# Patient Record
Sex: Male | Born: 1970
Health system: Southern US, Community
[De-identification: ages and names within clinical notes are randomized; demographics above are authoritative.]

## PROBLEM LIST (undated history)

## (undated) DIAGNOSIS — E119 Type 2 diabetes mellitus without complications: Secondary | ICD-10-CM

## (undated) DIAGNOSIS — K2270 Barrett's esophagus without dysplasia: Secondary | ICD-10-CM

## (undated) DIAGNOSIS — M1990 Unspecified osteoarthritis, unspecified site: Secondary | ICD-10-CM

## (undated) DIAGNOSIS — F32A Depression, unspecified: Secondary | ICD-10-CM

## (undated) DIAGNOSIS — R7303 Prediabetes: Secondary | ICD-10-CM

## (undated) DIAGNOSIS — F419 Anxiety disorder, unspecified: Secondary | ICD-10-CM

## (undated) DIAGNOSIS — E785 Hyperlipidemia, unspecified: Secondary | ICD-10-CM

## (undated) HISTORY — DX: Prediabetes: R73.03

## (undated) HISTORY — DX: Barrett's esophagus without dysplasia: K22.70

## (undated) HISTORY — DX: Type 2 diabetes mellitus without complications: E11.9

## (undated) HISTORY — DX: Anxiety disorder, unspecified: F41.9

## (undated) HISTORY — DX: Unspecified osteoarthritis, unspecified site: M19.90

## (undated) HISTORY — DX: Hyperlipidemia, unspecified: E78.5

## (undated) HISTORY — DX: Depression, unspecified: F32.A

## (undated) HISTORY — PX: LITHOTRIPSY: SUR834

---

## 2001-04-08 ENCOUNTER — Encounter: Payer: Self-pay | Admitting: Internal Medicine

## 2001-04-08 ENCOUNTER — Emergency Department (HOSPITAL_COMMUNITY): Admission: EM | Admit: 2001-04-08 | Discharge: 2001-04-08 | Payer: Self-pay | Admitting: Internal Medicine

## 2005-12-23 ENCOUNTER — Emergency Department: Payer: Self-pay | Admitting: Emergency Medicine

## 2006-08-24 HISTORY — PX: COLONOSCOPY: SHX174

## 2006-11-22 ENCOUNTER — Ambulatory Visit: Payer: Self-pay | Admitting: Gastroenterology

## 2007-06-28 ENCOUNTER — Ambulatory Visit: Payer: Self-pay

## 2008-08-24 HISTORY — PX: ESOPHAGOGASTRODUODENOSCOPY: SHX1529

## 2008-08-24 LAB — HM COLONOSCOPY

## 2008-11-16 ENCOUNTER — Ambulatory Visit: Payer: Self-pay | Admitting: Gastroenterology

## 2011-10-06 ENCOUNTER — Emergency Department: Payer: Self-pay | Admitting: Emergency Medicine

## 2011-10-06 LAB — CBC
HCT: 48.9 % (ref 40.0–52.0)
HGB: 16.9 g/dL (ref 13.0–18.0)
MCH: 32.3 pg (ref 26.0–34.0)
MCHC: 34.5 g/dL (ref 32.0–36.0)
MCV: 94 fL (ref 80–100)
Platelet: 151 10*3/uL (ref 150–440)
RBC: 5.21 10*6/uL (ref 4.40–5.90)
RDW: 12.5 % (ref 11.5–14.5)
WBC: 9.8 10*3/uL (ref 3.8–10.6)

## 2011-10-06 LAB — BASIC METABOLIC PANEL
Anion Gap: 6 — ABNORMAL LOW (ref 7–16)
BUN: 14 mg/dL (ref 7–18)
Calcium, Total: 9.3 mg/dL (ref 8.5–10.1)
Chloride: 104 mmol/L (ref 98–107)
Co2: 27 mmol/L (ref 21–32)
Creatinine: 0.79 mg/dL (ref 0.60–1.30)
EGFR (African American): >60
EGFR (Non-African Amer.): >60
Glucose: 95 mg/dL (ref 65–99)
Osmolality: 274 (ref 275–301)
Potassium: 4 mmol/L (ref 3.5–5.1)
Sodium: 137 mmol/L (ref 136–145)

## 2011-10-06 LAB — URINALYSIS, COMPLETE
Bacteria: NONE SEEN
Bilirubin,UR: NEGATIVE
Glucose,UR: NEGATIVE mg/dL (ref 0–75)
Ketone: NEGATIVE
Leukocyte Esterase: NEGATIVE
Nitrite: NEGATIVE
Ph: 6 (ref 4.5–8.0)
Protein: NEGATIVE
RBC,UR: 1153 /HPF (ref 0–5)
Specific Gravity: 1.019 (ref 1.003–1.030)
Squamous Epithelial: 1
WBC UR: 2 /HPF (ref 0–5)

## 2011-10-08 ENCOUNTER — Emergency Department: Payer: Self-pay | Admitting: Emergency Medicine

## 2011-10-08 LAB — BASIC METABOLIC PANEL
Anion Gap: 11 (ref 7–16)
BUN: 14 mg/dL (ref 7–18)
Calcium, Total: 9.1 mg/dL (ref 8.5–10.1)
Chloride: 104 mmol/L (ref 98–107)
Co2: 25 mmol/L (ref 21–32)
Creatinine: 0.92 mg/dL (ref 0.60–1.30)
EGFR (African American): >60
EGFR (Non-African Amer.): >60
Glucose: 127 mg/dL — ABNORMAL HIGH (ref 65–99)
Osmolality: 281 (ref 275–301)
Potassium: 3.7 mmol/L (ref 3.5–5.1)
Sodium: 140 mmol/L (ref 136–145)

## 2011-10-08 LAB — URINALYSIS, COMPLETE
Bacteria: NONE SEEN
Bilirubin,UR: NEGATIVE
Glucose,UR: NEGATIVE mg/dL (ref 0–75)
Ketone: NEGATIVE
Leukocyte Esterase: NEGATIVE
Nitrite: NEGATIVE
Ph: 6 (ref 4.5–8.0)
Protein: NEGATIVE
RBC,UR: 1 /HPF (ref 0–5)
Specific Gravity: 1.006 (ref 1.003–1.030)
Squamous Epithelial: NONE SEEN
WBC UR: 2 /HPF (ref 0–5)

## 2011-10-12 ENCOUNTER — Ambulatory Visit: Payer: Self-pay | Admitting: Urology

## 2011-10-26 ENCOUNTER — Ambulatory Visit: Payer: Self-pay | Admitting: Urology

## 2011-10-28 ENCOUNTER — Ambulatory Visit: Payer: Self-pay | Admitting: Urology

## 2013-03-11 IMAGING — CT CT STONE STUDY
1 of 2 series · 16 of 32 positions shown, 20 images · non-contrast
Comparison: none

REASON FOR EXAM: left flank pain
COMMENTS:

PROCEDURE:     CT  - CT ABDOMEN /PELVIS WO (STONE)  - October 06, 2011  [DATE]
RESULT:     History: Left flank pain.
Comparison Study: No prior.

[Series 2: stone · axial · 0.79mm/px · z∈[-1012,-562]mm · 16 of 162 slices shown, 20 images]
[im 6/162  soft-tissue]
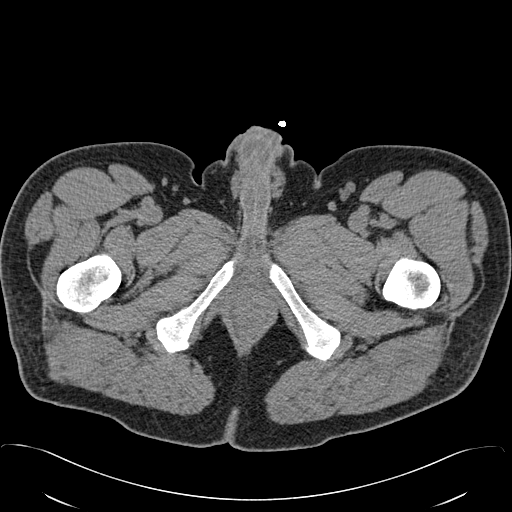
[im 6/162  bone]
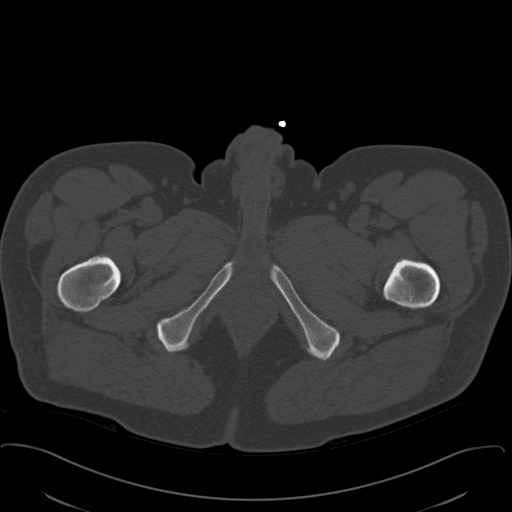
[im 18/162  soft-tissue]
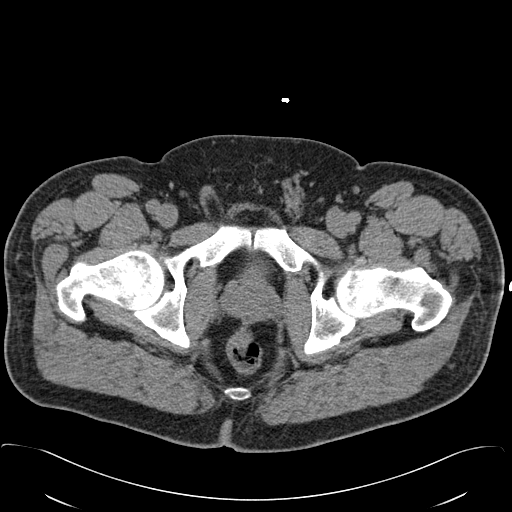
[im 30/162  soft-tissue]
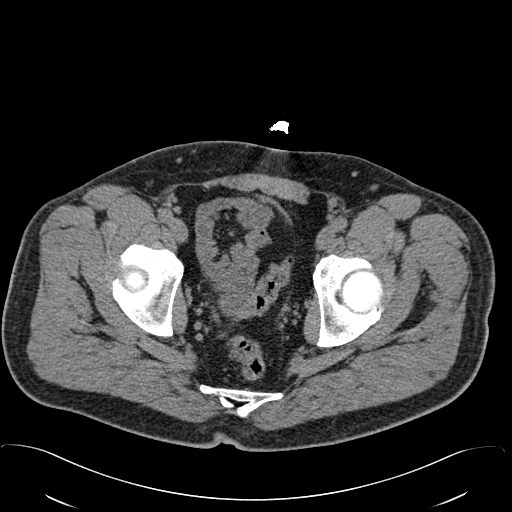
[im 42/162  soft-tissue]
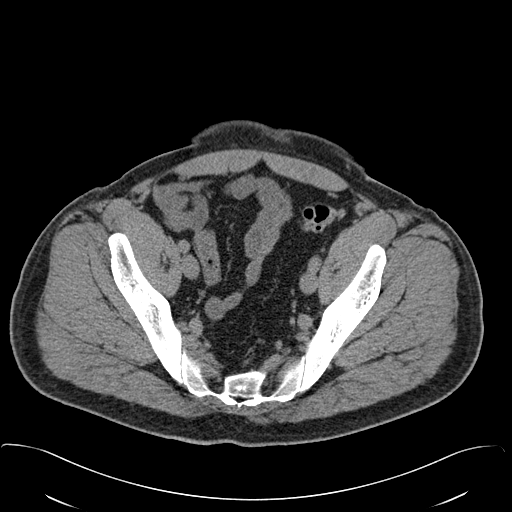
[im 54/162  soft-tissue]
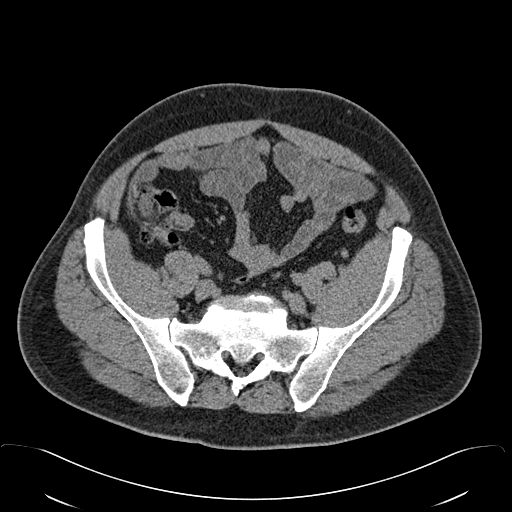
[im 66/162  soft-tissue]
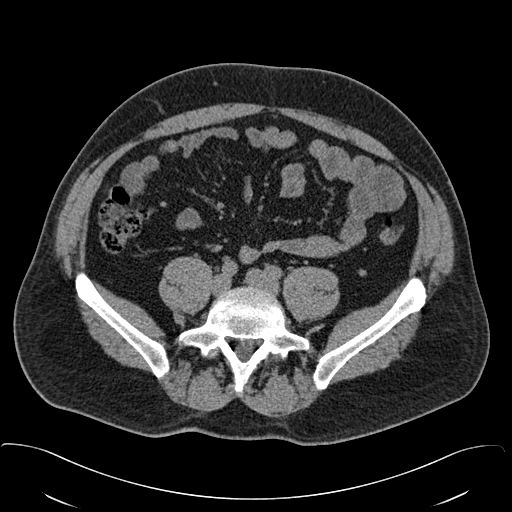
[im 78/162  soft-tissue]
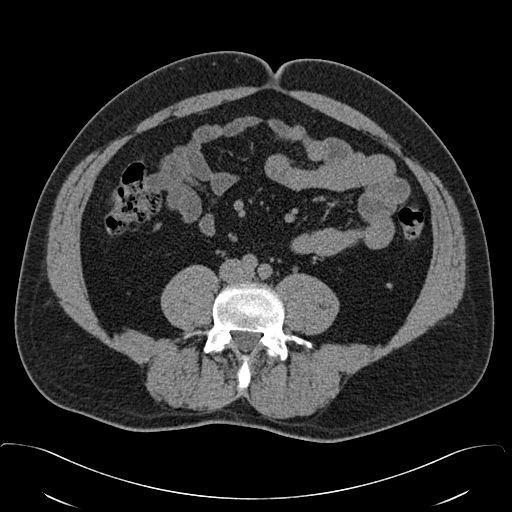
[im 84/162  soft-tissue]
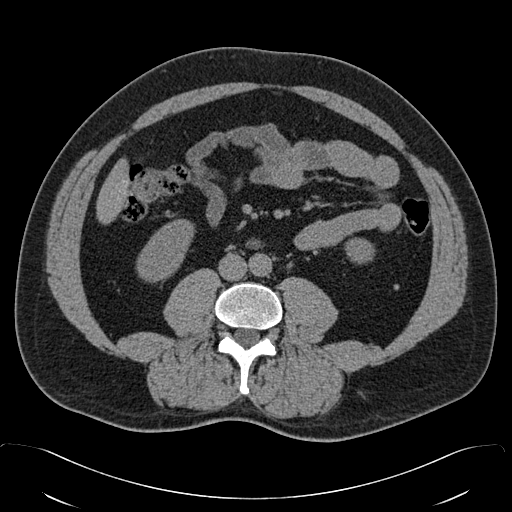
[im 96/162  soft-tissue]
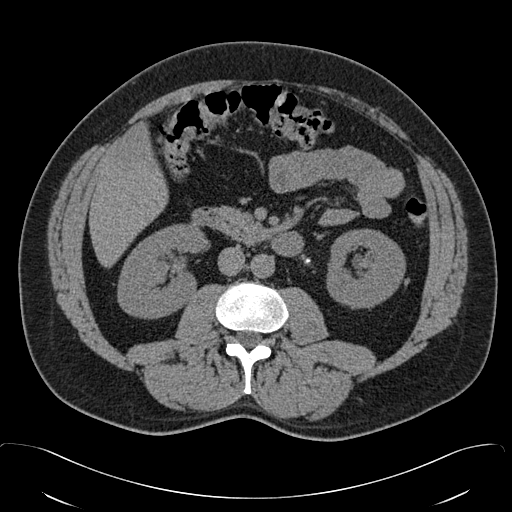
[im 96/162  bone]
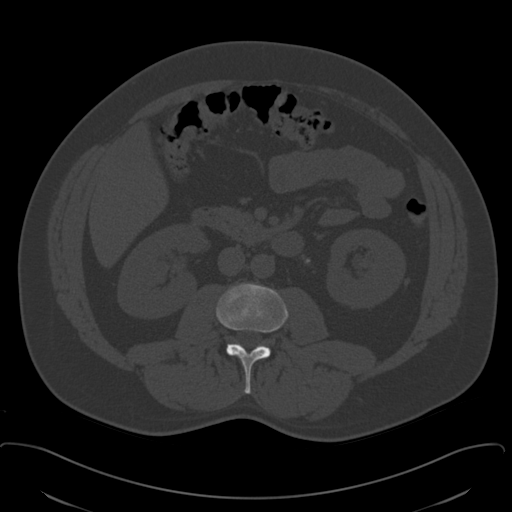
[im 108/162  soft-tissue]
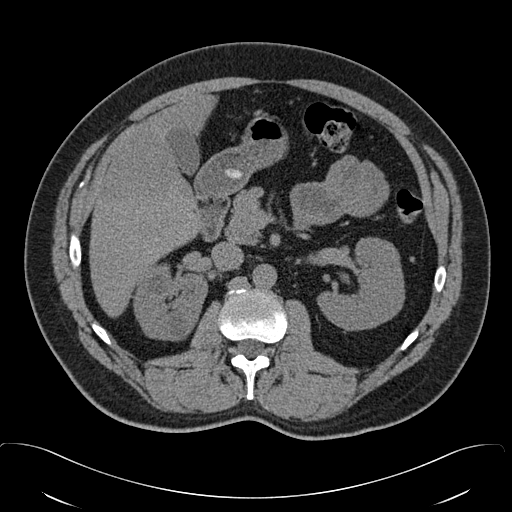
[im 120/162  soft-tissue]
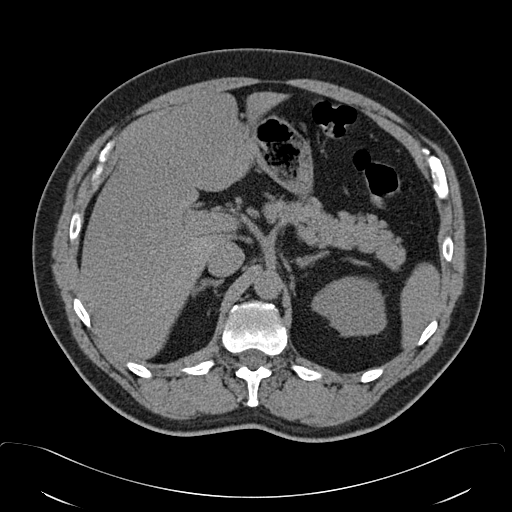
[im 132/162  soft-tissue]
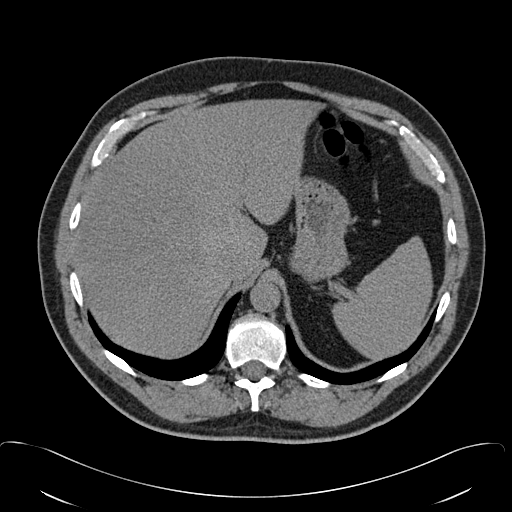
[im 138/162  lung]
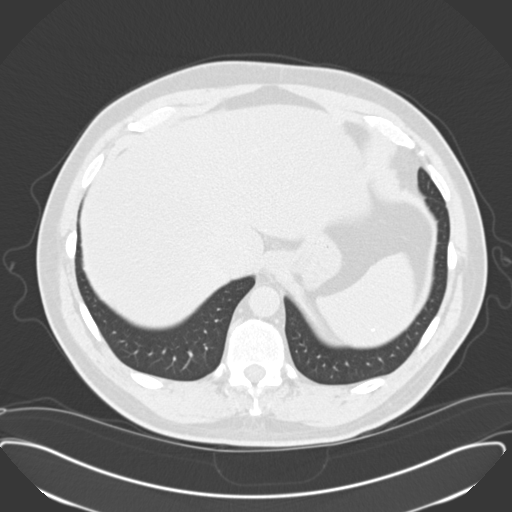
[im 144/162  soft-tissue]
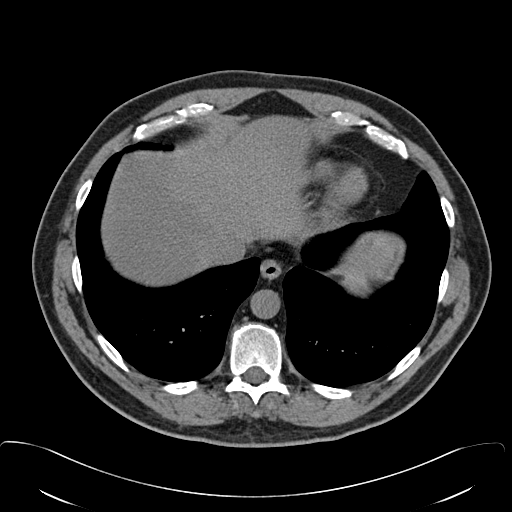
[im 144/162  lung]
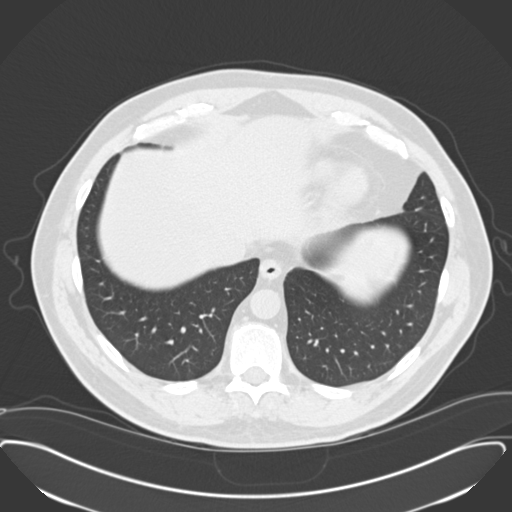
[im 150/162  lung]
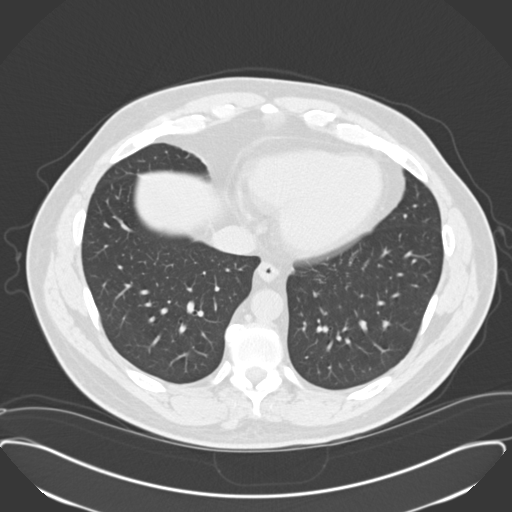
[im 156/162  soft-tissue]
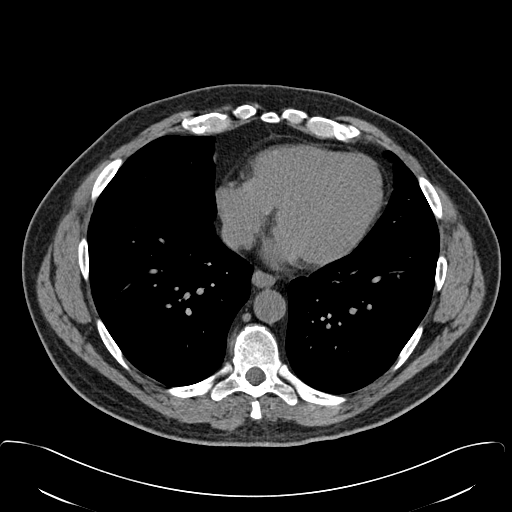
[im 156/162  lung]
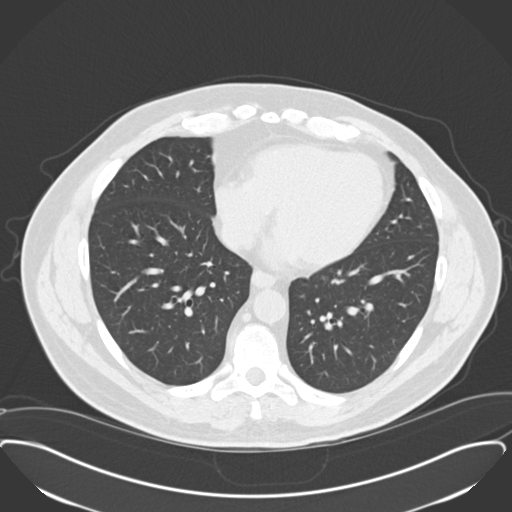

[16 of 32 positions shown; findings below may reference images not displayed]

FINDINGS: Standard nonenhanced CT obtained. Liver normal. Gallbladder
nondistended. Pancreas normal. Calcified splenic granulomas noted. Adrenals
normal. Simple renal cysts are noted. Cortical scarring with calcification
noted in the left kidney. Left nephrolithiasis present. Aorta nondistended.
2 mm stone in the left upper ureter is noted. There is mild left
hydronephrosis. No bowel distention noted. Appendix normal. Lung bases
clear. No free air. Calcified splenic granulomas. No bowel distention. Small
inguinal lymph nodes are noted. These are nonspecific.
IMPRESSION: Small 5 mm stone proximal left ureter with mild
hydronephrosis. Left nephrolithiasis. Bilateral renal cysts.

## 2013-03-31 IMAGING — CR DG ABDOMEN 1V
1 series · 2 of 2 positions shown · non-contrast
Comparison: none

REASON FOR EXAM: nephrolithasis
COMMENTS:

PROCEDURE:     DXR - DXR KIDNEY URETER BLADDER  - October 26, 2011  [DATE]
RESULT:     Comparison is made to the study 12 October, 2011.
No definite renal or ureteral calculi are appreciated. The bowel gas pattern
is unremarkable. The bony structures appear grossly normal.

[Series 4: t abdomen supine · 0.14mm/px · 2 of 2 slices shown]
[im 1/2]
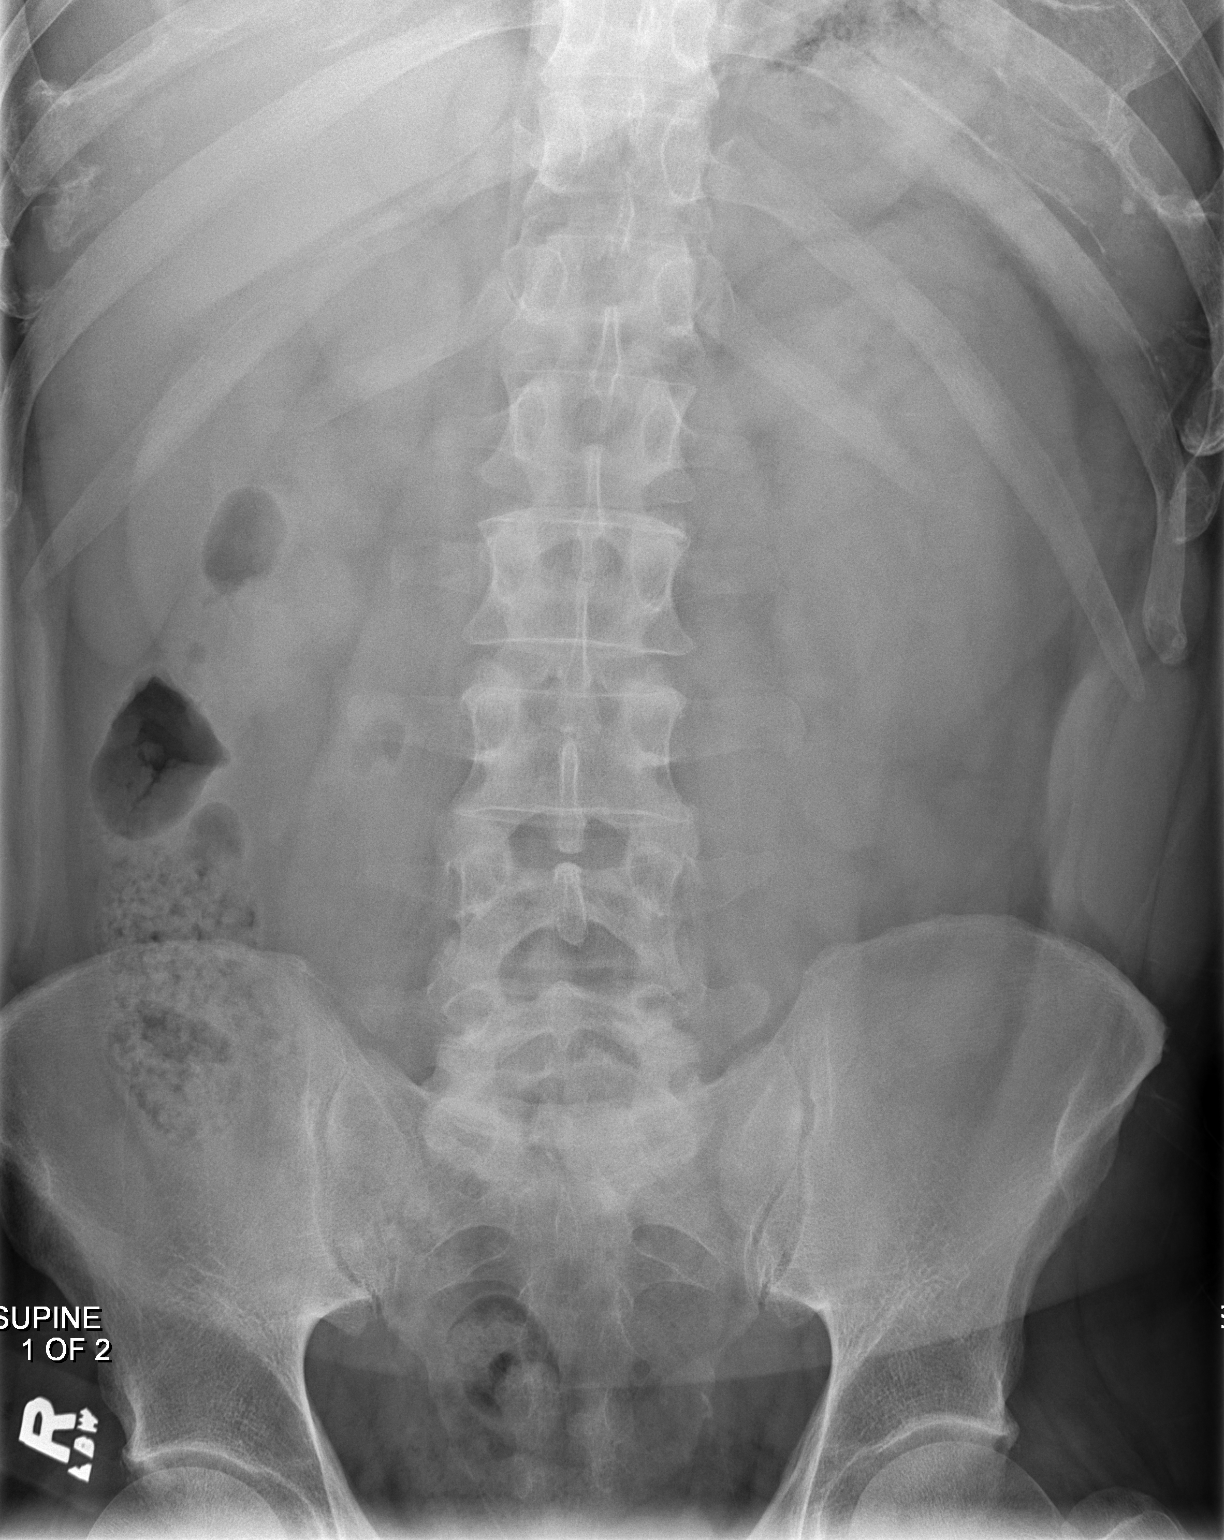
[im 2/2]
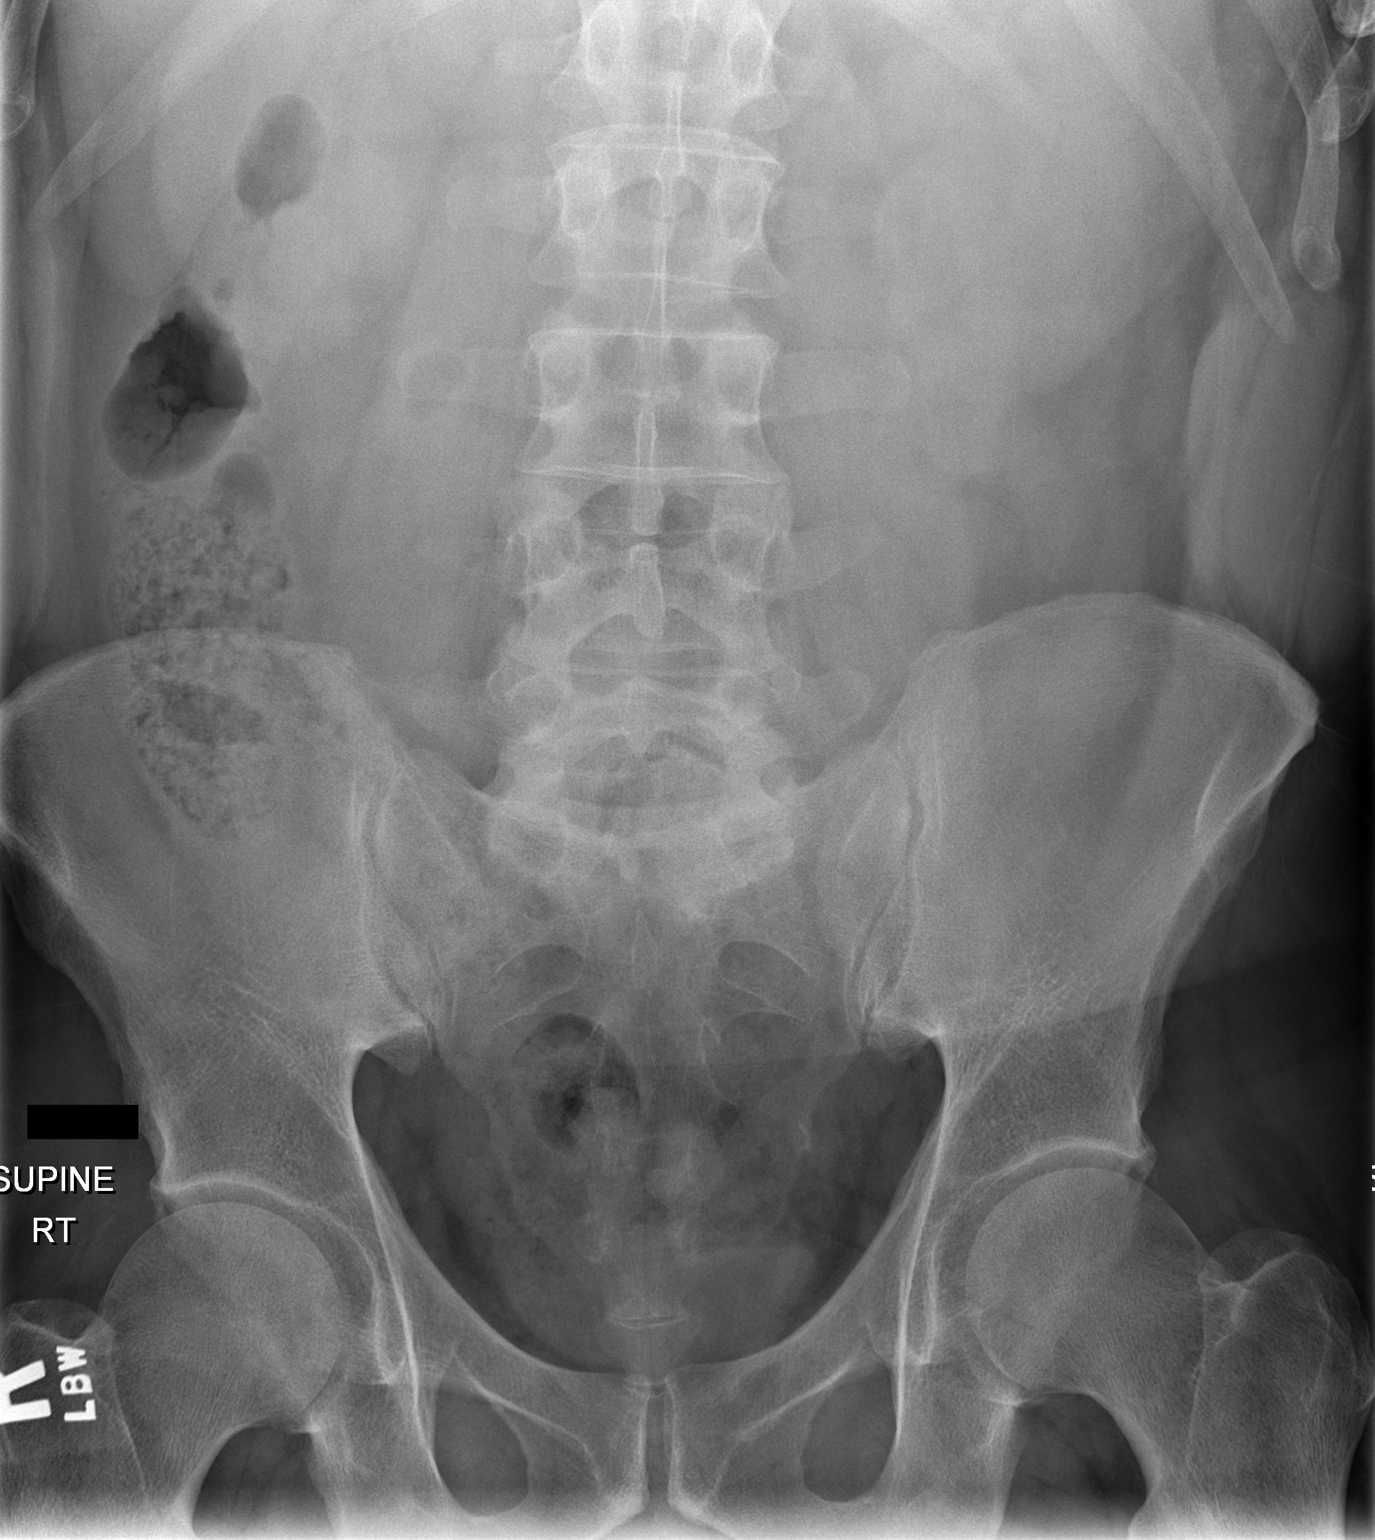

[2 of 2 positions shown; findings below may reference images not displayed]

IMPRESSION: 1. No evidence of urinary tract stones.

ADDENDUM:  10/29/2011- Ordering physician is Babey Diop, M.D.

## 2013-11-23 ENCOUNTER — Ambulatory Visit: Payer: Self-pay | Admitting: Internal Medicine

## 2014-11-26 LAB — PSA: PSA: 0.4

## 2014-12-16 NOTE — Op Note (Signed)
PATIENT NAME:  Karl Barrett, [name] MR#:  [id number] DATE OF BIRTH:  Sep 23, 1970  DATE OF PROCEDURE:  10/28/2011  PREOPERATIVE DIAGNOSES:  1. Left distal ureteral calculus.  2. Left renal colic.   POSTOPERATIVE DIAGNOSES:  1. Left distal ureteral calculus.  2. Left renal colic.   PROCEDURES:  1. Left ureteroscopy with laser lithotripsy/stone extraction.  2. Placement of left ureteral stent.   SURGEON: Irineo AxonScott Mayur Duman, M.D.   ASSISTANT: None.   ANESTHESIA: General.   INDICATIONS: This is a 44 year old male with approximately one month history of intermittent left flank pain. He has had increasing severe pain in the past 48 hours. The stone appears to have migrated to the distal ureter. After discussion of treatment options, he has elected ureteroscopic removal.   DESCRIPTION OF PROCEDURE: The patient was taken to the Operating Room where a general anesthetic was administered. He was placed in the low lithotomy position and his external genitalia were prepped and draped sterilely. A time out was performed and a 21 French cystoscope with 30 degree lens was lubricated and passed under direct vision. The urethra was normal in caliber without stricture. The prostate shows mild lateral lobe enlargement. The bladder mucosa was closely inspected and there was no erythema, solid or papillary lesions noted. There was clear efflux from the right ureteral orifice. No efflux was seen from the left ureteral orifice. A 0.035 guidewire was placed through the cystoscope and into the left ureteral orifice. The wire was negotiated past the stone and passed up the renal pelvis. The cystoscope was removed and a 6 French semirigid ureteroscope was lubricated and passed under direct vision. A second 0.025 guidewire was placed through the ureteroscope and into the left ureteral orifice and the scope was advanced over the wire. In the midportion of the distal ureter, a stone was identified and appeared to be embedded in a portion  of the mucosa with some surrounding ureteral edema. A 365 micron holmium laser fiber was placed through the ureteroscope. The stone was easily fragmented at a power of 6.4 watts. Total energy used was 0.3 kilojoules. Stone fragments were removed with a 3 [place] nitinol basket. There was mucosal edema. No ureteral perforation was noted. A 6 French/26 cm Bard inlay stent was placed. There was good curl seen in the renal pelvis on fluoroscopy. The distal end of the stent was well positioned in the bladder. The stent was left attached to a string and will be removed at a later date. A B and O suppository was placed per rectum. He was taken to PAC-U in stable condition. There were no complications. EBL was minimal.  ____________________________ Verna CzechScott C. Lonna CobbStoioff, MD scs:slb D: 10/28/2011 14:18:36 ET T: 10/28/2011 16:02:42 ET JOB#: [id number]  cc: Lorin PicketScott C. Lonna CobbStoioff, MD, <Dictator> Riki AltesSCOTT C Shaquanna Lycan MD ELECTRONICALLY SIGNED 11/02/2011 18:16

## 2015-02-19 ENCOUNTER — Encounter: Payer: Self-pay | Admitting: Internal Medicine

## 2015-02-19 DIAGNOSIS — K22719 Barrett's esophagus with dysplasia, unspecified: Secondary | ICD-10-CM | POA: Insufficient documentation

## 2015-02-19 DIAGNOSIS — K277 Chronic peptic ulcer, site unspecified, without hemorrhage or perforation: Secondary | ICD-10-CM | POA: Insufficient documentation

## 2015-02-19 DIAGNOSIS — G5600 Carpal tunnel syndrome, unspecified upper limb: Secondary | ICD-10-CM | POA: Insufficient documentation

## 2015-02-19 DIAGNOSIS — E118 Type 2 diabetes mellitus with unspecified complications: Secondary | ICD-10-CM | POA: Insufficient documentation

## 2015-02-19 DIAGNOSIS — M1710 Unilateral primary osteoarthritis, unspecified knee: Secondary | ICD-10-CM | POA: Insufficient documentation

## 2015-02-19 DIAGNOSIS — E1165 Type 2 diabetes mellitus with hyperglycemia: Secondary | ICD-10-CM

## 2015-02-19 DIAGNOSIS — A6000 Herpesviral infection of urogenital system, unspecified: Secondary | ICD-10-CM | POA: Insufficient documentation

## 2015-02-19 DIAGNOSIS — M179 Osteoarthritis of knee, unspecified: Secondary | ICD-10-CM | POA: Insufficient documentation

## 2015-04-09 ENCOUNTER — Ambulatory Visit: Payer: Self-pay | Admitting: Internal Medicine

## 2015-04-29 IMAGING — CR DG CHEST 2V
1 series · 2 of 2 positions shown · non-contrast
Comparison: None.

CLINICAL DATA: Cough

EXAM:
CHEST  2 VIEW

[Series 1: pa · 0.17mm/px · 2 of 2 slices shown]
[im 1/2]
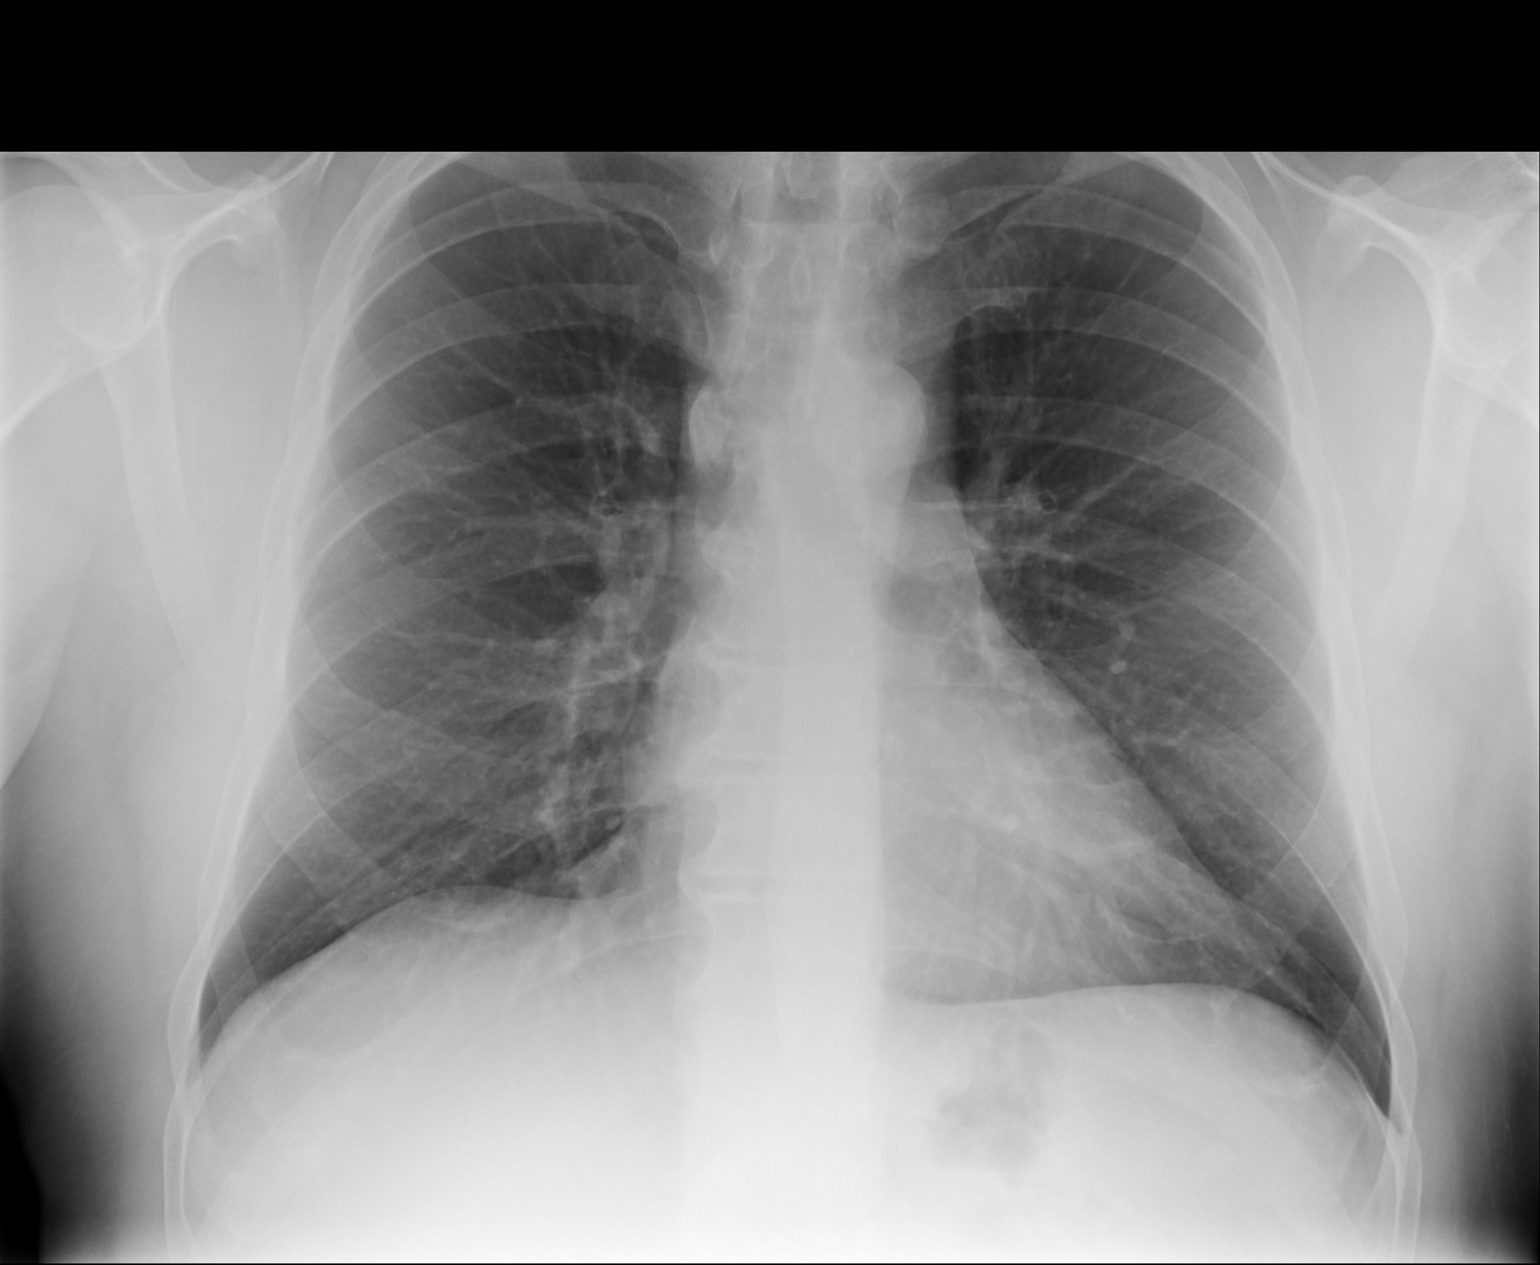
[im 2/2]
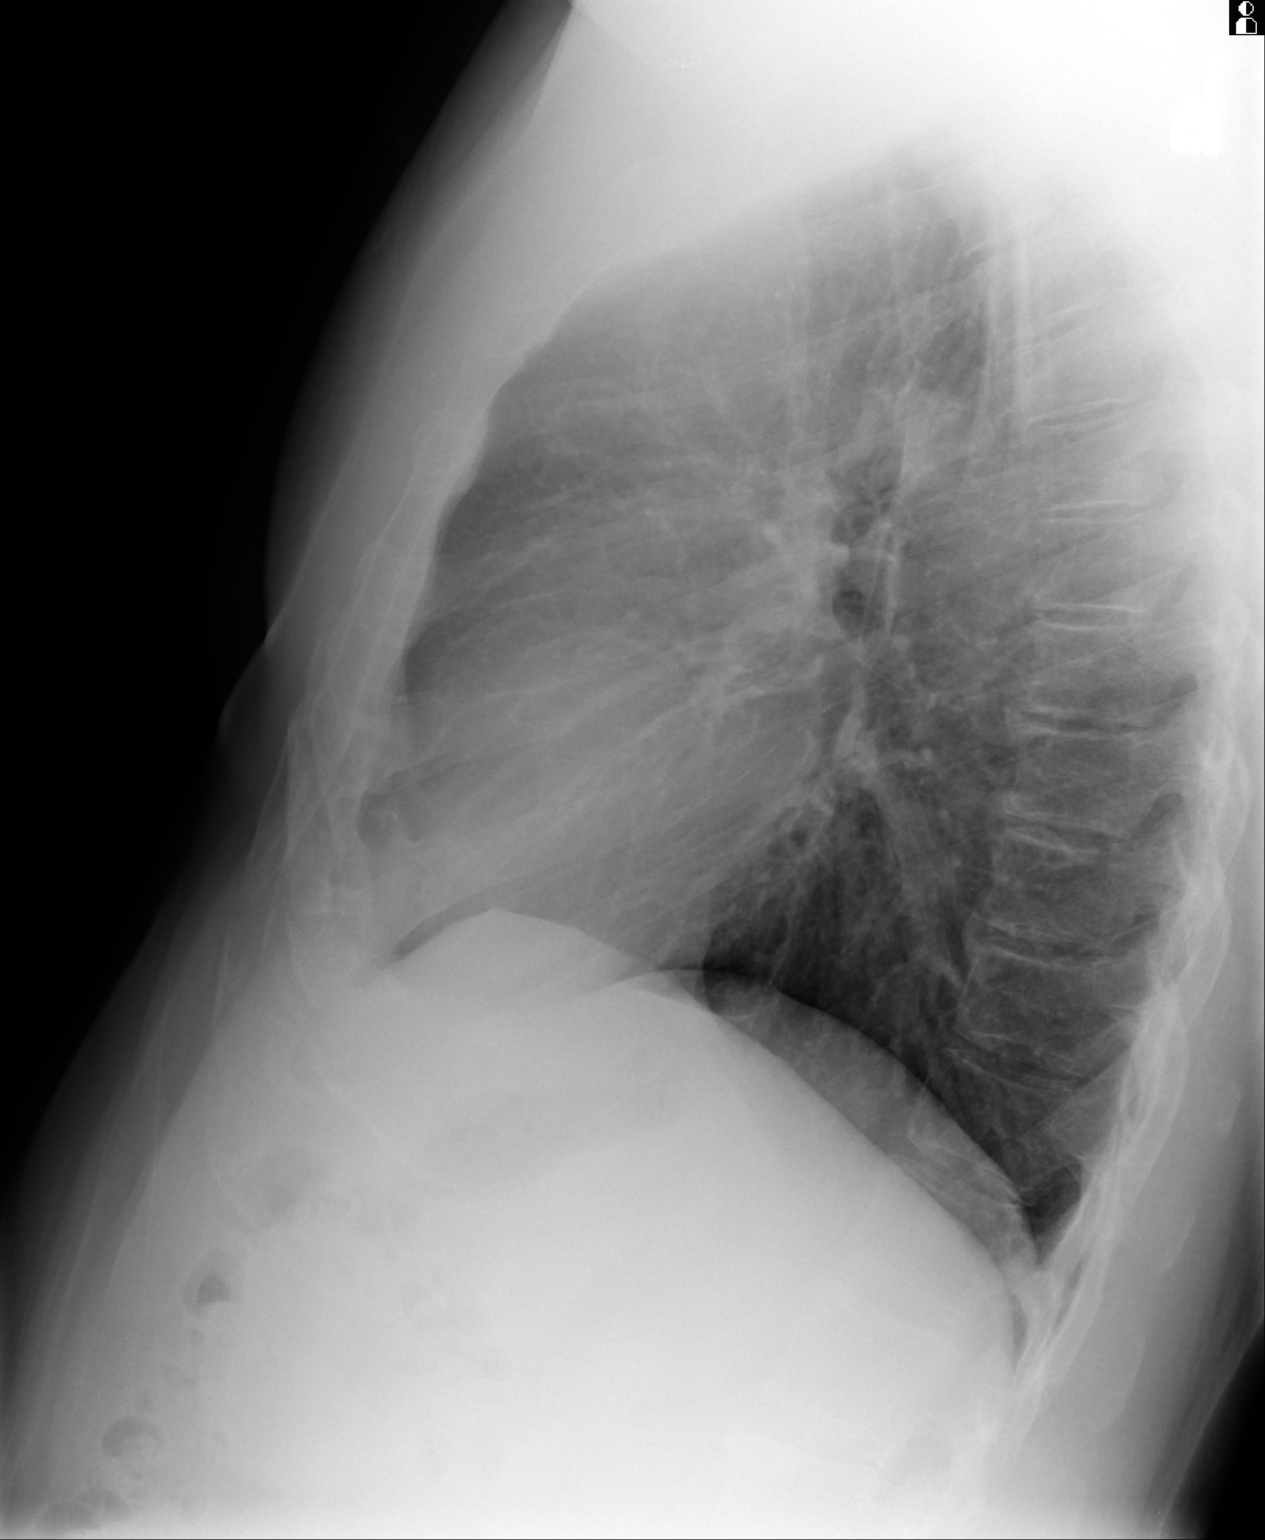

[2 of 2 positions shown; findings below may reference images not displayed]

FINDINGS: The heart size and mediastinal contours are within normal limits.
Both lungs are clear. The visualized skeletal structures are
unremarkable.
IMPRESSION: No active cardiopulmonary disease.

## 2015-05-15 ENCOUNTER — Ambulatory Visit: Payer: Self-pay | Admitting: Internal Medicine

## 2015-12-04 ENCOUNTER — Ambulatory Visit (INDEPENDENT_AMBULATORY_CARE_PROVIDER_SITE_OTHER): Payer: BLUE CROSS/BLUE SHIELD | Admitting: Internal Medicine

## 2015-12-04 ENCOUNTER — Encounter: Payer: Self-pay | Admitting: Internal Medicine

## 2015-12-04 ENCOUNTER — Other Ambulatory Visit: Payer: Self-pay | Admitting: Internal Medicine

## 2015-12-04 VITALS — BP 138/78 | HR 74 | Temp 98.1°F | Resp 16 | Ht 73.0 in | Wt 242.0 lb

## 2015-12-04 DIAGNOSIS — Z0000 Encounter for general adult medical examination without abnormal findings: Secondary | ICD-10-CM

## 2015-12-04 DIAGNOSIS — J329 Chronic sinusitis, unspecified: Secondary | ICD-10-CM

## 2015-12-04 DIAGNOSIS — Z720 Tobacco use: Secondary | ICD-10-CM | POA: Insufficient documentation

## 2015-12-04 DIAGNOSIS — K2270 Barrett's esophagus without dysplasia: Secondary | ICD-10-CM

## 2015-12-04 DIAGNOSIS — Z125 Encounter for screening for malignant neoplasm of prostate: Secondary | ICD-10-CM | POA: Diagnosis not present

## 2015-12-04 DIAGNOSIS — E119 Type 2 diabetes mellitus without complications: Secondary | ICD-10-CM

## 2015-12-04 DIAGNOSIS — M722 Plantar fascial fibromatosis: Secondary | ICD-10-CM | POA: Insufficient documentation

## 2015-12-04 NOTE — Progress Notes (Signed)
Date:  12/04/2015   Name:  Karl Barrett   DOB:  [birth year]   MRN:  [id number]   Chief Complaint: Annual Exam Karl Barrett is a 45 y.o. male who presents today for his Complete Annual Exam. He feels fairly well. He reports exercising mostly at work. He reports he is sleeping well.   Diabetes He presents for his follow-up diabetic visit. He has type 2 (A1C last year 6.8) diabetes mellitus. Pertinent negatives for hypoglycemia include no dizziness or headaches. Pertinent negatives for diabetes include no chest pain and no fatigue. Current diabetic treatment includes diet (stopped metformin due to severe stomach upset).  he does not check his blood sugars but he has a meter.  Barrett's Eso - he continues on nexium over the counter daily.  It has been about three years since his last EGD with Dr. Marva Panda.  He denies significant symptoms - mostly reflux if he eats too late.  Foot pain - he has persistent pain in the bottoms of both feet.  He changed to an insert that helped for a while.  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  Sinus congestion - he complains of chronic sinus congestion.  Flonase was not helpful.  He gets blocked up if he lies down.  In the morning he has thick yellow mucus and post nasal drainage that he has to cough up.  He has never seen an ENT.  He continues to smoke.  Tobacco use - he is on bupropion to help quit - he is taking only one a day and it has helped his mood.  He is almost ready to quit completely and will increase medications to twice a day at that time.  Review of Systems  Constitutional: Negative for chills, diaphoresis, appetite change, fatigue and unexpected weight change.  HENT: Positive for congestion, postnasal drip and sinus pressure. Negative for hearing loss, nosebleeds, tinnitus, trouble swallowing and voice change.   Eyes: Negative for visual disturbance.  Respiratory: Negative for choking, shortness of breath and  wheezing.   Cardiovascular: Negative for chest pain, palpitations and leg swelling.  Gastrointestinal: Negative for abdominal pain, diarrhea, constipation and blood in stool.  Genitourinary: Negative for dysuria, frequency and difficulty urinating.  Musculoskeletal: Positive for arthralgias (plantar fasciitis and left ankle arthritis). Negative for myalgias and back pain.  Skin: Negative for color change and rash.  Allergic/Immunologic: Negative for environmental allergies.  Neurological: Negative for dizziness, syncope and headaches.  Hematological: Negative for adenopathy.  Psychiatric/Behavioral: Negative for sleep disturbance and dysphoric mood.    Patient Active Problem List   Diagnosis Date Noted  . Barrett esophagus 02/19/2015  . Carpal tunnel syndrome 02/19/2015  . Chronic peptic ulcer 02/19/2015  . Controlled diabetes mellitus type II without complication (HCC) 02/19/2015  . Arthritis of knee, degenerative 02/19/2015  . Recurrent genital herpes 02/19/2015    Prior to Admission medications   Medication Sig Start Date End Date Taking? Authorizing Provider  buPROPion (WELLBUTRIN SR) 150 MG 12 hr tablet Take 1 tablet by mouth 2 (two) times daily. 11/26/14  Yes Historical Provider, MD  esomeprazole (NEXIUM) 40 MG capsule Take 1 tablet by mouth daily. 11/17/12  Yes Historical Provider, MD  valACYclovir (VALTREX) 1000 MG tablet Take 1 tablet by mouth daily. 11/26/14  Yes Historical Provider, MD  fluticasone (FLONASE) 50 MCG/ACT nasal spray Place 2 sprays into the nose daily. Reported on 12/04/2015 11/23/13   Historical Provider, MD    No Known  Allergies  Past Surgical History  Procedure Laterality Date  . Lithotripsy      Social History  Substance Use Topics  . Smoking status: Current Every Day Smoker  . Smokeless tobacco: None  . Alcohol Use: 0.0 oz/week    0 Standard drinks or equivalent per week     Medication list has been reviewed and updated.   Physical Exam    Constitutional: He is oriented to person, place, and time. He appears well-developed and well-nourished.  HENT:  Head: Normocephalic.  Right Ear: External ear and ear canal normal. Tympanic membrane is retracted. Tympanic membrane is not erythematous.  Left Ear: External ear and ear canal normal. Tympanic membrane is retracted. Tympanic membrane is not erythematous.  Nose: Right sinus exhibits maxillary sinus tenderness. Left sinus exhibits maxillary sinus tenderness.  Mouth/Throat: Uvula is midline and oropharynx is clear and moist.  Eyes: Conjunctivae and EOM are normal. Pupils are equal, round, and reactive to light.  Neck: Normal range of motion. Neck supple. Carotid bruit is not present. No thyromegaly present.  Cardiovascular: Normal rate, regular rhythm, normal heart sounds and intact distal pulses.   Pulmonary/Chest: Effort normal and breath sounds normal. He has no wheezes. Right breast exhibits no mass. Left breast exhibits no mass.  Abdominal: Soft. Normal appearance and bowel sounds are normal. There is no hepatosplenomegaly. There is no tenderness.  Musculoskeletal: Normal range of motion.  Tender on plantar aspect of both feet c/w plantar fasciitis  Lymphadenopathy:    He has no cervical adenopathy.  Neurological: He is alert and oriented to person, place, and time. He has normal reflexes.  Foot exam - normal skin, nails, pulses and sensation  Skin: Skin is warm, dry and intact.  Psychiatric: He has a normal mood and affect. His speech is normal and behavior is normal. Judgment and thought content normal.  Nursing note and vitals reviewed.   BP 138/78 mmHg  Pulse 74  Temp(Src) 98.1 F (36.7 C)  Resp 16  Ht 6\' 1"  (1.854 m)  Wt 242 lb (109.77 kg)  BMI 31.93 kg/m2  SpO2 98%  Assessment and Plan: 1. Annual physical exam Discussed diet and exercise  2. Controlled type 2 diabetes mellitus without complication, without long-term current use of insulin (HCC) Will advise  on medication when labs return Begin checking blood sugars daily and record - Comprehensive metabolic panel - Hemoglobin A1c - Lipid panel - Microalbumin / creatinine urine ratio  3. Barrett esophagus Due for follow up with GI Continue Nexium - CBC with Differential/Platelet  4. Tobacco use Continue bupropion -  5. Chronic sinusitis, unspecified location No benefit from flonase - Ambulatory referral to ENT  6. Plantar fasciitis - Ambulatory referral to Podiatry  7. Prostate cancer screening DRE deferred due to lack of symptoms - PSA   Bari EdwardLaura Berglund, MD [facility]  12/04/2015

## 2015-12-04 NOTE — Patient Instructions (Addendum)
Begin testing blood sugar every morning and record.    Schedule annual eye exam - very important when you are diabetic.  Work on quitting smoking.  Call Dr. Marva PandaSkulskie for follow up of Barrett's esophagus.  Diabetic Retinopathy Diabetic retinopathy is a disease of the light-sensitive membrane at the back of the eye (retina). It is a complication of diabetes and a common cause of blindness. Early detection of the disease is key to keeping your eyes healthy.  CAUSES  Diabetic retinopathy is caused by blood sugar (glucose) levels that are too high over an extended period of time. High blood sugars cause damage to the small blood vessels of the retina, allowing blood to leak through the vessel walls. This causes visual impairment and eventually blindness. RISK FACTORS  High blood pressure.  Having diabetes for a long time.  Having poorly controlled blood sugars. SIGNS AND SYMPTOMS  In the early stages of diabetic retinopathy, there are often no symptoms. As the condition advances, symptoms may include:  Blurred vision. This is usually caused by a swelling due to abnormal blood glucose levels. The blurriness may go away when blood glucose levels return to normal.  Moving specks or dark spots (floaters) in your vision. These can be caused by a small retinal hemorrhage. A hemorrhage is bleeding from blood vessels.  Missing parts of your field of vision, such as things at the side. This can be caused by larger retinal hemorrhages.  Difficulty reading books or signs.  Double vision.  Pain in one or both eyes.  Feeling pressure in one or both eyes.  Trouble seeing straight lines. Straight lines do not look straight.  Redness of the eyes that does not go away. DIAGNOSIS  Your eye care specialist can detect changes in the blood vessels of your eye by putting drops in your eyes that enlarge (dilate) your pupils. This allows your eye care specialist to get a good look at your retina to see if  there are any changes that have occurred as a result of your diabetes. You should have your eyes examined once a year. TREATMENT  Your eye care specialist may use a special laser beam to seal the blood vessels of the retina and stop them from leaking. Early detection and treatment are important so that further damage to your eyes can be prevented. In addition, managing your blood sugars and keeping them in the target range can slow the progress of the disease. HOME CARE INSTRUCTIONS   Keep your blood pressure within your target range.  Keep your blood glucose levels within your target range.  Follow your health care provider's instructions regarding diet and other means for controlling your blood glucose levels.  Check your blood levels for glucose as recommended by your health care provider.  Keep regular appointments with your eye specialist. An eye specialist can usually see diabetic retinopathy developing long before it starts causing problems. In many cases, it can be treated to prevent complications from occurring. If you have diabetes, you should have your eyes checked at least every year. Your risk of retinopathy increases the longer you have the disease.  If you smoke, quit. Ask your health care provider for help if needed. Smoking can make retinopathy worse. SEEK MEDICAL CARE IF:   You notice gradual blurring or other changes in your vision over time.  You notice that your glasses or contact lenses do not make things look as sharp as they once did.  You have trouble reading or seeing  details at a distance with either eye.  You notice a sudden change in your vision or notice that parts of your field of vision appear missing or hazy.  You suddenly see moving specks or dark spots in the field of vision of either eye.  You have sudden partial loss of vision in either eye.   This information is not intended to replace advice given to you by your health care provider. Make sure you  discuss any questions you have with your health care provider.   Document Released: 08/07/2000 Document Revised: 05/31/2013 Document Reviewed: 01/30/2013 Elsevier Interactive Patient Education [employer]

## 2015-12-05 LAB — LIPID PANEL
CHOLESTEROL TOTAL: 186 mg/dL (ref 100–199)
Chol/HDL Ratio: 4.5 ratio units (ref 0.0–5.0)
HDL: 41 mg/dL (ref >39)
LDL CALC: 116 mg/dL — AB (ref 0–99)
Triglycerides: 146 mg/dL (ref 0–149)
VLDL Cholesterol Cal: 29 mg/dL (ref 5–40)

## 2015-12-05 LAB — CBC WITH DIFFERENTIAL/PLATELET
BASOS ABS: 0 10*3/uL (ref 0.0–0.2)
Basos: 0 %
EOS (ABSOLUTE): 0.2 10*3/uL (ref 0.0–0.4)
Eos: 3 %
HEMOGLOBIN: 17.1 g/dL (ref 12.6–17.7)
Hematocrit: 50.7 % (ref 37.5–51.0)
Immature Grans (Abs): 0 10*3/uL (ref 0.0–0.1)
Immature Granulocytes: 0 %
LYMPHS ABS: 2.4 10*3/uL (ref 0.7–3.1)
Lymphs: 31 %
MCH: 32.3 pg (ref 26.6–33.0)
MCHC: 33.7 g/dL (ref 31.5–35.7)
MCV: 96 fL (ref 79–97)
MONOCYTES: 8 %
MONOS ABS: 0.6 10*3/uL (ref 0.1–0.9)
Neutrophils Absolute: 4.5 10*3/uL (ref 1.4–7.0)
Neutrophils: 58 %
Platelets: 163 10*3/uL (ref 150–379)
RBC: 5.3 x10E6/uL (ref 4.14–5.80)
RDW: 13.4 % (ref 12.3–15.4)
WBC: 7.8 10*3/uL (ref 3.4–10.8)

## 2015-12-05 LAB — COMPREHENSIVE METABOLIC PANEL
ALBUMIN: 4.5 g/dL (ref 3.5–5.5)
ALK PHOS: 70 IU/L (ref 39–117)
ALT: 46 IU/L — ABNORMAL HIGH (ref 0–44)
AST: 23 IU/L (ref 0–40)
Albumin/Globulin Ratio: 2 (ref 1.2–2.2)
BILIRUBIN TOTAL: 0.8 mg/dL (ref 0.0–1.2)
BUN / CREAT RATIO: 18 (ref 9–20)
BUN: 14 mg/dL (ref 6–24)
CO2: 22 mmol/L (ref 18–29)
Calcium: 9.5 mg/dL (ref 8.7–10.2)
Chloride: 98 mmol/L (ref 96–106)
Creatinine, Ser: 0.76 mg/dL (ref 0.76–1.27)
GFR calc Af Amer: 126 mL/min/{1.73_m2} (ref >59)
GFR calc non Af Amer: 109 mL/min/{1.73_m2} (ref >59)
GLUCOSE: 176 mg/dL — AB (ref 65–99)
Globulin, Total: 2.3 g/dL (ref 1.5–4.5)
Potassium: 4.1 mmol/L (ref 3.5–5.2)
SODIUM: 139 mmol/L (ref 134–144)
Total Protein: 6.8 g/dL (ref 6.0–8.5)

## 2015-12-05 LAB — HEMOGLOBIN A1C
ESTIMATED AVERAGE GLUCOSE: 192 mg/dL
HEMOGLOBIN A1C: 8.3 % — AB (ref 4.8–5.6)

## 2015-12-05 LAB — PSA: PROSTATE SPECIFIC AG, SERUM: 0.3 ng/mL (ref 0.0–4.0)

## 2015-12-07 LAB — MICROALBUMIN / CREATININE URINE RATIO
Creatinine, Urine: 192.4 mg/dL
MICROALB/CREAT RATIO: 10.1 mg/g creat (ref 0.0–30.0)
Microalbumin, Urine: 19.4 ug/mL

## 2015-12-08 ENCOUNTER — Other Ambulatory Visit: Payer: Self-pay | Admitting: Internal Medicine

## 2015-12-08 MED ORDER — CANAGLIFLOZIN 300 MG PO TABS: 300.0000 mg | ORAL_TABLET | Freq: Every day | ORAL | Status: DC

## 2015-12-09 ENCOUNTER — Telehealth: Payer: Self-pay

## 2015-12-09 NOTE — Telephone Encounter (Signed)
Spoke with patient. Patient advised of all results and verbalized understanding. Will call back with any future questions or concerns. MAH  

## 2015-12-09 NOTE — Telephone Encounter (Signed)
-----   Message from Reubin MilanLaura H Berglund, MD sent at 12/08/2015  2:22 PM EDT ----- Blood sugar and diabetes are much worse!  Need to begin medication and start checking blood sugars daily - schedule a follow up appt in 4 months.  All other labs are normal.  [place] [facility] [place] Invokana.

## 2015-12-14 ENCOUNTER — Other Ambulatory Visit: Payer: Self-pay | Admitting: Internal Medicine

## 2016-12-05 ENCOUNTER — Other Ambulatory Visit: Payer: Self-pay | Admitting: Internal Medicine

## 2016-12-08 ENCOUNTER — Encounter: Payer: Self-pay | Admitting: Internal Medicine

## 2016-12-08 ENCOUNTER — Ambulatory Visit (INDEPENDENT_AMBULATORY_CARE_PROVIDER_SITE_OTHER): Payer: BLUE CROSS/BLUE SHIELD | Admitting: Internal Medicine

## 2016-12-08 VITALS — BP 112/64 | HR 76 | Ht 73.0 in | Wt 242.0 lb

## 2016-12-08 DIAGNOSIS — Z87442 Personal history of urinary calculi: Secondary | ICD-10-CM | POA: Diagnosis not present

## 2016-12-08 DIAGNOSIS — Z0000 Encounter for general adult medical examination without abnormal findings: Secondary | ICD-10-CM | POA: Diagnosis not present

## 2016-12-08 DIAGNOSIS — K22719 Barrett's esophagus with dysplasia, unspecified: Secondary | ICD-10-CM

## 2016-12-08 DIAGNOSIS — A6000 Herpesviral infection of urogenital system, unspecified: Secondary | ICD-10-CM

## 2016-12-08 DIAGNOSIS — E1165 Type 2 diabetes mellitus with hyperglycemia: Secondary | ICD-10-CM | POA: Diagnosis not present

## 2016-12-08 DIAGNOSIS — IMO0001 Reserved for inherently not codable concepts without codable children: Secondary | ICD-10-CM

## 2016-12-08 LAB — POC URINALYSIS WITH MICROSCOPIC (NON AUTO)MANUAL RESULT
BACTERIA UA: 0
BILIRUBIN UA: NEGATIVE
CRYSTALS: 0
Epithelial cells, urine per micros: 0
Glucose, UA: 1000
Ketones, UA: NEGATIVE
LEUKOCYTES UA: NEGATIVE
Mucus, UA: 0
Nitrite, UA: NEGATIVE
Protein, UA: NEGATIVE
RBC: 2 M/uL — AB (ref 4.69–6.13)
Spec Grav, UA: 1.01 (ref 1.010–1.025)
UROBILINOGEN UA: 0.2 U/dL
WBC CASTS UA: 0
pH, UA: 6 (ref 5.0–8.0)

## 2016-12-08 MED ORDER — ESOMEPRAZOLE MAGNESIUM 40 MG PO CPDR: 40.0000 mg | DELAYED_RELEASE_CAPSULE | Freq: Every day | ORAL | 12 refills | Status: DC

## 2016-12-08 MED ORDER — VALACYCLOVIR HCL 1 G PO TABS: 1000.0000 mg | ORAL_TABLET | Freq: Every day | ORAL | 12 refills | Status: DC

## 2016-12-08 MED ORDER — METFORMIN HCL ER 500 MG PO TB24: 1000.0000 mg | ORAL_TABLET | Freq: Every day | ORAL | 4 refills | Status: DC

## 2016-12-08 MED ORDER — VARENICLINE TARTRATE 0.5 MG X 11 & 1 MG X 42 PO MISC: ORAL | 0 refills | Status: DC

## 2016-12-08 NOTE — Progress Notes (Signed)
Date:  12/08/2016   Name:  Karl Barrett   DOB:  01/30/1971   MRN:  [id number]   Chief Complaint: Annual Exam Edmond Ginsberg is a 46 y.o. male who presents today for his Complete Annual Exam. He feels fairly well. He reports exercising with physical job. He reports he is sleeping fairly well.   He has not been checking his BS.  He is taking no medication.  In the past he claimed to have severe stomach upset to metformin.  I prescribed Invokana last year but he did not take it due to concern over side effects.  He wanted to lose weight and control BS with diet but has not been successful.  He thinks he is due for an EGD.  His gerd sx are well controlled as long as he takes medication daily.  He may also be due for colonoscopy - he had polyps in the past but unsure of type or follow up interval.   Diabetes  He presents for his follow-up diabetic visit. He has type 2 diabetes mellitus. His disease course has been worsening. Pertinent negatives for hypoglycemia include no dizziness or headaches. Associated symptoms include polydipsia and polyuria. Pertinent negatives for diabetes include no chest pain and no fatigue. Symptoms are worsening. There are no diabetic complications. There is no compliance with monitoring of blood glucose.  Gastroesophageal Reflux  He complains of heartburn. He reports no abdominal pain, no chest pain, no choking or no wheezing. This is a recurrent problem. The problem occurs rarely. Pertinent negatives include no fatigue. He has tried a PPI for the symptoms. Past procedures include an EGD. no change in Barrett's esophagus.  Tobacco use - he would be interested in trying chantix but was concerned he could not take it with Welbutrin. Herpes - no recent episodes on daily suppression.  Lab Results  Component Value Date   HGBA1C 8.3 (H) 12/04/2015     Review of Systems  Constitutional: Negative for appetite change, chills, diaphoresis, fatigue and unexpected  weight change.  HENT: Negative for hearing loss, tinnitus, trouble swallowing and voice change.   Eyes: Positive for visual disturbance.  Respiratory: Negative for choking, shortness of breath and wheezing.   Cardiovascular: Negative for chest pain, palpitations and leg swelling.  Gastrointestinal: Positive for heartburn. Negative for abdominal pain, blood in stool, constipation and diarrhea.  Endocrine: Positive for polydipsia and polyuria.  Genitourinary: Positive for flank pain and hematuria. Negative for difficulty urinating, dysuria and frequency.  Musculoskeletal: Negative for arthralgias, back pain and myalgias.  Skin: Negative for color change and rash.  Allergic/Immunologic: Positive for environmental allergies.  Neurological: Negative for dizziness, syncope and headaches.  Hematological: Negative for adenopathy.  Psychiatric/Behavioral: Negative for dysphoric mood, sleep disturbance and suicidal ideas.    Patient Active Problem List   Diagnosis Date Noted  . Tobacco use 12/04/2015  . Chronic infection of sinus 12/04/2015  . Plantar fasciitis 12/04/2015  . Barrett's esophagus with dysplasia 02/19/2015  . Carpal tunnel syndrome 02/19/2015  . Chronic peptic ulcer 02/19/2015  . Uncontrolled type 2 diabetes mellitus without complication, without long-term current use of insulin (HCC) 02/19/2015  . Arthritis of knee, degenerative 02/19/2015  . Recurrent genital herpes 02/19/2015    Prior to Admission medications   Medication Sig Start Date End Date Taking? Authorizing Provider  buPROPion (WELLBUTRIN SR) 150 MG 12 hr tablet TAKE 1 TABLET BY MOUTH TWICE A DAY 12/05/16  Yes Reubin Milan, MD  esomeprazole (NEXIUM) 40 MG  capsule Take 1 tablet by mouth daily. 11/17/12  Yes Historical Provider, MD  oxymetazoline (AFRIN) 0.05 % nasal spray Place 1 spray into both nostrils 2 (two) times daily.   Yes Historical Provider, MD  valACYclovir (VALTREX) 1000 MG tablet TAKE 1 TABLET BY MOUTH  EVERY DAY 12/15/15  Yes Reubin Milan, MD    Allergies  Allergen Reactions  . Metformin And Related Diarrhea    Past Surgical History:  Procedure Laterality Date  . LITHOTRIPSY      Social History  Substance Use Topics  . Smoking status: Current Every Day Smoker  . Smokeless tobacco: Never Used  . Alcohol use 0.0 oz/week   Depression screen [facility] 2/9 12/08/2016  Decreased Interest 0  Down, Depressed, Hopeless 0  PHQ - 2 Score 0     Medication list has been reviewed and updated.   Physical Exam  Constitutional: He is oriented to person, place, and time. He appears well-developed and well-nourished.  HENT:  Head: Normocephalic.  Right Ear: External ear normal.  Left Ear: External ear normal.  Nose: Nose normal.  Mouth/Throat: Uvula is midline and oropharynx is clear and moist.  excessive cerumen bilaterally  Eyes: Conjunctivae and EOM are normal. Pupils are equal, round, and reactive to light.  Neck: Normal range of motion. Neck supple. Carotid bruit is not present. No thyromegaly present.  Cardiovascular: Normal rate, regular rhythm, normal heart sounds and intact distal pulses.   Pulmonary/Chest: Effort normal and breath sounds normal. He has no wheezes. Right breast exhibits no mass. Left breast exhibits no mass.  Abdominal: Soft. Normal appearance and bowel sounds are normal. There is no hepatosplenomegaly. There is no tenderness. There is no CVA tenderness. No hernia.  Musculoskeletal: Normal range of motion.  Lymphadenopathy:    He has no cervical adenopathy.  Neurological: He is alert and oriented to person, place, and time. He has normal reflexes.  Skin: Skin is warm, dry and intact.  Psychiatric: He has a normal mood and affect. His speech is normal and behavior is normal. Judgment and thought content normal.  Nursing note and vitals reviewed.   BP 112/64 (BP Location: Left Arm, Patient Position: Sitting, Cuff Size: Large)   Pulse 76   Ht  (1.854 m)    Wt 242 lb (109.8 kg)   SpO2 98%   BMI 31.93 kg/m   Assessment and Plan: 1. Annual physical exam Continue efforts at diet and exercise  2. Uncontrolled type 2 diabetes mellitus without complication, without long-term current use of insulin (HCC) Needs to begin medication and monitoring of BS He agrees to try metformin again - he is instructed to call if side effects are not tolerable so I can change his medication - Comprehensive metabolic panel - Hemoglobin A1c - Lipid panel - Microalbumin / creatinine urine ratio - TSH - POC urinalysis w microscopic (non auto)  3. Barrett's esophagus with dysplasia Continue daily PPI and follow up with GI - CBC with Differential/Platelet - Ambulatory referral to Gastroenterology  4. Recurrent genital herpes Controlled on medication  5. History of kidney stones Trace blood on UA today  Increase fluids and follow up with me or UC if worsening   Meds ordered this encounter  Medications  . valACYclovir (VALTREX) 1000 MG tablet    Sig: Take 1 tablet (1,000 mg total) by mouth daily.    Dispense:  30 tablet    Refill:  12  . esomeprazole (NEXIUM) 40 MG capsule    Sig: Take 1  capsule (40 mg total) by mouth daily.    Dispense:  30 capsule    Refill:  12  . varenicline (CHANTIX STARTING MONTH PAK) 0.5 MG X 11 & 1 MG X 42 tablet    Sig: Take one 0.5 mg tablet by mouth once daily for 3 days, then increase to one 0.5 mg tablet twice daily for 4 days, then increase to one 1 mg tablet twice daily.    Dispense:  53 tablet    Refill:  0  . metFORMIN (GLUCOPHAGE-XR) 500 MG 24 hr tablet    Sig: Take 2 tablets (1,000 mg total) by mouth daily with breakfast.    Dispense:  60 tablet    Refill:  4    Bari Edward, MD [facility]  12/08/2016

## 2016-12-08 NOTE — Patient Instructions (Signed)
Begin metformin ONE a day in the AM with food.  After 2 weeks if no side effects, increase to TWO in the AM with food.   Steps to Quit Smoking Smoking tobacco can be harmful to your health and can affect almost every organ in your body. Smoking puts you, and those around you, at risk for developing many serious chronic diseases. Quitting smoking is difficult, but it is one of the best things that you can do for your health. It is never too late to quit. What are the benefits of quitting smoking? When you quit smoking, you lower your risk of developing serious diseases and conditions, such as:  Lung cancer or lung disease, such as COPD.  Heart disease.  Stroke.  Heart attack.  Infertility.  Osteoporosis and bone fractures. Additionally, symptoms such as coughing, wheezing, and shortness of breath may get better when you quit. You may also find that you get sick less often because your body is stronger at fighting off colds and infections. If you are pregnant, quitting smoking can help to reduce your chances of having a baby of low birth weight. How do I get ready to quit? When you decide to quit smoking, create a plan to make sure that you are successful. Before you quit:  Pick a date to quit. Set a date within the next two weeks to give you time to prepare.  Write down the reasons why you are quitting. Keep this list in places where you will see it often, such as on your bathroom mirror or in your car or wallet.  Identify the people, places, things, and activities that make you want to smoke (triggers) and avoid them. Make sure to take these actions:  Throw away all cigarettes at home, at work, and in your car.  Throw away smoking accessories, such as Set designer.  Clean your car and make sure to empty the ashtray.  Clean your home, including curtains and carpets.  Tell your family, friends, and coworkers that you are quitting. Support from your loved ones can make  quitting easier.  Talk with your health care provider about your options for quitting smoking.  Find out what treatment options are covered by your health insurance. What strategies can I use to quit smoking? Talk with your healthcare provider about different strategies to quit smoking. Some strategies include:  Quitting smoking altogether instead of gradually lessening how much you smoke over a period of time. Research shows that quitting "cold [place]" is more successful than gradually quitting.  Attending in-person counseling to help you build problem-solving skills. You are more likely to have success in quitting if you attend several counseling sessions. Even short sessions of 10 minutes can be effective.  Finding resources and support systems that can help you to quit smoking and remain smoke-free after you quit. These resources are most helpful when you use them often. They can include:  Online chats with a Veterinary surgeon.  Telephone quitlines.  Printed Materials engineer.  Support groups or group counseling.  Text messaging programs.  Mobile phone applications.  Taking medicines to help you quit smoking. (If you are pregnant or breastfeeding, talk with your health care provider first.) Some medicines contain nicotine and some do not. Both types of medicines help with cravings, but the medicines that include nicotine help to relieve withdrawal symptoms. Your health care provider may recommend:  Nicotine patches, gum, or lozenges.  Nicotine inhalers or sprays.  Non-nicotine medicine that is taken by mouth.  Talk with your health care provider about combining strategies, such as taking medicines while you are also receiving in-person counseling. Using these two strategies together makes you more likely to succeed in quitting than if you used either strategy on its own. If you are pregnant or breastfeeding, talk with your health care provider about finding counseling or other support  strategies to quit smoking. Do not take medicine to help you quit smoking unless told to do so by your health care provider. What things can I do to make it easier to quit? Quitting smoking might feel overwhelming at first, but there is a lot that you can do to make it easier. Take these important actions:  Reach out to your family and friends and ask that they support and encourage you during this time. Call telephone quitlines, reach out to support groups, or work with a counselor for support.  Ask people who smoke to avoid smoking around you.  Avoid places that trigger you to smoke, such as bars, parties, or smoke-break areas at work.  Spend time around people who do not smoke.  Lessen stress in your life, because stress can be a smoking trigger for some people. To lessen stress, try:  Exercising regularly.  Deep-breathing exercises.  Yoga.  Meditating.  Performing a body scan. This involves closing your eyes, scanning your body from head to toe, and noticing which parts of your body are particularly tense. Purposefully relax the muscles in those areas.  Download or purchase mobile phone or tablet apps (applications) that can help you stick to your quit plan by providing reminders, tips, and encouragement. There are many free apps, such as QuitGuide from the [employer] developer for Disease Control and Prevention). You can find other support for quitting smoking (smoking cessation) through smokefree.gov and other websites. How will I feel when I quit smoking? Within the first 24 hours of quitting smoking, you may start to feel some withdrawal symptoms. These symptoms are usually most noticeable 2-3 days after quitting, but they usually do not last beyond 2-3 weeks. Changes or symptoms that you might experience include:  Mood swings.  Restlessness, anxiety, or irritation.  Difficulty concentrating.  Dizziness.  Strong cravings for sugary foods in addition to nicotine.  Mild weight  gain.  Constipation.  Nausea.  Coughing or a sore throat.  Changes in how your medicines work in your body.  A depressed mood.  Difficulty sleeping (insomnia). After the first 2-3 weeks of quitting, you may start to notice more positive results, such as:  Improved sense of smell and taste.  Decreased coughing and sore throat.  Slower heart rate.  Lower blood pressure.  Clearer skin.  The ability to breathe more easily.  Fewer sick days. Quitting smoking is very challenging for most people. Do not get discouraged if you are not successful the first time. Some people need to make many attempts to quit before they achieve long-term success. Do your best to stick to your quit plan, and talk with your health care provider if you have any questions or concerns. This information is not intended to replace advice given to you by your health care provider. Make sure you discuss any questions you have with your health care provider. Document Released: 08/04/2001 Document Revised: 04/07/2016 Document Reviewed: 12/25/2014 Elsevier Interactive Patient Education  2017 [employer].

## 2016-12-09 LAB — LIPID PANEL
CHOL/HDL RATIO: 4.7 ratio (ref 0.0–5.0)
Cholesterol, Total: 187 mg/dL (ref 100–199)
HDL: 40 mg/dL (ref >39)
LDL Calculated: 118 mg/dL — ABNORMAL HIGH (ref 0–99)
Triglycerides: 143 mg/dL (ref 0–149)
VLDL Cholesterol Cal: 29 mg/dL (ref 5–40)

## 2016-12-09 LAB — COMPREHENSIVE METABOLIC PANEL
ALBUMIN: 4.3 g/dL (ref 3.5–5.5)
ALK PHOS: 77 IU/L (ref 39–117)
ALT: 43 IU/L (ref 0–44)
AST: 19 IU/L (ref 0–40)
Albumin/Globulin Ratio: 1.8 (ref 1.2–2.2)
BUN/Creatinine Ratio: 20 (ref 9–20)
BUN: 13 mg/dL (ref 6–24)
Bilirubin Total: 0.8 mg/dL (ref 0.0–1.2)
CO2: 22 mmol/L (ref 18–29)
CREATININE: 0.64 mg/dL — AB (ref 0.76–1.27)
Calcium: 9.2 mg/dL (ref 8.7–10.2)
Chloride: 97 mmol/L (ref 96–106)
GFR calc Af Amer: 134 mL/min/{1.73_m2} (ref >59)
GFR calc non Af Amer: 116 mL/min/{1.73_m2} (ref >59)
GLOBULIN, TOTAL: 2.4 g/dL (ref 1.5–4.5)
Glucose: 192 mg/dL — ABNORMAL HIGH (ref 65–99)
POTASSIUM: 4.2 mmol/L (ref 3.5–5.2)
SODIUM: 138 mmol/L (ref 134–144)
Total Protein: 6.7 g/dL (ref 6.0–8.5)

## 2016-12-09 LAB — CBC WITH DIFFERENTIAL/PLATELET
BASOS ABS: 0 10*3/uL (ref 0.0–0.2)
Basos: 0 %
EOS (ABSOLUTE): 0.1 10*3/uL (ref 0.0–0.4)
Eos: 2 %
Hematocrit: 52 % — ABNORMAL HIGH (ref 37.5–51.0)
Hemoglobin: 17.9 g/dL — ABNORMAL HIGH (ref 13.0–17.7)
IMMATURE GRANS (ABS): 0 10*3/uL (ref 0.0–0.1)
IMMATURE GRANULOCYTES: 0 %
LYMPHS: 26 %
Lymphocytes Absolute: 2.3 10*3/uL (ref 0.7–3.1)
MCH: 32.5 pg (ref 26.6–33.0)
MCHC: 34.4 g/dL (ref 31.5–35.7)
MCV: 95 fL (ref 79–97)
MONOS ABS: 0.7 10*3/uL (ref 0.1–0.9)
Monocytes: 8 %
NEUTROS PCT: 64 %
Neutrophils Absolute: 5.7 10*3/uL (ref 1.4–7.0)
PLATELETS: 154 10*3/uL (ref 150–379)
RBC: 5.5 x10E6/uL (ref 4.14–5.80)
RDW: 13.4 % (ref 12.3–15.4)
WBC: 8.8 10*3/uL (ref 3.4–10.8)

## 2016-12-09 LAB — TSH: TSH: 2.91 u[IU]/mL (ref 0.450–4.500)

## 2016-12-09 LAB — HEMOGLOBIN A1C
Est. average glucose Bld gHb Est-mCnc: 206 mg/dL
HEMOGLOBIN A1C: 8.8 % — AB (ref 4.8–5.6)

## 2017-01-03 ENCOUNTER — Other Ambulatory Visit: Payer: Self-pay | Admitting: Internal Medicine

## 2017-02-06 ENCOUNTER — Other Ambulatory Visit: Payer: Self-pay | Admitting: Internal Medicine

## 2017-04-09 ENCOUNTER — Ambulatory Visit: Payer: BLUE CROSS/BLUE SHIELD | Admitting: Internal Medicine

## 2017-05-03 ENCOUNTER — Ambulatory Visit (INDEPENDENT_AMBULATORY_CARE_PROVIDER_SITE_OTHER): Payer: BLUE CROSS/BLUE SHIELD | Admitting: Internal Medicine

## 2017-05-03 ENCOUNTER — Other Ambulatory Visit: Payer: Self-pay | Admitting: Internal Medicine

## 2017-05-03 ENCOUNTER — Encounter: Payer: Self-pay | Admitting: Internal Medicine

## 2017-05-03 VITALS — BP 114/64 | HR 79 | Ht 73.0 in | Wt 235.0 lb

## 2017-05-03 DIAGNOSIS — Z23 Encounter for immunization: Secondary | ICD-10-CM | POA: Diagnosis not present

## 2017-05-03 DIAGNOSIS — E1165 Type 2 diabetes mellitus with hyperglycemia: Secondary | ICD-10-CM

## 2017-05-03 DIAGNOSIS — S81832A Puncture wound without foreign body, left lower leg, initial encounter: Secondary | ICD-10-CM | POA: Diagnosis not present

## 2017-05-03 DIAGNOSIS — K22719 Barrett's esophagus with dysplasia, unspecified: Secondary | ICD-10-CM

## 2017-05-03 DIAGNOSIS — F17200 Nicotine dependence, unspecified, uncomplicated: Secondary | ICD-10-CM

## 2017-05-03 DIAGNOSIS — IMO0001 Reserved for inherently not codable concepts without codable children: Secondary | ICD-10-CM

## 2017-05-03 DIAGNOSIS — S81839A Puncture wound without foreign body, unspecified lower leg, initial encounter: Secondary | ICD-10-CM | POA: Insufficient documentation

## 2017-05-03 MED ORDER — METFORMIN HCL ER 500 MG PO TB24: 1000.0000 mg | ORAL_TABLET | Freq: Every day | ORAL | 4 refills | Status: DC

## 2017-05-03 MED ORDER — VARENICLINE TARTRATE 0.5 MG X 11 & 1 MG X 42 PO MISC: ORAL | 0 refills | Status: DC

## 2017-05-03 NOTE — Patient Instructions (Addendum)
CALL Dr. Marva PandaSkulskie at [phone] to discuss appt at [facility] for Colonoscopy.   Change the Metformin - 2 tablets with supper every day.Tdap Vaccine (Tetanus, Diphtheria and Pertussis): What You Need to Know 1. Why get vaccinated? Tetanus, diphtheria and pertussis are very serious diseases. Tdap vaccine can protect us from these diseases. And, Tdap vaccine given to pregnant women can protect newborn babies against pertussis. TETANUS (Lockjaw) is rare in the [place] today. It causes painful muscle tightening and stiffness, usually all over the body.  It can lead to tightening of muscles in the head and neck so you can't open your mouth, swallow, or sometimes even breathe. Tetanus kills about 1 out of 10 people who are infected even after receiving the best medical care.  DIPHTHERIA is also rare in the [place] today. It can cause a thick coating to form in the back of the throat.  It can lead to breathing problems, heart failure, paralysis, and death.  PERTUSSIS (Whooping Cough) causes severe coughing spells, which can cause difficulty breathing, vomiting and disturbed sleep.  It can also lead to weight loss, incontinence, and rib fractures. Up to 2 in 100 adolescents and 5 in 100 adults with pertussis are hospitalized or have complications, which could include pneumonia or death.  These diseases are caused by bacteria. Diphtheria and pertussis are spread from person to person through secretions from coughing or sneezing. Tetanus enters the body through cuts, scratches, or wounds. Before vaccines, as many as 200,000 cases of diphtheria, 200,000 cases of pertussis, and hundreds of cases of tetanus, were reported in the [place] each year. Since vaccination began, reports of cases for tetanus and diphtheria have dropped by about 99% and for pertussis by about 80%. 2. Tdap vaccine Tdap vaccine can protect adolescents and adults from tetanus, diphtheria, and pertussis.  One dose of Tdap is routinely given at age 48 or 6012. People who did not get Tdap at that age should get it as soon as possible. Tdap is especially important for healthcare professionals and anyone having close contact with a baby younger than 12 months. Pregnant women should get a dose of Tdap during every pregnancy, to protect the newborn from pertussis. Infants are most at risk for severe, life-threatening complications from pertussis. Another vaccine, called Td, protects against tetanus and diphtheria, but not pertussis. A Td booster should be given every 10 years. Tdap may be given as one of these boosters if you have never gotten Tdap before. Tdap may also be given after a severe cut or burn to prevent tetanus infection. Your doctor or the person giving you the vaccine can give you more information. Tdap may safely be given at the same time as other vaccines. 3. Some people should not get this vaccine  A person who has ever had a life-threatening allergic reaction after a previous dose of any diphtheria, tetanus or pertussis containing vaccine, OR has a severe allergy to any part of this vaccine, should not get Tdap vaccine. Tell the person giving the vaccine about any severe allergies.  Anyone who had coma or long repeated seizures within 7 days after a childhood dose of DTP or DTaP, or a previous dose of Tdap, should not get Tdap, unless a cause other than the vaccine was found. They can still get Td.  Talk to your doctor if you: ? have seizures or another nervous system problem, ? had severe pain or swelling after any vaccine containing diphtheria, tetanus or pertussis, ?  ever had a condition called Guillain-Barr Syndrome (GBS), ? aren't feeling well on the day the shot is scheduled. 4. Risks With any medicine, including vaccines, there is a chance of side effects. These are usually mild and go away on their own. Serious reactions are also possible but are rare. Most people who get Tdap  vaccine do not have any problems with it. Mild problems following Tdap: (Did not interfere with activities)  Pain where the shot was given (about 3 in 4 adolescents or 2 in 3 adults)  Redness or swelling where the shot was given (about 1 person in 5)  Mild fever of at least 100.92F (up to about 1 in 25 adolescents or 1 in 100 adults)  Headache (about 3 or 4 people in 10)  Tiredness (about 1 person in 3 or 4)  Nausea, vomiting, diarrhea, stomach ache (up to 1 in 4 adolescents or 1 in 10 adults)  Chills, sore joints (about 1 person in 10)  Body aches (about 1 person in 3 or 4)  Rash, swollen glands (uncommon)  Moderate problems following Tdap: (Interfered with activities, but did not require medical attention)  Pain where the shot was given (up to 1 in 5 or 6)  Redness or swelling where the shot was given (up to about 1 in 16 adolescents or 1 in 12 adults)  Fever over 102F (about 1 in 100 adolescents or 1 in 250 adults)  Headache (about 1 in 7 adolescents or 1 in 10 adults)  Nausea, vomiting, diarrhea, stomach ache (up to 1 or 3 people in 100)  Swelling of the entire arm where the shot was given (up to about 1 in 500).  Severe problems following Tdap: (Unable to perform usual activities; required medical attention)  Swelling, severe pain, bleeding and redness in the arm where the shot was given (rare).  Problems that could happen after any vaccine:  People sometimes faint after a medical procedure, including vaccination. Sitting or lying down for about 15 minutes can help prevent fainting, and injuries caused by a fall. Tell your doctor if you feel dizzy, or have vision changes or ringing in the ears.  Some people get severe pain in the shoulder and have difficulty moving the arm where a shot was given. This happens very rarely.  Any medication can cause a severe allergic reaction. Such reactions from a vaccine are very rare, estimated at fewer than 1 in a million  doses, and would happen within a few minutes to a few hours after the vaccination. As with any medicine, there is a very remote chance of a vaccine causing a serious injury or death. The safety of vaccines is always being monitored. For more information, visit: http://floyd.org/ 5. What if there is a serious problem? What should I look for? Look for anything that concerns you, such as signs of a severe allergic reaction, very high fever, or unusual behavior. Signs of a severe allergic reaction can include hives, swelling of the face and throat, difficulty breathing, a fast heartbeat, dizziness, and weakness. These would usually start a few minutes to a few hours after the vaccination. What should I do?  If you think it is a severe allergic reaction or other emergency that can't wait, call 9-1-1 or get the person to the nearest hospital. Otherwise, call your doctor.  Afterward, the reaction should be reported to the Vaccine Adverse Event Reporting System (VAERS). Your doctor might file this report, or you can do it yourself through the  VAERS web site at www.vaers.LAgents.no, or by calling 1-(564) 645-0856. ? VAERS does not give medical advice. 6. The National Vaccine Injury Compensation Program The [employer] Vaccine Injury Compensation Program (VICP) is a federal program that was created to compensate people who may have been injured by certain vaccines. Persons who believe they may have been injured by a vaccine can learn about the program and about filing a claim by calling 1-339-111-5752 or visiting the VICP website at SpiritualWord.at. There is a time limit to file a claim for compensation. 7. How can I learn more?  Ask your doctor. He or she can give you the vaccine package insert or suggest other sources of information.  Call your local or state health department.  Contact the Centers for Disease Control and Prevention (CDC): ? Call 940-762-4648 (1-800-CDC-INFO)  or ? Visit CDC's website at PicCapture.uy CDC Tdap Vaccine VIS (10/17/13) This information is not intended to replace advice given to you by your health care provider. Make sure you discuss any questions you have with your health care provider. Document Released: 02/09/2012 Document Revised: 04/30/2016 Document Reviewed: 04/30/2016 Elsevier Interactive Patient Education  2017 [employer].

## 2017-05-03 NOTE — Progress Notes (Signed)
Date:  05/03/2017   Name:  Karl Barrett   DOB:  02-24-1971   MRN:  [id number]   Chief Complaint: Diabetes (Getting off track taking metformin. Not taking as supposed to. Forgetting medication in mornings. ) and Nicotine Dependence (Lost Rx for chantix. Would like another Prescription. ) Diabetes  He presents for his follow-up diabetic visit. He has type 2 diabetes mellitus. His disease course has been stable. Pertinent negatives for hypoglycemia include no headaches, nervousness/anxiousness or tremors. Pertinent negatives for diabetes include no chest pain, no fatigue, no polydipsia and no polyuria.  Nicotine Dependence  Presents for follow-up visit. Symptoms are negative for fatigue. His urge triggers include company of smokers. The symptoms have been stable. He smokes 1 pack of cigarettes per day. Compliance with prior treatments: has not tried Chantix yet but is ready.  Gastroesophageal Reflux  He complains of coughing (morning cough). He reports no abdominal pain, no chest pain or no wheezing. Pertinent negatives include no fatigue. Risk factors include Barrett's esophagus. He has tried a PPI for the symptoms.  Seen by GI and supposedly being scheduled at [facility] but he has not heard back. Frequent punctures and metal splinters at work - one in knee last week.  Unsure of last Tdap or Td.  Lab Results  Component Value Date   HGBA1C 8.8 (H) 12/08/2016     Review of Systems  Constitutional: Negative for appetite change, fatigue and unexpected weight change.  Eyes: Negative for visual disturbance.  Respiratory: Positive for cough (morning cough). Negative for chest tightness, shortness of breath and wheezing.   Cardiovascular: Negative for chest pain, palpitations and leg swelling.  Gastrointestinal: Negative for abdominal pain and blood in stool.  Endocrine: Negative for polydipsia and polyuria.  Genitourinary: Negative for dysuria and hematuria.    Musculoskeletal: Negative for arthralgias.  Skin: Positive for wound. Negative for color change and rash.  Neurological: Negative for tremors, numbness and headaches.  Psychiatric/Behavioral: Negative for dysphoric mood and sleep disturbance. The patient is not nervous/anxious.     Patient Active Problem List   Diagnosis Date Noted  . Puncture wound of leg excluding thigh 05/03/2017  . History of kidney stones 12/08/2016  . Tobacco use 12/04/2015  . Chronic infection of sinus 12/04/2015  . Plantar fasciitis 12/04/2015  . Barrett's esophagus with dysplasia 02/19/2015  . Carpal tunnel syndrome 02/19/2015  . Chronic peptic ulcer 02/19/2015  . Uncontrolled type 2 diabetes mellitus without complication, without long-term current use of insulin (HCC) 02/19/2015  . Arthritis of knee, degenerative 02/19/2015  . Recurrent genital herpes 02/19/2015    Prior to Admission medications   Medication Sig Start Date End Date Taking? Authorizing Provider  buPROPion [facility] SR) 150 MG 12 hr tablet TAKE 1 TABLET BY MOUTH TWICE A DAY 02/06/17   Reubin Milan, MD  esomeprazole (NEXIUM) 40 MG capsule Take 1 capsule (40 mg total) by mouth daily. 12/08/16   Reubin Milan, MD  metFORMIN (GLUCOPHAGE-XR) 500 MG 24 hr tablet Take 2 tablets (1,000 mg total) by mouth daily with breakfast. 12/08/16   Reubin Milan, MD  oxymetazoline (AFRIN) 0.05 % nasal spray Place 1 spray into both nostrils 2 (two) times daily.    [provider]  valACYclovir (VALTREX) 1000 MG tablet Take 1 tablet (1,000 mg total) by mouth daily. 12/08/16   Reubin Milan, MD  varenicline (CHANTIX STARTING MONTH PAK) 0.5 MG X 11 & 1 MG X 42 tablet Take one 0.5 mg  tablet by mouth once daily for 3 days, then increase to one 0.5 mg tablet twice daily for 4 days, then increase to one 1 mg tablet twice daily. 12/08/16   Reubin MilanBerglund, Akyra Bouchie H, MD    Allergies  Allergen Reactions  . Metformin And Related Diarrhea    Past Surgical  History:  Procedure Laterality Date  . LITHOTRIPSY      Social History  Substance Use Topics  . Smoking status: Current Every Day Smoker    Packs/day: 1.00  . Smokeless tobacco: Never Used  . Alcohol use 0.0 oz/week     Medication list has been reviewed and updated.  PHQ 2/9 Scores 12/08/2016  PHQ - 2 Score 0    Physical Exam  Constitutional: He is oriented to person, place, and time. He appears well-developed. No distress.  HENT:  Head: Normocephalic and atraumatic.  Neck: Normal range of motion. Neck supple. Carotid bruit is not present. No thyromegaly present.  Cardiovascular: Normal rate, regular rhythm and normal heart sounds.   Pulmonary/Chest: Effort normal and breath sounds normal. No respiratory distress. He has no wheezes.  Musculoskeletal: Normal range of motion.  Neurological: He is alert and oriented to person, place, and time.  Skin: Skin is warm and dry. No rash noted.     Psychiatric: He has a normal mood and affect. His behavior is normal. Thought content normal.  Nursing note and vitals reviewed.   BP 114/64   Pulse 79   Ht 6\' 1"  (1.854 m)   Wt 235 lb (106.6 kg)   SpO2 98%   BMI 31.00 kg/m   Assessment and Plan: 1. Uncontrolled type 2 diabetes mellitus without complication, without long-term current use of insulin (HCC) Move both metformin to supper for better compliance - Hemoglobin A1c - Microalbumin / creatinine urine ratio - metFORMIN (GLUCOPHAGE-XR) 500 MG 24 hr tablet; Take 2 tablets (1,000 mg total) by mouth daily with supper.  Dispense: 60 tablet; Refill: 4  2. Tobacco use disorder - varenicline (CHANTIX STARTING MONTH PAK) 0.5 MG X 11 & 1 MG X 42 tablet; Take one 0.5 mg tablet by mouth once daily for 3 days, then increase to one 0.5 mg tablet twice daily for 4 days, then increase to one 1 mg tablet twice daily.  Dispense: 53 tablet; Refill: 0  3. Barrett's esophagus with dysplasia Call GI for scheduling  4. Puncture wound of left  lower extremity excluding thigh, initial encounter Local care recommended Follow up if needed - Tdap vaccine greater than or equal to 7yo IM   Meds ordered this encounter  Medications  . varenicline (CHANTIX STARTING MONTH PAK) 0.5 MG X 11 & 1 MG X 42 tablet    Sig: Take one 0.5 mg tablet by mouth once daily for 3 days, then increase to one 0.5 mg tablet twice daily for 4 days, then increase to one 1 mg tablet twice daily.    Dispense:  53 tablet    Refill:  0  . metFORMIN (GLUCOPHAGE-XR) 500 MG 24 hr tablet    Sig: Take 2 tablets (1,000 mg total) by mouth daily with supper.    Dispense:  60 tablet    Refill:  4    Partially dictated using Animal nutritionistDragon software. Any errors are unintentional.  Bari EdwardLaura Brynnlie Unterreiner, MD [facility]  05/03/2017

## 2017-05-04 LAB — MICROALBUMIN / CREATININE URINE RATIO
Creatinine, Urine: 145.1 mg/dL
Microalb/Creat Ratio: 7.6 mg/g creat (ref 0.0–30.0)
Microalbumin, Urine: 11.1 ug/mL

## 2017-05-04 LAB — HEMOGLOBIN A1C
ESTIMATED AVERAGE GLUCOSE: 189 mg/dL
HEMOGLOBIN A1C: 8.2 % — AB (ref 4.8–5.6)

## 2017-09-06 ENCOUNTER — Ambulatory Visit: Payer: BLUE CROSS/BLUE SHIELD | Admitting: Internal Medicine

## 2017-09-08 ENCOUNTER — Ambulatory Visit: Payer: BLUE CROSS/BLUE SHIELD | Admitting: Internal Medicine

## 2017-09-16 ENCOUNTER — Ambulatory Visit: Payer: BLUE CROSS/BLUE SHIELD | Admitting: Internal Medicine

## 2017-09-16 ENCOUNTER — Encounter: Payer: Self-pay | Admitting: Internal Medicine

## 2017-09-16 VITALS — BP 116/70 | HR 88 | Ht 73.0 in | Wt 238.0 lb

## 2017-09-16 DIAGNOSIS — E785 Hyperlipidemia, unspecified: Secondary | ICD-10-CM | POA: Diagnosis not present

## 2017-09-16 DIAGNOSIS — E1165 Type 2 diabetes mellitus with hyperglycemia: Secondary | ICD-10-CM | POA: Diagnosis not present

## 2017-09-16 DIAGNOSIS — Z720 Tobacco use: Secondary | ICD-10-CM | POA: Diagnosis not present

## 2017-09-16 DIAGNOSIS — E1169 Type 2 diabetes mellitus with other specified complication: Secondary | ICD-10-CM | POA: Diagnosis not present

## 2017-09-16 DIAGNOSIS — Z23 Encounter for immunization: Secondary | ICD-10-CM | POA: Diagnosis not present

## 2017-09-16 DIAGNOSIS — M19049 Primary osteoarthritis, unspecified hand: Secondary | ICD-10-CM | POA: Insufficient documentation

## 2017-09-16 DIAGNOSIS — IMO0001 Reserved for inherently not codable concepts without codable children: Secondary | ICD-10-CM

## 2017-09-16 MED ORDER — ATORVASTATIN CALCIUM 10 MG PO TABS: 10.0000 mg | ORAL_TABLET | Freq: Every day | ORAL | 3 refills | Status: DC

## 2017-09-16 MED ORDER — BUPROPION HCL ER (SR) 150 MG PO TB12: 150.0000 mg | ORAL_TABLET | Freq: Two times a day (BID) | ORAL | 5 refills | Status: DC

## 2017-09-16 NOTE — Progress Notes (Signed)
Date:  09/16/2017   Name:  Karl Barrett   DOB:  20-May-1971   MRN:  [id number]   Chief Complaint: Diabetes and Immunizations (Flu SHot) Diabetes  He presents for his follow-up diabetic visit. He has type 2 diabetes mellitus. His disease course has been improving. Pertinent negatives for hypoglycemia include no dizziness, headaches or tremors. Associated symptoms include foot paresthesias. Pertinent negatives for diabetes include no chest pain, no fatigue, no polydipsia and no polyuria. Current diabetic treatment includes oral agent (monotherapy). He is compliant with treatment most of the time. There is no compliance with monitoring of blood glucose.  Nicotine Dependence  Presents for follow-up visit. Symptoms are negative for fatigue. His urge triggers include company of smokers. The symptoms have been stable. He smokes 1 pack of cigarettes per day. Compliance with prior treatments has been poor.  Hyperlipidemia  This is a chronic problem. Recent lipid tests were reviewed and are high. Pertinent negatives include no chest pain or shortness of breath. He is currently on no antihyperlipidemic treatment.  Arthritis of hands - having more pain and stiffness in both hands and elbows.  Also knees.  Not taking any nsaid due to barrett's.  Not taking tylenol either.  Lab Results  Component Value Date   HGBA1C 8.2 (H) 05/03/2017   Lab Results  Component Value Date   CHOL 187 12/08/2016   HDL 40 12/08/2016   LDLCALC 118 (H) 12/08/2016   TRIG 143 12/08/2016   CHOLHDL 4.7 12/08/2016     Review of Systems  Constitutional: Negative for appetite change, fatigue and unexpected weight change.  Eyes: Negative for visual disturbance.  Respiratory: Negative for cough, shortness of breath and wheezing.   Cardiovascular: Negative for chest pain, palpitations and leg swelling.  Gastrointestinal: Negative for abdominal pain and blood in stool.  Endocrine: Negative for polydipsia and polyuria.    Genitourinary: Negative for dysuria and hematuria.  Musculoskeletal: Positive for arthralgias (hands and knees).  Skin: Negative for color change and rash.  Allergic/Immunologic: Negative for environmental allergies.  Neurological: Positive for numbness. Negative for dizziness, tremors and headaches.  Hematological: Negative for adenopathy.  Psychiatric/Behavioral: Negative for dysphoric mood.    Patient Active Problem List   Diagnosis Date Noted  . History of kidney stones 12/08/2016  . Tobacco use 12/04/2015  . Chronic infection of sinus 12/04/2015  . Plantar fasciitis 12/04/2015  . Barrett's esophagus with dysplasia 02/19/2015  . Carpal tunnel syndrome 02/19/2015  . Chronic peptic ulcer 02/19/2015  . Uncontrolled type 2 diabetes mellitus without complication, without long-term current use of insulin (HCC) 02/19/2015  . Arthritis of knee, degenerative 02/19/2015  . Recurrent genital herpes 02/19/2015    Prior to Admission medications   Medication Sig Start Date End Date Taking? Authorizing Provider  buPROPion [facility] SR) 150 MG 12 hr tablet TAKE 1 TABLET BY MOUTH TWICE A DAY 02/06/17   Reubin Milan, MD  esomeprazole (NEXIUM) 40 MG capsule Take 1 capsule (40 mg total) by mouth daily. 12/08/16   Reubin Milan, MD  metFORMIN (GLUCOPHAGE-XR) 500 MG 24 hr tablet Take 2 tablets (1,000 mg total) by mouth daily with supper. 05/03/17   Reubin Milan, MD  oxymetazoline (AFRIN) 0.05 % nasal spray Place 1 spray into both nostrils 2 (two) times daily.    [provider]  valACYclovir (VALTREX) 1000 MG tablet Take 1 tablet (1,000 mg total) by mouth daily. 12/08/16   Reubin Milan, MD  varenicline (CHANTIX STARTING MONTH PAK) 0.5  MG X 11 & 1 MG X 42 tablet Take one 0.5 mg tablet by mouth once daily for 3 days, then increase to one 0.5 mg tablet twice daily for 4 days, then increase to one 1 mg tablet twice daily. 05/03/17   Reubin MilanBerglund, Samaj Wessells H, MD    Allergies  Allergen  Reactions  . Metformin And Related Diarrhea    Past Surgical History:  Procedure Laterality Date  . LITHOTRIPSY      Social History   Tobacco Use  . Smoking status: Current Every Day Smoker    Packs/day: 1.00  . Smokeless tobacco: Never Used  Substance Use Topics  . Alcohol use: Yes    Alcohol/week: 0.0 oz  . Drug use: No     Medication list has been reviewed and updated.  PHQ 2/9 Scores 12/08/2016  PHQ - 2 Score 0    Physical Exam  Constitutional: He is oriented to person, place, and time. He appears well-developed. No distress.  HENT:  Head: Normocephalic and atraumatic.  Neck: Normal range of motion. Neck supple.  Cardiovascular: Normal rate, regular rhythm and normal heart sounds.  Pulmonary/Chest: Effort normal and breath sounds normal. No respiratory distress. He has no wheezes.  Musculoskeletal: He exhibits no edema.  Mild OA changed of wrist and thumbs No knee effusions with good ROM  Neurological: He is alert and oriented to person, place, and time. He has normal strength. No sensory deficit.  Skin: Skin is warm and dry. No rash noted.  Psychiatric: He has a normal mood and affect. His behavior is normal. Thought content normal.  Nursing note and vitals reviewed.   BP 116/70   Pulse 88   Ht 6\' 1"  (1.854 m)   Wt 238 lb (108 kg)   SpO2 97%   BMI 31.40 kg/m   Assessment and Plan: 1. Uncontrolled type 2 diabetes mellitus without complication, without long-term current use of insulin (HCC) Continue metformin Will likely need to add another medication Pt reminded to get annual eye exam - Hemoglobin A1c  2. Hyperlipidemia associated with type 2 diabetes mellitus (HCC) Start statin  3. Tobacco use Continuing Patient has been advised of the health risks of smoking and counseled concerning cessation of tobacco products. I spent over 3 minutes for discussion and to answer questions.  4. Arthritis of hand Tylenol bid   No orders of the defined types  were placed in this encounter.   Partially dictated using Animal nutritionistDragon software. Any errors are unintentional.  Bari EdwardLaura Yanis Larin, MD [facility]  09/16/2017

## 2017-09-16 NOTE — Patient Instructions (Signed)
Tylenol extra strength twice a day -

## 2017-09-17 ENCOUNTER — Other Ambulatory Visit: Payer: Self-pay | Admitting: Internal Medicine

## 2017-09-17 LAB — COMPREHENSIVE METABOLIC PANEL
A/G RATIO: 1.9 (ref 1.2–2.2)
ALBUMIN: 4.3 g/dL (ref 3.5–5.5)
ALT: 37 IU/L (ref 0–44)
AST: 20 IU/L (ref 0–40)
Alkaline Phosphatase: 64 IU/L (ref 39–117)
BILIRUBIN TOTAL: 0.9 mg/dL (ref 0.0–1.2)
BUN / CREAT RATIO: 16 (ref 9–20)
BUN: 13 mg/dL (ref 6–24)
CO2: 20 mmol/L (ref 20–29)
Calcium: 9.2 mg/dL (ref 8.7–10.2)
Chloride: 98 mmol/L (ref 96–106)
Creatinine, Ser: 0.79 mg/dL (ref 0.76–1.27)
GFR, EST AFRICAN AMERICAN: 123 mL/min/{1.73_m2} (ref >59)
GFR, EST NON AFRICAN AMERICAN: 106 mL/min/{1.73_m2} (ref >59)
GLOBULIN, TOTAL: 2.3 g/dL (ref 1.5–4.5)
Glucose: 285 mg/dL — ABNORMAL HIGH (ref 65–99)
POTASSIUM: 4 mmol/L (ref 3.5–5.2)
SODIUM: 138 mmol/L (ref 134–144)
Total Protein: 6.6 g/dL (ref 6.0–8.5)

## 2017-09-17 LAB — HEMOGLOBIN A1C
Est. average glucose Bld gHb Est-mCnc: 203 mg/dL
HEMOGLOBIN A1C: 8.7 % — AB (ref 4.8–5.6)

## 2017-09-17 MED ORDER — GLIMEPIRIDE 2 MG PO TABS: 2.0000 mg | ORAL_TABLET | Freq: Every day | ORAL | 3 refills | Status: DC

## 2017-10-12 ENCOUNTER — Other Ambulatory Visit: Payer: Self-pay | Admitting: Internal Medicine

## 2017-12-18 ENCOUNTER — Other Ambulatory Visit: Payer: Self-pay | Admitting: Internal Medicine

## 2017-12-20 ENCOUNTER — Other Ambulatory Visit: Payer: Self-pay | Admitting: Internal Medicine

## 2017-12-20 ENCOUNTER — Ambulatory Visit (INDEPENDENT_AMBULATORY_CARE_PROVIDER_SITE_OTHER): Payer: BLUE CROSS/BLUE SHIELD | Admitting: Internal Medicine

## 2017-12-20 ENCOUNTER — Encounter: Payer: Self-pay | Admitting: Internal Medicine

## 2017-12-20 VITALS — BP 118/81 | HR 84 | Resp 16 | Ht 73.0 in | Wt 234.0 lb

## 2017-12-20 DIAGNOSIS — Z0000 Encounter for general adult medical examination without abnormal findings: Secondary | ICD-10-CM

## 2017-12-20 DIAGNOSIS — E1169 Type 2 diabetes mellitus with other specified complication: Secondary | ICD-10-CM | POA: Diagnosis not present

## 2017-12-20 DIAGNOSIS — E1165 Type 2 diabetes mellitus with hyperglycemia: Secondary | ICD-10-CM | POA: Diagnosis not present

## 2017-12-20 DIAGNOSIS — Z720 Tobacco use: Secondary | ICD-10-CM | POA: Diagnosis not present

## 2017-12-20 DIAGNOSIS — K22719 Barrett's esophagus with dysplasia, unspecified: Secondary | ICD-10-CM | POA: Diagnosis not present

## 2017-12-20 DIAGNOSIS — Z0001 Encounter for general adult medical examination with abnormal findings: Secondary | ICD-10-CM

## 2017-12-20 DIAGNOSIS — IMO0001 Reserved for inherently not codable concepts without codable children: Secondary | ICD-10-CM

## 2017-12-20 DIAGNOSIS — E785 Hyperlipidemia, unspecified: Secondary | ICD-10-CM | POA: Diagnosis not present

## 2017-12-20 LAB — POCT URINALYSIS DIPSTICK
BILIRUBIN UA: NEGATIVE
GLUCOSE UA: 1000
KETONES UA: NEGATIVE
Leukocytes, UA: NEGATIVE
Nitrite, UA: NEGATIVE
PH UA: 6 (ref 5.0–8.0)
Protein, UA: NEGATIVE
Spec Grav, UA: 1.015 (ref 1.010–1.025)
UROBILINOGEN UA: 0.2 U/dL

## 2017-12-20 NOTE — Patient Instructions (Addendum)
Schedule Diabetic eye exam.  You must take the Glimepiride in the morning with food.

## 2017-12-20 NOTE — Progress Notes (Signed)
Date:  12/20/2017   Name:  Karl Barrett   DOB:  1971-03-23   MRN:  [id number]   Chief Complaint: Annual Exam; Diabetes (Has not been to Eye Doctor ); and Hyperlipidemia Karl Barrett is a 47 y.o. male who presents today for his Complete Annual Exam. He feels well. He reports exercising some. He reports he is sleeping well. He is exercising more and trying to lose some weight, down about 8 lbs.  Diabetes  He presents for his follow-up diabetic visit. He has type 2 diabetes mellitus. His disease course has been worsening. Pertinent negatives for hypoglycemia include no dizziness or headaches. Pertinent negatives for diabetes include no chest pain, no fatigue, no polydipsia and no polyuria. Current diabetic treatment includes oral agent (dual therapy) (glimepiride added last visit). His weight is stable. An ACE inhibitor/angiotensin II receptor blocker is contraindicated. Eye exam is not current.  Hyperlipidemia  This is a chronic problem. The problem is controlled. Pertinent negatives include no chest pain, myalgias or shortness of breath. Current antihyperlipidemic treatment includes statins. The current treatment provides significant improvement of lipids.  Gastroesophageal Reflux  He complains of heartburn. He reports no abdominal pain, no chest pain, no choking or no wheezing. This is a chronic problem. Pertinent negatives include no fatigue. Risk factors include Barrett's esophagus. He has tried a PPI for the symptoms.  Nicotine Dependence  Presents for follow-up visit. Symptoms are negative for fatigue. His urge triggers include company of smokers. The symptoms have been stable.  Barrett's and colon polyps - he is due for follow up studies - GI referred him last year but he was never contacted by the Endoscopy center. Wart - he has a warty lesion on his right upper calf. Lab Results  Component Value Date   HGBA1C 8.7 (H) 09/16/2017   Lab Results  Component Value Date   CREATININE 0.79 09/16/2017   BUN 13 09/16/2017   NA 138 09/16/2017   K 4.0 09/16/2017   CL 98 09/16/2017   CO2 20 09/16/2017   Wt Readings from Last 3 Encounters:  12/20/17 234 lb (106.1 kg)  09/16/17 238 lb (108 kg)  05/03/17 235 lb (106.6 kg)      Review of Systems  Constitutional: Negative for appetite change, chills, diaphoresis, fatigue and unexpected weight change.  HENT: Negative for hearing loss, tinnitus, trouble swallowing and voice change.   Eyes: Negative for visual disturbance.  Respiratory: Negative for choking, shortness of breath and wheezing.   Cardiovascular: Negative for chest pain, palpitations and leg swelling.  Gastrointestinal: Positive for heartburn. Negative for abdominal pain, blood in stool, constipation and diarrhea.  Endocrine: Negative for polydipsia and polyuria.  Genitourinary: Negative for difficulty urinating, dysuria and frequency.  Musculoskeletal: Negative for arthralgias, back pain and myalgias.  Skin: Negative for color change and rash.       Wart on right leg  Neurological: Negative for dizziness, syncope and headaches.  Hematological: Negative for adenopathy.  Psychiatric/Behavioral: Negative for dysphoric mood and sleep disturbance.    Patient Active Problem List   Diagnosis Date Noted  . Hyperlipidemia associated with type 2 diabetes mellitus (HCC) 09/16/2017  . Arthritis of hand 09/16/2017  . History of kidney stones 12/08/2016  . Tobacco use 12/04/2015  . Chronic infection of sinus 12/04/2015  . Plantar fasciitis 12/04/2015  . Barrett's esophagus with dysplasia 02/19/2015  . Carpal tunnel syndrome 02/19/2015  . Chronic peptic ulcer 02/19/2015  . Uncontrolled type 2 diabetes mellitus without complication, without  long-term current use of insulin (HCC) 02/19/2015  . Arthritis of knee, degenerative 02/19/2015  . Recurrent genital herpes 02/19/2015    Prior to Admission medications   Medication Sig Start Date End Date Taking?  Authorizing Provider  atorvastatin (LIPITOR) 10 MG tablet Take 1 tablet (10 mg total) by mouth daily. 09/16/17   Reubin Milan, MD  buPROPion [facility] SR) 150 MG 12 hr tablet Take 1 tablet (150 mg total) by mouth 2 (two) times daily. 09/16/17   Reubin Milan, MD  esomeprazole (NEXIUM) 40 MG capsule TAKE 1 CAPSULE (40 MG TOTAL) BY MOUTH DAILY. 12/19/17   Reubin Milan, MD  glimepiride (AMARYL) 2 MG tablet Take 1 tablet (2 mg total) by mouth daily before breakfast. 09/17/17   Reubin Milan, MD  metFORMIN (GLUCOPHAGE-XR) 500 MG 24 hr tablet Take 2 tablets (1,000 mg total) by mouth daily with supper. 05/03/17   Reubin Milan, MD  metFORMIN (GLUCOPHAGE-XR) 500 MG 24 hr tablet TAKE 2 TABLETS (1,000 MG TOTAL) BY MOUTH DAILY WITH BREAKFAST. 10/12/17   Reubin Milan, MD  oxymetazoline (AFRIN) 0.05 % nasal spray Place 1 spray into both nostrils 2 (two) times daily.    [provider]  valACYclovir (VALTREX) 1000 MG tablet TAKE 1 TABLET (1,000 MG TOTAL) BY MOUTH DAILY. 12/19/17   Reubin Milan, MD    Allergies  Allergen Reactions  . Metformin And Related Diarrhea    Past Surgical History:  Procedure Laterality Date  . LITHOTRIPSY      Social History   Tobacco Use  . Smoking status: Current Every Day Smoker    Packs/day: 0.50    Years: 24.00    Pack years: 12.00  . Smokeless tobacco: Never Used  Substance Use Topics  . Alcohol use: Yes    Alcohol/week: 0.0 oz  . Drug use: No     Medication list has been reviewed and updated.  PHQ 2/9 Scores 12/20/2017 12/08/2016  PHQ - 2 Score 0 0    Physical Exam  Constitutional: He is oriented to person, place, and time. He appears well-developed and well-nourished.  HENT:  Head: Normocephalic.  Right Ear: Tympanic membrane, external ear and ear canal normal.  Left Ear: Tympanic membrane, external ear and ear canal normal.  Nose: Nose normal.  Mouth/Throat: Uvula is midline and oropharynx is clear and moist.    Eyes: Pupils are equal, round, and reactive to light. Conjunctivae and EOM are normal.  Neck: Normal range of motion. Neck supple. Carotid bruit is not present. No thyromegaly present.  Cardiovascular: Normal rate, regular rhythm, normal heart sounds and intact distal pulses.  Pulmonary/Chest: Effort normal and breath sounds normal. He has no wheezes. Right breast exhibits no mass. Left breast exhibits no mass.  Abdominal: Soft. Normal appearance and bowel sounds are normal. There is no hepatosplenomegaly. There is no tenderness.  Musculoskeletal: Normal range of motion.  Lymphadenopathy:    He has no cervical adenopathy.  Neurological: He is alert and oriented to person, place, and time. He has normal reflexes.  Skin: Skin is warm, dry and intact. Lesion noted. No rash noted.     Psychiatric: He has a normal mood and affect. His speech is normal and behavior is normal. Judgment and thought content normal.  Nursing note and vitals reviewed.   BP 118/81   Pulse 84   Resp 16   Ht  (1.854 m)   Wt 234 lb (106.1 kg)   SpO2 97%   BMI  30.87 kg/m   Assessment and Plan: 1. Annual physical exam Continue to work on diet and weight loss Wart treated with liquid nitrogen 10 seconds x 2 - POCT urinalysis dipstick  2. Uncontrolled type 2 diabetes mellitus without complication, without long-term current use of insulin (HCC) Continue metformin Begin glimepiride every am - Comprehensive metabolic panel - Hemoglobin A1c - Microalbumin / creatinine urine ratio  3. Hyperlipidemia associated with type 2 diabetes mellitus (HCC) On statin therapy - Lipid panel  4. Barrett's esophagus with dysplasia Referred back to [facility] - will see him tomorrow - CBC with Differential/Platelet  5. Tobacco use Continue Bupropion May consider another trial of Chantix - Nurse to provide smoking / tobacco cessation education   No orders of the defined types were placed in this encounter.   Partially  dictated using Animal nutritionist. Any errors are unintentional.  Bari Edward, MD [facility]  12/20/2017

## 2017-12-21 LAB — CBC WITH DIFFERENTIAL/PLATELET
BASOS ABS: 0 10*3/uL (ref 0.0–0.2)
Basos: 0 %
EOS (ABSOLUTE): 0.1 10*3/uL (ref 0.0–0.4)
Eos: 2 %
Hematocrit: 50.8 % (ref 37.5–51.0)
Hemoglobin: 17.2 g/dL (ref 13.0–17.7)
IMMATURE GRANS (ABS): 0 10*3/uL (ref 0.0–0.1)
Immature Granulocytes: 0 %
LYMPHS ABS: 2.2 10*3/uL (ref 0.7–3.1)
LYMPHS: 25 %
MCH: 32.4 pg (ref 26.6–33.0)
MCHC: 33.9 g/dL (ref 31.5–35.7)
MCV: 96 fL (ref 79–97)
Monocytes Absolute: 0.8 10*3/uL (ref 0.1–0.9)
Monocytes: 9 %
Neutrophils Absolute: 5.6 10*3/uL (ref 1.4–7.0)
Neutrophils: 64 %
PLATELETS: 176 10*3/uL (ref 150–379)
RBC: 5.31 x10E6/uL (ref 4.14–5.80)
RDW: 13.6 % (ref 12.3–15.4)
WBC: 8.7 10*3/uL (ref 3.4–10.8)

## 2017-12-21 LAB — COMPREHENSIVE METABOLIC PANEL
ALK PHOS: 66 IU/L (ref 39–117)
ALT: 82 IU/L — AB (ref 0–44)
AST: 125 IU/L — ABNORMAL HIGH (ref 0–40)
Albumin/Globulin Ratio: 2.3 — ABNORMAL HIGH (ref 1.2–2.2)
Albumin: 4.4 g/dL (ref 3.5–5.5)
BILIRUBIN TOTAL: 0.7 mg/dL (ref 0.0–1.2)
BUN/Creatinine Ratio: 9 (ref 9–20)
BUN: 10 mg/dL (ref 6–24)
CO2: 23 mmol/L (ref 20–29)
Calcium: 9.7 mg/dL (ref 8.7–10.2)
Chloride: 102 mmol/L (ref 96–106)
Creatinine, Ser: 1.06 mg/dL (ref 0.76–1.27)
GFR calc Af Amer: 95 mL/min/{1.73_m2} (ref >59)
GFR calc non Af Amer: 82 mL/min/{1.73_m2} (ref >59)
Globulin, Total: 1.9 g/dL (ref 1.5–4.5)
Glucose: 179 mg/dL — ABNORMAL HIGH (ref 65–99)
Potassium: 4.4 mmol/L (ref 3.5–5.2)
Sodium: 139 mmol/L (ref 134–144)
Total Protein: 6.3 g/dL (ref 6.0–8.5)

## 2017-12-21 LAB — HEMOGLOBIN A1C
ESTIMATED AVERAGE GLUCOSE: 194 mg/dL
HEMOGLOBIN A1C: 8.4 % — AB (ref 4.8–5.6)

## 2017-12-21 LAB — LIPID PANEL
Chol/HDL Ratio: 4.6 ratio (ref 0.0–5.0)
Cholesterol, Total: 182 mg/dL (ref 100–199)
HDL: 40 mg/dL (ref >39)
LDL Calculated: 106 mg/dL — ABNORMAL HIGH (ref 0–99)
TRIGLYCERIDES: 179 mg/dL — AB (ref 0–149)
VLDL CHOLESTEROL CAL: 36 mg/dL (ref 5–40)

## 2017-12-24 LAB — MICROALBUMIN / CREATININE URINE RATIO
CREATININE, UR: 122.6 mg/dL
MICROALB/CREAT RATIO: 11.3 mg/g{creat} (ref 0.0–30.0)
MICROALBUM., U, RANDOM: 13.8 ug/mL

## 2018-01-15 ENCOUNTER — Other Ambulatory Visit: Payer: Self-pay | Admitting: Internal Medicine

## 2018-01-18 ENCOUNTER — Encounter: Payer: BLUE CROSS/BLUE SHIELD | Admitting: Internal Medicine

## 2018-01-20 ENCOUNTER — Encounter

## 2018-02-02 HISTORY — PX: COLONOSCOPY: SHX174

## 2018-02-02 HISTORY — PX: ESOPHAGOGASTRODUODENOSCOPY: SHX1529

## 2018-02-02 LAB — HM COLONOSCOPY

## 2018-02-22 ENCOUNTER — Other Ambulatory Visit: Payer: Self-pay | Admitting: Internal Medicine

## 2018-02-25 ENCOUNTER — Other Ambulatory Visit: Payer: Self-pay

## 2018-02-25 NOTE — Telephone Encounter (Signed)
Patient advised refill was sent 02/23/2018

## 2018-04-17 ENCOUNTER — Other Ambulatory Visit: Payer: Self-pay | Admitting: Internal Medicine

## 2018-04-18 ENCOUNTER — Other Ambulatory Visit: Payer: Self-pay | Admitting: Internal Medicine

## 2018-04-21 ENCOUNTER — Ambulatory Visit: Payer: BLUE CROSS/BLUE SHIELD | Admitting: Internal Medicine

## 2018-04-21 ENCOUNTER — Encounter: Payer: Self-pay | Admitting: Internal Medicine

## 2018-04-21 VITALS — BP 124/68 | HR 74 | Ht 73.0 in | Wt 217.0 lb

## 2018-04-21 DIAGNOSIS — K22719 Barrett's esophagus with dysplasia, unspecified: Secondary | ICD-10-CM | POA: Diagnosis not present

## 2018-04-21 DIAGNOSIS — E785 Hyperlipidemia, unspecified: Secondary | ICD-10-CM

## 2018-04-21 DIAGNOSIS — R945 Abnormal results of liver function studies: Secondary | ICD-10-CM | POA: Diagnosis not present

## 2018-04-21 DIAGNOSIS — E1165 Type 2 diabetes mellitus with hyperglycemia: Secondary | ICD-10-CM | POA: Diagnosis not present

## 2018-04-21 DIAGNOSIS — R7989 Other specified abnormal findings of blood chemistry: Secondary | ICD-10-CM

## 2018-04-21 DIAGNOSIS — IMO0001 Reserved for inherently not codable concepts without codable children: Secondary | ICD-10-CM

## 2018-04-21 DIAGNOSIS — E1169 Type 2 diabetes mellitus with other specified complication: Secondary | ICD-10-CM | POA: Diagnosis not present

## 2018-04-21 MED ORDER — METFORMIN HCL ER 500 MG PO TB24: 1000.0000 mg | ORAL_TABLET | Freq: Every day | ORAL | 1 refills | Status: DC

## 2018-04-21 MED ORDER — GLIMEPIRIDE 2 MG PO TABS: 2.0000 mg | ORAL_TABLET | Freq: Every day | ORAL | 1 refills | Status: DC

## 2018-04-21 NOTE — Progress Notes (Signed)
Date:  04/21/2018   Name:  Karl Barrett   DOB:  10-13-1970   MRN:  [id number]   Chief Complaint: Diabetes Diabetes  He presents for his follow-up diabetic visit. He has type 2 diabetes mellitus. His disease course has been fluctuating. Pertinent negatives for hypoglycemia include no headaches or tremors. Pertinent negatives for diabetes include no chest pain, no fatigue, no polydipsia and no polyuria. Current diabetic treatment includes oral agent (dual therapy). He is compliant with treatment most of the time.  Nicotine Dependence  Presents for follow-up visit. Symptoms are negative for fatigue. His urge triggers include company of smokers. The symptoms have been worsening.  He weaned of welbutrin as it did not seem to help.  He tried chantix briefly but it made him feel strange - he may try it again once his marital issues are sorted out. He has lost about 17 lbs with diet changes.  He does not check his BS. Lab Results  Component Value Date   HGBA1C 8.4 (H) 12/20/2017     Review of Systems  Constitutional: Negative for appetite change, fatigue and unexpected weight change (17 lbs with effort).  Eyes: Negative for visual disturbance.  Respiratory: Negative for cough, shortness of breath and wheezing.   Cardiovascular: Negative for chest pain, palpitations and leg swelling.  Gastrointestinal: Negative for abdominal pain and blood in stool.  Endocrine: Negative for polydipsia and polyuria.  Genitourinary: Negative for dysuria and hematuria.  Musculoskeletal: Negative for arthralgias and gait problem.  Skin: Negative for color change and rash.       Wart on left foot  Neurological: Negative for tremors, numbness and headaches.  Psychiatric/Behavioral: Negative for dysphoric mood and sleep disturbance.    Patient Active Problem List   Diagnosis Date Noted  . Hyperlipidemia associated with type 2 diabetes mellitus (HCC) 09/16/2017  . Arthritis of hand 09/16/2017  . History of  kidney stones 12/08/2016  . Tobacco use 12/04/2015  . Chronic infection of sinus 12/04/2015  . Plantar fasciitis 12/04/2015  . Barrett's esophagus with dysplasia 02/19/2015  . Carpal tunnel syndrome 02/19/2015  . Chronic peptic ulcer 02/19/2015  . Uncontrolled type 2 diabetes mellitus without complication, without long-term current use of insulin (HCC) 02/19/2015  . Arthritis of knee, degenerative 02/19/2015  . Recurrent genital herpes 02/19/2015    Allergies  Allergen Reactions  . Metformin And Related Diarrhea    Past Surgical History:  Procedure Laterality Date  . COLONOSCOPY  2008  . COLONOSCOPY  02/02/2018   Tubular adenoma  . ESOPHAGOGASTRODUODENOSCOPY  2010  . ESOPHAGOGASTRODUODENOSCOPY  02/02/2018   no dysplasia, no Barrett  . LITHOTRIPSY      Social History   Tobacco Use  . Smoking status: Current Every Day Smoker    Packs/day: 0.50    Years: 24.00    Pack years: 12.00  . Smokeless tobacco: Never Used  Substance Use Topics  . Alcohol use: Yes    Alcohol/week: 0.0 standard drinks  . Drug use: No     Medication list has been reviewed and updated.  Current Meds  Medication Sig  . atorvastatin (LIPITOR) 10 MG tablet Take 1 tablet (10 mg total) by mouth daily.  Marland Kitchen buPROPion (WELLBUTRIN SR) 150 MG 12 hr tablet Take 1 tablet (150 mg total) by mouth 2 (two) times daily.  Marland Kitchen esomeprazole (NEXIUM) 40 MG capsule TAKE 1 CAPSULE (40 MG TOTAL) BY MOUTH DAILY.  Marland Kitchen glimepiride (AMARYL) 2 MG tablet TAKE 1 TABLET (2 MG TOTAL) BY  MOUTH DAILY BEFORE BREAKFAST.  . metFORMIN (GLUCOPHAGE-XR) 500 MG 24 hr tablet TAKE 2 TABLETS (1,000 MG TOTAL) BY MOUTH DAILY WITH BREAKFAST.  . valACYclovir (VALTREX) 1000 MG tablet TAKE 1 TABLET (1,000 MG TOTAL) BY MOUTH DAILY.    PHQ 2/9 Scores 12/20/2017 12/08/2016  PHQ - 2 Score 0 0    Physical Exam  Constitutional: He is oriented to person, place, and time. He appears well-developed. No distress.  HENT:  Head: Normocephalic and  atraumatic.  Neck: Normal range of motion. Neck supple.  Cardiovascular: Normal rate, regular rhythm and normal heart sounds.  Pulmonary/Chest: Effort normal and breath sounds normal. No respiratory distress.  Musculoskeletal: Normal range of motion. He exhibits no edema or tenderness.  Neurological: He is alert and oriented to person, place, and time.  Skin: Skin is warm and dry. No rash noted.  Wart on sole of left foot  Psychiatric: He has a normal mood and affect. His behavior is normal. Thought content normal.  Nursing note and vitals reviewed.   BP 124/68 (BP Location: Right Arm, Patient Position: Sitting, Cuff Size: Large)   Pulse 74   Ht 6\' 1"  (1.854 m)   Wt 217 lb (98.4 kg)   SpO2 98%   BMI 28.63 kg/m   Assessment and Plan: 1. Uncontrolled type 2 diabetes mellitus without complication, without long-term current use of insulin (HCC) Continue medications, diet and weight loss Schedule eye exam - Hemoglobin A1c - Basic metabolic panel - metFORMIN (GLUCOPHAGE-XR) 500 MG 24 hr tablet; Take 2 tablets (1,000 mg total) by mouth daily with breakfast.  Dispense: 180 tablet; Refill: 1 - glimepiride (AMARYL) 2 MG tablet; Take 1 tablet (2 mg total) by mouth daily before breakfast.  Dispense: 90 tablet; Refill: 1  2. Hyperlipidemia associated with type 2 diabetes mellitus (HCC) Continue statin  3. Barrett's esophagus with dysplasia Recent EGD negative Continue PPI  4. Elevated LFTs Recheck labs - Hepatic function panel   Meds ordered this encounter  Medications  . metFORMIN (GLUCOPHAGE-XR) 500 MG 24 hr tablet    Sig: Take 2 tablets (1,000 mg total) by mouth daily with breakfast.    Dispense:  180 tablet    Refill:  1  . glimepiride (AMARYL) 2 MG tablet    Sig: Take 1 tablet (2 mg total) by mouth daily before breakfast.    Dispense:  90 tablet    Refill:  1    Partially dictated using Animal nutritionistDragon software. Any errors are unintentional.  Bari EdwardLaura Berglund, MD [facility]  04/21/2018

## 2018-04-22 LAB — BASIC METABOLIC PANEL
BUN/Creatinine Ratio: 14 (ref 9–20)
BUN: 11 mg/dL (ref 6–24)
CALCIUM: 9.5 mg/dL (ref 8.7–10.2)
CO2: 22 mmol/L (ref 20–29)
Chloride: 100 mmol/L (ref 96–106)
Creatinine, Ser: 0.81 mg/dL (ref 0.76–1.27)
GFR calc Af Amer: 121 mL/min/{1.73_m2} (ref >59)
GFR calc non Af Amer: 104 mL/min/{1.73_m2} (ref >59)
GLUCOSE: 112 mg/dL — AB (ref 65–99)
POTASSIUM: 4.3 mmol/L (ref 3.5–5.2)
Sodium: 139 mmol/L (ref 134–144)

## 2018-04-22 LAB — HEMOGLOBIN A1C
Est. average glucose Bld gHb Est-mCnc: 137 mg/dL
HEMOGLOBIN A1C: 6.4 % — AB (ref 4.8–5.6)

## 2018-04-22 LAB — HEPATIC FUNCTION PANEL
ALT: 24 IU/L (ref 0–44)
AST: 14 IU/L (ref 0–40)
Albumin: 4.6 g/dL (ref 3.5–5.5)
Alkaline Phosphatase: 63 IU/L (ref 39–117)
Bilirubin Total: 1.2 mg/dL (ref 0.0–1.2)
Bilirubin, Direct: 0.28 mg/dL (ref 0.00–0.40)
Total Protein: 6.7 g/dL (ref 6.0–8.5)

## 2018-09-07 LAB — HM DIABETES EYE EXAM

## 2018-09-16 ENCOUNTER — Other Ambulatory Visit: Payer: Self-pay | Admitting: Internal Medicine

## 2018-09-17 ENCOUNTER — Other Ambulatory Visit: Payer: Self-pay | Admitting: Internal Medicine

## 2018-09-17 DIAGNOSIS — E785 Hyperlipidemia, unspecified: Principal | ICD-10-CM

## 2018-09-17 DIAGNOSIS — E1169 Type 2 diabetes mellitus with other specified complication: Secondary | ICD-10-CM

## 2018-09-26 ENCOUNTER — Encounter: Payer: Self-pay | Admitting: Internal Medicine

## 2018-09-26 ENCOUNTER — Ambulatory Visit (INDEPENDENT_AMBULATORY_CARE_PROVIDER_SITE_OTHER): Payer: BLUE CROSS/BLUE SHIELD | Admitting: Internal Medicine

## 2018-09-26 ENCOUNTER — Other Ambulatory Visit: Payer: Self-pay

## 2018-09-26 ENCOUNTER — Other Ambulatory Visit: Payer: Self-pay | Admitting: Internal Medicine

## 2018-09-26 VITALS — BP 120/78 | HR 74 | Ht 73.0 in | Wt 225.0 lb

## 2018-09-26 DIAGNOSIS — E1169 Type 2 diabetes mellitus with other specified complication: Secondary | ICD-10-CM | POA: Diagnosis not present

## 2018-09-26 DIAGNOSIS — E1165 Type 2 diabetes mellitus with hyperglycemia: Secondary | ICD-10-CM

## 2018-09-26 DIAGNOSIS — Z720 Tobacco use: Secondary | ICD-10-CM | POA: Diagnosis not present

## 2018-09-26 DIAGNOSIS — E785 Hyperlipidemia, unspecified: Secondary | ICD-10-CM

## 2018-09-26 DIAGNOSIS — Z23 Encounter for immunization: Secondary | ICD-10-CM | POA: Diagnosis not present

## 2018-09-26 DIAGNOSIS — Z125 Encounter for screening for malignant neoplasm of prostate: Secondary | ICD-10-CM

## 2018-09-26 DIAGNOSIS — Z0000 Encounter for general adult medical examination without abnormal findings: Secondary | ICD-10-CM | POA: Diagnosis not present

## 2018-09-26 DIAGNOSIS — IMO0001 Reserved for inherently not codable concepts without codable children: Secondary | ICD-10-CM

## 2018-09-26 DIAGNOSIS — K22719 Barrett's esophagus with dysplasia, unspecified: Secondary | ICD-10-CM

## 2018-09-26 LAB — POCT URINALYSIS DIPSTICK
BILIRUBIN UA: NEGATIVE
Blood, UA: NEGATIVE
Glucose, UA: NEGATIVE
Ketones, UA: NEGATIVE
Leukocytes, UA: NEGATIVE
Nitrite, UA: NEGATIVE
Protein, UA: NEGATIVE
Spec Grav, UA: 1.01 (ref 1.010–1.025)
Urobilinogen, UA: 0.2 E.U./dL
pH, UA: 6.5 (ref 5.0–8.0)

## 2018-09-26 MED ORDER — VARENICLINE TARTRATE 1 MG PO TABS: 1.0000 mg | ORAL_TABLET | Freq: Two times a day (BID) | ORAL | 3 refills | Status: DC

## 2018-09-26 MED ORDER — GLIMEPIRIDE 2 MG PO TABS: 2.0000 mg | ORAL_TABLET | Freq: Every day | ORAL | 1 refills | Status: DC

## 2018-09-26 MED ORDER — METFORMIN HCL ER 500 MG PO TB24: 1000.0000 mg | ORAL_TABLET | Freq: Every day | ORAL | 1 refills | Status: DC

## 2018-09-26 NOTE — Progress Notes (Signed)
Date:  09/26/2018   Name:  Karl Barrett   DOB:  10-Mar-1971   MRN:  [id number]   Chief Complaint: Annual Exam Karl Barrett is a 48 y.o. male who presents today for his Complete Annual Exam. He feels well. 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 He reports he is sleeping well.  Tobacco use - he continues on wellbutrin but want to add chantix.  He used in the past but has a goal to quit now that he is turning 48.   Diabetes  He presents for his follow-up diabetic visit. He has type 2 diabetes mellitus. His disease course has been stable. Pertinent negatives for hypoglycemia include no dizziness or headaches. Pertinent negatives for diabetes include no chest pain and no fatigue. There are no hypoglycemic complications. Pertinent negatives for diabetic complications include no peripheral neuropathy. Current diabetic treatment includes oral agent (dual therapy). He is compliant with treatment most of the time. He participates in exercise daily. There is no compliance with monitoring of blood glucose. An ACE inhibitor/angiotensin II receptor blocker is not being taken. Eye exam is current.  Hyperlipidemia  The problem is controlled. Pertinent negatives include no chest pain, myalgias or shortness of breath. Current antihyperlipidemic treatment includes statins. The current treatment provides significant improvement of lipids.  Gastroesophageal Reflux  He complains of heartburn. He reports no abdominal pain, no chest pain, no choking or no wheezing. This is a recurrent problem. The problem occurs occasionally. Pertinent negatives include no fatigue. Risk factors include Barrett's esophagus. 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 Past procedures include an EGD (recent EGD with no Barretts).   Lab Results  Component Value Date   HGBA1C 6.4 (H) 04/21/2018     Review of Systems  Constitutional: Negative for appetite change, chills, diaphoresis, fatigue  and unexpected weight change.  HENT: Negative for hearing loss, tinnitus, trouble swallowing and voice change.   Eyes: Negative for visual disturbance.  Respiratory: Negative for choking, shortness of breath and wheezing.   Cardiovascular: Negative for chest pain, palpitations and leg swelling.  Gastrointestinal: Positive for heartburn. Negative for abdominal pain, blood in stool, constipation and diarrhea.  Genitourinary: Negative for difficulty urinating, dysuria, frequency, hematuria and urgency.  Musculoskeletal: Negative for arthralgias, back pain and myalgias.  Skin: Positive for rash (athletes foot). Negative for color change.  Allergic/Immunologic: Negative for environmental allergies.  Neurological: Negative for dizziness, syncope and headaches.  Hematological: Negative for adenopathy.  Psychiatric/Behavioral: Negative for dysphoric mood and sleep disturbance.    Patient Active Problem List   Diagnosis Date Noted  . Hyperlipidemia associated with type 2 diabetes mellitus (HCC) 09/16/2017  . Arthritis of hand 09/16/2017  . History of kidney stones 12/08/2016  . Tobacco use 12/04/2015  . Chronic infection of sinus 12/04/2015  . Plantar fasciitis 12/04/2015  . Barrett's esophagus with dysplasia 02/19/2015  . Carpal tunnel syndrome 02/19/2015  . Chronic peptic ulcer 02/19/2015  . Uncontrolled type 2 diabetes mellitus without complication, without long-term current use of insulin (HCC) 02/19/2015  . Arthritis of knee, degenerative 02/19/2015  . Recurrent genital herpes 02/19/2015    Allergies  Allergen Reactions  . Metformin And Related Diarrhea    Past Surgical History:  Procedure Laterality Date  . COLONOSCOPY  2008  . COLONOSCOPY  02/02/2018   Tubular adenoma  . ESOPHAGOGASTRODUODENOSCOPY  2010  . ESOPHAGOGASTRODUODENOSCOPY  02/02/2018   no dysplasia, no Barrett  . LITHOTRIPSY  Social History   Tobacco Use  . Smoking status: Current Every Day Smoker     Packs/day: 1.00    Years: 24.00    Pack years: 24.00  . Smokeless tobacco: Never Used  Substance Use Topics  . Alcohol use: Yes    Alcohol/week: 0.0 standard drinks  . Drug use: No     Medication list has been reviewed and updated.  Current Meds  Medication Sig  . atorvastatin (LIPITOR) 10 MG tablet TAKE 1 TABLET BY MOUTH EVERY DAY  . esomeprazole (NEXIUM) 40 MG capsule TAKE 1 CAPSULE (40 MG TOTAL) BY MOUTH DAILY.  Marland Kitchen glimepiride (AMARYL) 2 MG tablet Take 1 tablet (2 mg total) by mouth daily before breakfast.  . metFORMIN (GLUCOPHAGE-XR) 500 MG 24 hr tablet Take 2 tablets (1,000 mg total) by mouth daily with breakfast.  . valACYclovir (VALTREX) 1000 MG tablet TAKE 1 TABLET (1,000 MG TOTAL) BY MOUTH DAILY.    PHQ 2/9 Scores 09/26/2018 12/20/2017 12/08/2016  PHQ - 2 Score 1 0 0   Wt Readings from Last 3 Encounters:  09/26/18 225 lb (102.1 kg)  04/21/18 217 lb (98.4 kg)  12/20/17 234 lb (106.1 kg)    Physical Exam Vitals signs and nursing note reviewed.  Constitutional:      Appearance: Normal appearance. He is well-developed.  HENT:     Head: Normocephalic.     Right Ear: Tympanic membrane, ear canal and external ear normal.     Left Ear: Tympanic membrane, ear canal and external ear normal.     Nose: Nose normal.     Mouth/Throat:     Pharynx: Uvula midline.  Eyes:     Conjunctiva/sclera: Conjunctivae normal.     Pupils: Pupils are equal, round, and reactive to light.   Neck:     Musculoskeletal: Normal range of motion and neck supple.     Thyroid: No thyromegaly.     Vascular: No carotid bruit.  Cardiovascular:     Rate and Rhythm: Normal rate and regular rhythm.     Heart sounds: Normal heart sounds.  Pulmonary:     Effort: Pulmonary effort is normal.     Breath sounds: Normal breath sounds. No wheezing.  Chest:     Breasts:        Right: No mass.        Left: No mass.  Abdominal:     General: Bowel sounds are normal.     Palpations: Abdomen is soft.      Tenderness: There is no abdominal tenderness.  Musculoskeletal: Normal range of motion.  Lymphadenopathy:     Cervical: No cervical adenopathy.  Skin:    General: Skin is warm and dry.  Neurological:     Mental Status: He is alert and oriented to person, place, and time.     Deep Tendon Reflexes: Reflexes are normal and symmetric.  Psychiatric:        Speech: Speech normal.        Behavior: Behavior normal.        Thought Content: Thought content normal.        Judgment: Judgment normal.     BP 120/78   Pulse 74   Ht 6\' 1"  (1.854 m)   Wt 225 lb (102.1 kg)   SpO2 97%   BMI 29.69 kg/m   Assessment and Plan: 1. Annual physical exam Normal exam except for mild weight gain - POCT urinalysis dipstick  2. Prostate cancer screening DRE deferred to no sx -  PSA  3. Barrett's esophagus with dysplasia Controlled on nexium - CBC with Differential/Platelet  4. Uncontrolled type 2 diabetes mellitus without complication, without long-term current use of insulin (HCC) Continue current medication Work on diet - Comprehensive metabolic panel - Hemoglobin A1c - Microalbumin / creatinine urine ratio - metFORMIN (GLUCOPHAGE-XR) 500 MG 24 hr tablet; Take 2 tablets (1,000 mg total) by mouth daily with breakfast.  Dispense: 180 tablet; Refill: 1 - glimepiride (AMARYL) 2 MG tablet; Take 1 tablet (2 mg total) by mouth daily before breakfast.  Dispense: 90 tablet; Refill: 1  5. Hyperlipidemia associated with type 2 diabetes mellitus (HCC) On statin therapy - Lipid panel  6. Tobacco use Wants to try chantix again - varenicline (CHANTIX CONTINUING MONTH PAK) 1 MG tablet; Take 1 tablet (1 mg total) by mouth 2 (two) times daily.  Dispense: 60 tablet; Refill: 3  7. Need for vaccination for pneumococcus - Pneumococcal polysaccharide vaccine 23-valent greater than or equal to 2yo subcutaneous/IM  8. Need for immunization against influenza - Flu Vaccine QUAD 36+ mos IM   Partially dictated  using Animal nutritionistDragon software. Any errors are unintentional.  Bari EdwardLaura Aura Bibby, MD [facility]  09/26/2018

## 2018-09-28 LAB — CBC WITH DIFFERENTIAL/PLATELET
Basophils Absolute: 0.1 10*3/uL (ref 0.0–0.2)
Basos: 1 %
EOS (ABSOLUTE): 0.2 10*3/uL (ref 0.0–0.4)
EOS: 2 %
HEMATOCRIT: 52.4 % — AB (ref 37.5–51.0)
HEMOGLOBIN: 18.8 g/dL — AB (ref 13.0–17.7)
IMMATURE GRANS (ABS): 0 10*3/uL (ref 0.0–0.1)
IMMATURE GRANULOCYTES: 0 %
Lymphocytes Absolute: 2 10*3/uL (ref 0.7–3.1)
Lymphs: 23 %
MCH: 34.1 pg — ABNORMAL HIGH (ref 26.6–33.0)
MCHC: 35.9 g/dL — ABNORMAL HIGH (ref 31.5–35.7)
MCV: 95 fL (ref 79–97)
MONOCYTES: 10 %
Monocytes Absolute: 0.8 10*3/uL (ref 0.1–0.9)
NEUTROS PCT: 64 %
Neutrophils Absolute: 5.6 10*3/uL (ref 1.4–7.0)
Platelets: 167 10*3/uL (ref 150–450)
RBC: 5.52 x10E6/uL (ref 4.14–5.80)
RDW: 12.1 % (ref 11.6–15.4)
WBC: 8.7 10*3/uL (ref 3.4–10.8)

## 2018-09-28 LAB — LIPID PANEL
Chol/HDL Ratio: 2.6 ratio (ref 0.0–5.0)
Cholesterol, Total: 136 mg/dL (ref 100–199)
HDL: 52 mg/dL (ref >39)
LDL Calculated: 67 mg/dL (ref 0–99)
Triglycerides: 87 mg/dL (ref 0–149)
VLDL Cholesterol Cal: 17 mg/dL (ref 5–40)

## 2018-09-28 LAB — COMPREHENSIVE METABOLIC PANEL
ALBUMIN: 4.7 g/dL (ref 4.0–5.0)
ALT: 28 IU/L (ref 0–44)
AST: 14 IU/L (ref 0–40)
Albumin/Globulin Ratio: 2.4 — ABNORMAL HIGH (ref 1.2–2.2)
Alkaline Phosphatase: 68 IU/L (ref 39–117)
BUN/Creatinine Ratio: 18 (ref 9–20)
BUN: 15 mg/dL (ref 6–24)
Bilirubin Total: 0.9 mg/dL (ref 0.0–1.2)
CALCIUM: 10.1 mg/dL (ref 8.7–10.2)
CO2: 24 mmol/L (ref 20–29)
CREATININE: 0.83 mg/dL (ref 0.76–1.27)
Chloride: 101 mmol/L (ref 96–106)
GFR calc Af Amer: 119 mL/min/{1.73_m2} (ref >59)
GFR, EST NON AFRICAN AMERICAN: 103 mL/min/{1.73_m2} (ref >59)
GLOBULIN, TOTAL: 2 g/dL (ref 1.5–4.5)
Glucose: 154 mg/dL — ABNORMAL HIGH (ref 65–99)
Potassium: 4.6 mmol/L (ref 3.5–5.2)
SODIUM: 139 mmol/L (ref 134–144)
Total Protein: 6.7 g/dL (ref 6.0–8.5)

## 2018-09-28 LAB — PSA: Prostate Specific Ag, Serum: 0.3 ng/mL (ref 0.0–4.0)

## 2018-09-28 LAB — HEMOGLOBIN A1C
Est. average glucose Bld gHb Est-mCnc: 163 mg/dL
Hgb A1c MFr Bld: 7.3 % — ABNORMAL HIGH (ref 4.8–5.6)

## 2018-09-29 LAB — MICROALBUMIN / CREATININE URINE RATIO
Creatinine, Urine: 149 mg/dL
Microalb/Creat Ratio: 8 mg/g creat (ref 0–29)
Microalbumin, Urine: 12 ug/mL

## 2018-10-13 ENCOUNTER — Encounter: Payer: Self-pay | Admitting: Internal Medicine

## 2019-01-26 ENCOUNTER — Ambulatory Visit: Payer: BLUE CROSS/BLUE SHIELD | Admitting: Internal Medicine

## 2019-02-22 ENCOUNTER — Ambulatory Visit (INDEPENDENT_AMBULATORY_CARE_PROVIDER_SITE_OTHER): Payer: BC Managed Care – PPO | Admitting: Internal Medicine

## 2019-02-22 ENCOUNTER — Encounter: Payer: Self-pay | Admitting: Internal Medicine

## 2019-02-22 ENCOUNTER — Other Ambulatory Visit: Payer: Self-pay

## 2019-02-22 VITALS — BP 136/70 | HR 73 | Ht 73.0 in | Wt 217.4 lb

## 2019-02-22 DIAGNOSIS — F321 Major depressive disorder, single episode, moderate: Secondary | ICD-10-CM

## 2019-02-22 DIAGNOSIS — B353 Tinea pedis: Secondary | ICD-10-CM | POA: Diagnosis not present

## 2019-02-22 DIAGNOSIS — IMO0001 Reserved for inherently not codable concepts without codable children: Secondary | ICD-10-CM

## 2019-02-22 DIAGNOSIS — B354 Tinea corporis: Secondary | ICD-10-CM

## 2019-02-22 DIAGNOSIS — M778 Other enthesopathies, not elsewhere classified: Secondary | ICD-10-CM | POA: Diagnosis not present

## 2019-02-22 DIAGNOSIS — E1165 Type 2 diabetes mellitus with hyperglycemia: Secondary | ICD-10-CM

## 2019-02-22 MED ORDER — METFORMIN HCL ER 500 MG PO TB24: 1000.0000 mg | ORAL_TABLET | Freq: Every day | ORAL | 1 refills | Status: DC

## 2019-02-22 MED ORDER — ESCITALOPRAM OXALATE 10 MG PO TABS: 10.0000 mg | ORAL_TABLET | Freq: Every day | ORAL | 1 refills | Status: DC

## 2019-02-22 MED ORDER — GLIMEPIRIDE 2 MG PO TABS: 2.0000 mg | ORAL_TABLET | Freq: Every day | ORAL | 1 refills | Status: DC

## 2019-02-22 MED ORDER — TERBINAFINE HCL 250 MG PO TABS: 250.0000 mg | ORAL_TABLET | Freq: Every day | ORAL | 0 refills | Status: AC

## 2019-02-22 NOTE — Progress Notes (Signed)
Date:  02/22/2019   Name:  Karl SchwabRalph Eugene Barrett   DOB:  11/29/1970   MRN:  [id number]   Chief Complaint: Diabetes (4 month follow up.), Rash (On right buttock. Wonders if he has ringworm. Athletes foot is bad too. Itching. No pain. ), and Depression (15- PHQ9, 14- GAD7)  Diabetes He presents for his follow-up diabetic visit. He has type 2 diabetes mellitus. His disease course has been stable. Hypoglycemia symptoms include nervousness/anxiousness. Associated symptoms include fatigue. Current diabetic treatment includes oral agent (dual therapy). He is compliant with treatment all of the time. There is no compliance with monitoring of blood glucose.  Rash This is a recurrent problem. The affected locations include the back, left foot and right foot. The rash is characterized by itchiness and redness. Associated symptoms include fatigue. Pertinent negatives include no cough or shortness of breath. Treatments tried: otc anti fungals help but he has to use them continuously.  Depression        This is a new problem.  The current episode started more than 1 month ago.   The problem occurs daily.  The problem has been gradually worsening since onset.  Associated symptoms include decreased concentration, fatigue, hopelessness, decreased interest and sad.  Associated symptoms include no suicidal ideas.     The symptoms are aggravated by social issues, work stress and family issues.  Treatments tried: bupropion in the past for smoking cessation.  Lab Results  Component Value Date   HGBA1C 7.3 (H) 09/26/2018     Review of Systems  Constitutional: Positive for fatigue. Negative for chills and unexpected weight change.  Respiratory: Negative for cough, chest tightness, shortness of breath and wheezing.   Skin: Positive for rash.  Allergic/Immunologic: Negative for environmental allergies.  Psychiatric/Behavioral: Positive for decreased concentration, depression, dysphoric mood and sleep disturbance.  Negative for suicidal ideas. The patient is nervous/anxious.     Patient Active Problem List   Diagnosis Date Noted  . Hyperlipidemia associated with type 2 diabetes mellitus (HCC) 09/16/2017  . Arthritis of hand 09/16/2017  . History of kidney stones 12/08/2016  . Tobacco use 12/04/2015  . Chronic infection of sinus 12/04/2015  . Plantar fasciitis 12/04/2015  . Barrett's esophagus with dysplasia 02/19/2015  . Carpal tunnel syndrome 02/19/2015  . Chronic peptic ulcer 02/19/2015  . Uncontrolled type 2 diabetes mellitus without complication, without long-term current use of insulin (HCC) 02/19/2015  . Arthritis of knee, degenerative 02/19/2015  . Recurrent genital herpes 02/19/2015    Allergies  Allergen Reactions  . Metformin And Related Diarrhea    Past Surgical History:  Procedure Laterality Date  . COLONOSCOPY  2008  . COLONOSCOPY  02/02/2018   Tubular adenoma  . ESOPHAGOGASTRODUODENOSCOPY  2010  . ESOPHAGOGASTRODUODENOSCOPY  02/02/2018   no dysplasia, no Barrett  . LITHOTRIPSY      Social History   Tobacco Use  . Smoking status: Current Every Day Smoker    Packs/day: 1.00    Years: 24.00    Pack years: 24.00    Types: Cigarettes  . Smokeless tobacco: Never Used  Substance Use Topics  . Alcohol use: Yes    Alcohol/week: 0.0 standard drinks  . Drug use: No     Medication list has been reviewed and updated.  Current Meds  Medication Sig  . atorvastatin (LIPITOR) 10 MG tablet TAKE 1 TABLET BY MOUTH EVERY DAY  . esomeprazole (NEXIUM) 40 MG capsule TAKE 1 CAPSULE (40 MG TOTAL) BY MOUTH DAILY.  Marland Kitchen. glimepiride (AMARYL)  2 MG tablet Take 1 tablet (2 mg total) by mouth daily before breakfast.  . metFORMIN (GLUCOPHAGE-XR) 500 MG 24 hr tablet Take 2 tablets (1,000 mg total) by mouth daily with breakfast.  . valACYclovir (VALTREX) 1000 MG tablet TAKE 1 TABLET (1,000 MG TOTAL) BY MOUTH DAILY.  Marland Kitchen. varenicline (CHANTIX CONTINUING MONTH PAK) 1 MG tablet Take 1 tablet (1 mg  total) by mouth 2 (two) times daily.    PHQ 2/9 Scores 02/22/2019 09/26/2018 12/20/2017 12/08/2016  PHQ - 2 Score 5 1 0 0  PHQ- 9 Score 15 - - -   GAD 7 : Generalized Anxiety Score 02/22/2019  Nervous, Anxious, on Edge 2  Control/stop worrying 1  Worry too much - different things 3  Trouble relaxing 2  Restless 2  Easily annoyed or irritable 3  Afraid - awful might happen 1  Total GAD 7 Score 14  Anxiety Difficulty Very difficult      BP Readings from Last 3 Encounters:  02/22/19 136/70  09/26/18 120/78  04/21/18 124/68    Physical Exam Constitutional:      Appearance: Normal appearance.  Neck:     Musculoskeletal: Normal range of motion.  Cardiovascular:     Rate and Rhythm: Normal rate and regular rhythm.     Pulses: Normal pulses.  Pulmonary:     Effort: Pulmonary effort is normal.     Breath sounds: No wheezing or rhonchi.  Musculoskeletal:        General: Tenderness (right elbow) present.     Right forearm: He exhibits tenderness and swelling.     Right lower leg: No edema.     Left lower leg: No edema.  Lymphadenopathy:     Cervical: No cervical adenopathy.  Skin:    General: Skin is warm and dry.     Capillary Refill: Capillary refill takes less than 2 seconds.     Findings: Rash present.          Comments: Skin of both feet examined - no redness or peeling skin but pt has itching over medial and lateral heels on both feet where he normally has fungal rash  Neurological:     Mental Status: He is alert.  Psychiatric:        Attention and Perception: Attention normal.        Mood and Affect: Mood is depressed.        Speech: Speech normal.        Thought Content: Thought content does not include suicidal plan.        Cognition and Memory: Cognition normal.        Judgment: Judgment normal.     Wt Readings from Last 3 Encounters:  02/22/19 217 lb 6.4 oz (98.6 kg)  09/26/18 225 lb (102.1 kg)  04/21/18 217 lb (98.4 kg)    BP 136/70   Pulse 73   Ht 6'  1" (1.854 m)   Wt 217 lb 6.4 oz (98.6 kg)   SpO2 96%   BMI 28.68 kg/m   Assessment and Plan: 1. Uncontrolled type 2 diabetes mellitus without complication, without long-term current use of insulin (HCC) Continue current medications - glimepiride (AMARYL) 2 MG tablet; Take 1 tablet (2 mg total) by mouth daily before breakfast.  Dispense: 90 tablet; Refill: 1 - metFORMIN (GLUCOPHAGE-XR) 500 MG 24 hr tablet; Take 2 tablets (1,000 mg total) by mouth daily with breakfast.  Dispense: 180 tablet; Refill: 1 - Comprehensive metabolic panel - Hemoglobin A1c  2. Tendonitis  of elbow, right Use ice and topical rubs - CBC with Differential/Platelet  3. Tinea corporis - terbinafine (LAMISIL) 250 MG tablet; Take 1 tablet (250 mg total) by mouth daily for 14 days.  Dispense: 14 tablet; Refill: 0  4. Tinea pedis of both feet - terbinafine (LAMISIL) 250 MG tablet; Take 1 tablet (250 mg total) by mouth daily for 14 days.  Dispense: 14 tablet; Refill: 0  5. Current moderate episode of major depressive disorder without prior episode ([place]) Begin medication Follow up one month - escitalopram (LEXAPRO) 10 MG tablet; Take 1 tablet (10 mg total) by mouth daily.  Dispense: 30 tablet; Refill: 1   Partially dictated using Editor, commissioning. Any errors are unintentional.  Halina Maidens, MD [employer] [facility]  02/22/2019

## 2019-02-22 NOTE — Patient Instructions (Signed)
Suicidal Feelings: How to Help Yourself Suicide is when you end your own life. There are many things you can do to help yourself feel better when struggling with these feelings. Many services and people are available to support you and others who struggle with similar feelings.  If you ever feel like you may hurt yourself or others, or have thoughts about taking your own life, get help right away. To get help:  Call your local emergency services (911 in the [place]  [employer]'s health and human services helpline (211 in the [place]  Go to your nearest emergency department.  Call a suicide hotline to speak with a trained counselor. The following suicide hotlines are available in the [place]: ? 1-800-273-TALK (1-800-273-8255). ? 1-800-SUICIDE (1-800-784-2433). ? 1-888-628-9454. This is a hotline for Spanish speakers. ? 1-800-799-4889. This is a hotline for TTY users. ? 1-866-4-U-TREVOR (1-866-488-7386). This is a hotline for lesbian, gay, bisexual, transgender, or questioning youth. ? For a list of hotlines in [place], visit www.suicide.org/hotlines/international/[place]-suicide-hotlines.html  Contact a crisis center or a local suicide prevention center. To find a crisis center or suicide prevention center: ? Call your local hospital, clinic, community service organization, mental health center, social service provider, or health department. Ask for help with connecting to a crisis center. ? For a list of crisis centers in the [place], visit: suicidepreventionlifeline.org ? For a list of crisis centers in [place], visit: suicideprevention.ca How to help yourself feel better   Promise yourself that you will not do anything extreme when you have suicidal feelings. Remember, there is hope. Many people have gotten through suicidal thoughts and feelings, and you can too. If you have had these feelings before, remind yourself that you can get through them again.  Let family, friends,  teachers, or counselors know how you are feeling. Try not to separate yourself from those who care about you and want to help you. Talk with someone every day, even if you do not feel sociable. Face-to-face conversation is best to help them understand your feelings.  Contact a mental health care provider and work with this person regularly.  Make a safety plan that you can follow during a crisis. Include phone numbers of suicide prevention hotlines, mental health professionals, and trusted friends and family members you can call during an emergency. Save these numbers on your phone.  If you are thinking of taking a lot of medicine, give your medicine to someone who can give it to you as prescribed. If you are on antidepressants and are concerned you will overdose, tell your health care provider so that he or she can give you safer medicines.  Try to stick to your routines. Follow a schedule every day. Make self-care a priority.  Make a list of realistic goals, and cross them off when you achieve them. Accomplishments can give you a sense of worth.  Wait until you are feeling better before doing things that you find difficult or unpleasant.  Do things that you have always enjoyed to take your mind off your feelings. Try reading a book, or listening to or playing music. Spending time outside, in nature, may help you feel better. Follow these instructions at home:   Visit your primary health care provider every year for a checkup.  Work with a mental health care provider as needed.  Eat a well-balanced diet, and eat regular meals.  Get plenty of rest.  Exercise if you are able. Just 30 minutes of exercise each day can   help you feel better.  Take over-the-counter and prescription medicines only as told by your health care provider. Ask your mental health care provider about the possible side effects of any medicines you are taking.  Do not use alcohol or drugs, and remove these substances  from your home.  Remove weapons, poisons, knives, and other deadly items from your home. General recommendations  Keep your living space well lit.  When you are feeling well, write yourself a letter with tips and support that you can read when you are not feeling well.  Remember that life's difficulties can be sorted out with help. Conditions can be treated, and you can learn behaviors and ways of thinking that will help you. Where to find more information  National Suicide Prevention Lifeline: www.suicidepreventionlifeline.org  Hopeline: www.hopeline.com  American Foundation for Suicide Prevention: www.afsp.org  The Trevor Project (for lesbian, gay, bisexual, transgender, or questioning youth): www.thetrevorproject.org Contact a health care provider if:  You feel as though you are a burden to others.  You feel agitated, angry, vengeful, or have extreme mood swings.  You have withdrawn from family and friends. Get help right away if:  You are talking about suicide or wishing to die.  You start making plans for how to commit suicide.  You feel that you have no reason to live.  You start making plans for putting your affairs in order, saying goodbye, or giving your possessions away.  You feel guilt, shame, or unbearable pain, and it seems like there is no way out.  You are frequently using drugs or alcohol.  You are engaging in risky behaviors that could lead to death. If you have any of these symptoms, get help right away. Call emergency services, go to your nearest emergency department or crisis center, or call a suicide crisis helpline. Summary  Suicide is when you take your own life.  Promise yourself that you will not do anything extreme when you have suicidal feelings.  Let family, friends, teachers, or counselors know how you are feeling.  Get help right away if you feel as though life is getting too tough to handle and you are thinking about suicide. This  information is not intended to replace advice given to you by your health care provider. Make sure you discuss any questions you have with your health care provider. Document Released: 02/14/2003 Document Revised: 12/01/2018 Document Reviewed: 03/23/2017 Elsevier Patient Education  2020 Elsevier Inc.  

## 2019-02-23 LAB — CBC WITH DIFFERENTIAL/PLATELET
Basophils Absolute: 0.1 10*3/uL (ref 0.0–0.2)
Basos: 1 %
EOS (ABSOLUTE): 0.1 10*3/uL (ref 0.0–0.4)
Eos: 1 %
Hematocrit: 52.1 % — ABNORMAL HIGH (ref 37.5–51.0)
Hemoglobin: 18 g/dL — ABNORMAL HIGH (ref 13.0–17.7)
Immature Grans (Abs): 0 10*3/uL (ref 0.0–0.1)
Immature Granulocytes: 0 %
Lymphocytes Absolute: 1.6 10*3/uL (ref 0.7–3.1)
Lymphs: 20 %
MCH: 33.1 pg — ABNORMAL HIGH (ref 26.6–33.0)
MCHC: 34.5 g/dL (ref 31.5–35.7)
MCV: 96 fL (ref 79–97)
Monocytes Absolute: 0.6 10*3/uL (ref 0.1–0.9)
Monocytes: 7 %
Neutrophils Absolute: 5.5 10*3/uL (ref 1.4–7.0)
Neutrophils: 71 %
Platelets: 154 10*3/uL (ref 150–450)
RBC: 5.44 x10E6/uL (ref 4.14–5.80)
RDW: 12.1 % (ref 11.6–15.4)
WBC: 7.8 10*3/uL (ref 3.4–10.8)

## 2019-02-23 LAB — COMPREHENSIVE METABOLIC PANEL
ALT: 22 IU/L (ref 0–44)
AST: 16 IU/L (ref 0–40)
Albumin/Globulin Ratio: 2.3 — ABNORMAL HIGH (ref 1.2–2.2)
Albumin: 4.2 g/dL (ref 4.0–5.0)
Alkaline Phosphatase: 61 IU/L (ref 39–117)
BUN/Creatinine Ratio: 13 (ref 9–20)
BUN: 10 mg/dL (ref 6–24)
Bilirubin Total: 1.2 mg/dL (ref 0.0–1.2)
CO2: 22 mmol/L (ref 20–29)
Calcium: 9.4 mg/dL (ref 8.7–10.2)
Chloride: 101 mmol/L (ref 96–106)
Creatinine, Ser: 0.76 mg/dL (ref 0.76–1.27)
GFR calc Af Amer: 123 mL/min/{1.73_m2} (ref >59)
GFR calc non Af Amer: 106 mL/min/{1.73_m2} (ref >59)
Globulin, Total: 1.8 g/dL (ref 1.5–4.5)
Glucose: 154 mg/dL — ABNORMAL HIGH (ref 65–99)
Potassium: 3.9 mmol/L (ref 3.5–5.2)
Sodium: 138 mmol/L (ref 134–144)
Total Protein: 6 g/dL (ref 6.0–8.5)

## 2019-02-23 LAB — HEMOGLOBIN A1C
Est. average glucose Bld gHb Est-mCnc: 148 mg/dL
Hgb A1c MFr Bld: 6.8 % — ABNORMAL HIGH (ref 4.8–5.6)

## 2019-03-11 ENCOUNTER — Other Ambulatory Visit: Payer: Self-pay | Admitting: Internal Medicine

## 2019-03-11 DIAGNOSIS — Z720 Tobacco use: Secondary | ICD-10-CM

## 2019-04-20 ENCOUNTER — Ambulatory Visit: Payer: BC Managed Care – PPO | Admitting: Internal Medicine

## 2019-04-20 ENCOUNTER — Encounter: Payer: Self-pay | Admitting: Internal Medicine

## 2019-04-20 ENCOUNTER — Other Ambulatory Visit: Payer: Self-pay

## 2019-04-20 VITALS — BP 128/78 | HR 61 | Ht 73.0 in | Wt 212.0 lb

## 2019-04-20 DIAGNOSIS — F321 Major depressive disorder, single episode, moderate: Secondary | ICD-10-CM | POA: Diagnosis not present

## 2019-04-20 DIAGNOSIS — Z23 Encounter for immunization: Secondary | ICD-10-CM

## 2019-04-20 DIAGNOSIS — L723 Sebaceous cyst: Secondary | ICD-10-CM | POA: Diagnosis not present

## 2019-04-20 MED ORDER — ESCITALOPRAM OXALATE 10 MG PO TABS: 10.0000 mg | ORAL_TABLET | Freq: Every day | ORAL | 1 refills | Status: DC

## 2019-04-20 NOTE — Progress Notes (Signed)
Date:  04/20/2019   Name:  Karl Barrett   DOB:  [birth year]   MRN:  [id number]   Chief Complaint: Depression (1 month follow up.)  Depression        This is a new problem.  The current episode started more than 1 month ago.   The problem has been gradually improving since onset.  Associated symptoms include no fatigue, no helplessness, no hopelessness, no decreased interest, no appetite change, no headaches, not sad and no suicidal ideas.  Past treatments include SSRIs - Selective serotonin reuptake inhibitors (lexapro 10 mg started last visit).  Compliance with treatment is good.  Previous treatment provided significant relief.  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  He tried to express it but nothing came out.  Review of Systems  Constitutional: Negative for appetite change, chills, fatigue, fever and unexpected weight change.  Respiratory: Negative for chest tightness, shortness of breath and wheezing.   Gastrointestinal: Negative for abdominal pain, diarrhea and nausea.  Genitourinary: Negative for difficulty urinating and frequency.  Musculoskeletal: Negative for arthralgias.  Skin:       Cyst on back  Neurological: Negative for dizziness and headaches.  Psychiatric/Behavioral: Positive for depression. Negative for dysphoric mood and suicidal ideas. The patient is not nervous/anxious.     Patient Active Problem List   Diagnosis Date Noted  . Hyperlipidemia associated with type 2 diabetes mellitus (HCC) 09/16/2017  . Arthritis of hand 09/16/2017  . History of kidney stones 12/08/2016  . Tobacco use 12/04/2015  . Chronic infection of sinus 12/04/2015  . Plantar fasciitis 12/04/2015  . Barrett's esophagus with dysplasia 02/19/2015  . Carpal tunnel syndrome 02/19/2015  . Chronic peptic ulcer 02/19/2015  . Type II diabetes mellitus with complication (HCC) 02/19/2015  . Arthritis of  knee, degenerative 02/19/2015  . Recurrent genital herpes 02/19/2015    Allergies  Allergen Reactions  . Metformin And Related Diarrhea    Past Surgical History:  Procedure Laterality Date  . COLONOSCOPY  2008  . COLONOSCOPY  02/02/2018   Tubular adenoma  . ESOPHAGOGASTRODUODENOSCOPY  2010  . ESOPHAGOGASTRODUODENOSCOPY  02/02/2018   no dysplasia, no Barrett  . LITHOTRIPSY      Social History   Tobacco Use  . Smoking status: Current Every Day Smoker    Packs/day: 1.00    Years: 24.00    Pack years: 24.00    Types: Cigarettes  . Smokeless tobacco: Never Used  . Tobacco comment: going to try chantix  Substance Use Topics  . Alcohol use: Yes    Alcohol/week: 0.0 standard drinks  . Drug use: No     Medication list has been reviewed and updated.  Current Meds  Medication Sig  . atorvastatin (LIPITOR) 10 MG tablet TAKE 1 TABLET BY MOUTH EVERY DAY  . CHANTIX CONTINUING MONTH PAK 1 MG tablet TAKE 1 TABLET BY MOUTH TWICE A DAY  . escitalopram (LEXAPRO) 10 MG tablet Take 1 tablet (10 mg total) by mouth daily.  Marland Kitchen esomeprazole (NEXIUM) 40 MG capsule TAKE 1 CAPSULE (40 MG TOTAL) BY MOUTH DAILY.  Marland Kitchen glimepiride (AMARYL) 2 MG tablet Take 1 tablet (2 mg total) by mouth daily before breakfast.  . valACYclovir (VALTREX) 1000 MG tablet TAKE 1 TABLET (1,000 MG TOTAL) BY MOUTH DAILY.    PHQ 2/9 Scores 04/20/2019 02/22/2019 09/26/2018 12/20/2017  PHQ - 2 Score 2 5  1 0  PHQ- 9 Score 6 15 - -    BP Readings from Last 3 Encounters:  04/20/19 128/78  02/22/19 136/70  09/26/18 120/78    Physical Exam Vitals signs and nursing note reviewed.  Constitutional:      General: He is not in acute distress.    Appearance: He is well-developed.  HENT:     Head: Normocephalic and atraumatic.  Cardiovascular:     Rate and Rhythm: Normal rate and regular rhythm.     Pulses: Normal pulses.     Heart sounds: No murmur.  Pulmonary:     Effort: Pulmonary effort is normal. No respiratory distress.      Breath sounds: No wheezing or rhonchi.  Musculoskeletal: Normal range of motion.     Right lower leg: No edema.     Left lower leg: No edema.  Skin:    General: Skin is warm and dry.     Capillary Refill: Capillary refill takes less than 2 seconds.     Findings: No rash.       Neurological:     Mental Status: He is alert and oriented to person, place, and time.  Psychiatric:        Behavior: Behavior normal.        Thought Content: Thought content normal.     Wt Readings from Last 3 Encounters:  04/20/19 212 lb (96.2 kg)  02/22/19 217 lb 6.4 oz (98.6 kg)  09/26/18 225 lb (102.1 kg)    BP 128/78   Pulse 61   Ht 6\' 1"  (1.854 m)   Wt 212 lb (96.2 kg)   SpO2 97%   BMI 27.97 kg/m   Assessment and Plan: 1. Current moderate episode of major depressive disorder without prior episode ([place]) Clinically much improved and satisfied with his response He denies side effects such as weight gain, insomnia, n/v.  He has noticed mild delayed ejaculation which is not a problem. - escitalopram (LEXAPRO) 10 MG tablet; Take 1 tablet (10 mg total) by mouth daily.  Dispense: 90 tablet; Refill: 1  2. Sebaceous cyst Inflamed from pt manipulation Does not appear to be infected If persistently elevated, would refer to General surgery  3. Need for influenza vaccination - Flu Vaccine QUAD 36+ mos IM   Partially dictated using Editor, commissioning. Any errors are unintentional.  Halina Maidens, MD [facility]  04/20/2019

## 2019-04-26 ENCOUNTER — Other Ambulatory Visit: Payer: Self-pay | Admitting: Internal Medicine

## 2019-04-26 DIAGNOSIS — IMO0001 Reserved for inherently not codable concepts without codable children: Secondary | ICD-10-CM

## 2019-04-27 ENCOUNTER — Other Ambulatory Visit: Payer: Self-pay | Admitting: Internal Medicine

## 2019-07-13 ENCOUNTER — Other Ambulatory Visit: Payer: Self-pay

## 2019-07-13 ENCOUNTER — Ambulatory Visit
Admission: RE | Admit: 2019-07-13 | Discharge: 2019-07-13 | Disposition: A | Discharge status: Home / Self Care | Payer: BC Managed Care – PPO | Attending: Internal Medicine | Admitting: Internal Medicine

## 2019-07-13 ENCOUNTER — Ambulatory Visit
Admission: RE | Admit: 2019-07-13 | Discharge: 2019-07-13 | Disposition: A | Discharge status: Home / Self Care | Payer: BC Managed Care – PPO | Source: Ambulatory Visit | Attending: Internal Medicine | Admitting: Internal Medicine

## 2019-07-13 ENCOUNTER — Encounter: Payer: Self-pay | Admitting: Internal Medicine

## 2019-07-13 ENCOUNTER — Ambulatory Visit: Payer: BC Managed Care – PPO | Admitting: Internal Medicine

## 2019-07-13 VITALS — BP 118/78 | HR 70 | Ht 73.0 in | Wt 223.0 lb

## 2019-07-13 DIAGNOSIS — R200 Anesthesia of skin: Secondary | ICD-10-CM | POA: Diagnosis not present

## 2019-07-13 DIAGNOSIS — E785 Hyperlipidemia, unspecified: Secondary | ICD-10-CM

## 2019-07-13 DIAGNOSIS — E1169 Type 2 diabetes mellitus with other specified complication: Secondary | ICD-10-CM | POA: Diagnosis not present

## 2019-07-13 DIAGNOSIS — F321 Major depressive disorder, single episode, moderate: Secondary | ICD-10-CM

## 2019-07-13 DIAGNOSIS — E118 Type 2 diabetes mellitus with unspecified complications: Secondary | ICD-10-CM

## 2019-07-13 NOTE — Patient Instructions (Signed)
Tylenol 500 mg 1-2 times per day for joint pain  Can also use topical rubs on hands and wrists

## 2019-07-13 NOTE — Progress Notes (Signed)
Date:  07/13/2019   Name:  Karl Barrett   DOB:  09/22/1970   MRN:  [id number]   Chief Complaint: Diabetes and Hypertension  Diabetes He presents for his follow-up diabetic visit. He has type 2 diabetes mellitus. His disease course has been stable. Pertinent negatives for hypoglycemia include no dizziness, headaches or tremors. Pertinent negatives for diabetes include no chest pain, no fatigue, no polydipsia and no polyuria. Current diabetic treatment includes oral agent (dual therapy) (metformin and glimepiride - quit metformin due to recall but not instructed to stop). He is compliant with treatment most of the time. He is following a generally unhealthy diet. There is no compliance with monitoring of blood glucose. An ACE inhibitor/angiotensin II receptor blocker is not being taken. Eye exam is current.  Depression        This is a new problem.  The current episode started more than 1 month ago.   Associated symptoms include no fatigue, no hopelessness, no restlessness, no decreased interest, no appetite change, no myalgias, no headaches and no suicidal ideas.  Past treatments include SSRIs - Selective serotonin reuptake inhibitors.  Compliance with treatment is good. Numbness and tingling in right arm - this has been occurring intermittently for some months.  No known injury or inciting event.  He can not determine any particular activity or position that makes it worse.  Lab Results  Component Value Date   CREATININE 0.76 02/22/2019   BUN 10 02/22/2019   NA 138 02/22/2019   K 3.9 02/22/2019   CL 101 02/22/2019   CO2 22 02/22/2019   Lab Results  Component Value Date   CHOL 136 09/26/2018   HDL 52 09/26/2018   LDLCALC 67 09/26/2018   TRIG 87 09/26/2018   CHOLHDL 2.6 09/26/2018   Lab Results  Component Value Date   TSH 2.910 12/08/2016   Lab Results  Component Value Date   HGBA1C 6.8 (H) 02/22/2019     Review of Systems  Constitutional: Negative for appetite change,  fatigue and unexpected weight change.  Eyes: Negative for visual disturbance.  Respiratory: Negative for cough, shortness of breath and wheezing.   Cardiovascular: Negative for chest pain, palpitations and leg swelling.  Gastrointestinal: Negative for abdominal pain and blood in stool.  Endocrine: Negative for polydipsia and polyuria.  Genitourinary: Negative for dysuria and hematuria.  Musculoskeletal: Negative for myalgias.  Skin: Negative for color change and rash.  Neurological: Positive for numbness (in right arm intermittent). Negative for dizziness, tremors and headaches.  Psychiatric/Behavioral: Positive for depression. Negative for dysphoric mood and suicidal ideas.    Patient Active Problem List   Diagnosis Date Noted  . Current moderate episode of major depressive disorder without prior episode (HCC) 07/13/2019  . Hyperlipidemia associated with type 2 diabetes mellitus (HCC) 09/16/2017  . Arthritis of hand 09/16/2017  . History of kidney stones 12/08/2016  . Tobacco use 12/04/2015  . Chronic infection of sinus 12/04/2015  . Plantar fasciitis 12/04/2015  . Barrett's esophagus with dysplasia 02/19/2015  . Carpal tunnel syndrome 02/19/2015  . Chronic peptic ulcer 02/19/2015  . Type II diabetes mellitus with complication (HCC) 02/19/2015  . Arthritis of knee, degenerative 02/19/2015  . Recurrent genital herpes 02/19/2015    No Active Allergies  Past Surgical History:  Procedure Laterality Date  . COLONOSCOPY  2008  . COLONOSCOPY  02/02/2018   Tubular adenoma  . ESOPHAGOGASTRODUODENOSCOPY  2010  . ESOPHAGOGASTRODUODENOSCOPY  02/02/2018   no dysplasia, no Barrett  . LITHOTRIPSY  Social History   Tobacco Use  . Smoking status: Current Every Day Smoker    Packs/day: 1.00    Years: 24.00    Pack years: 24.00    Types: Cigarettes  . Smokeless tobacco: Never Used  . Tobacco comment: going to try chantix  Substance Use Topics  . Alcohol use: Yes     Alcohol/week: 0.0 standard drinks  . Drug use: No     Medication list has been reviewed and updated.  Current Meds  Medication Sig  . atorvastatin (LIPITOR) 10 MG tablet TAKE 1 TABLET BY MOUTH EVERY DAY  . escitalopram (LEXAPRO) 10 MG tablet Take 1 tablet (10 mg total) by mouth daily.  Marland Kitchen esomeprazole (NEXIUM) 40 MG capsule TAKE 1 CAPSULE (40 MG TOTAL) BY MOUTH DAILY.  Marland Kitchen glimepiride (AMARYL) 2 MG tablet TAKE 1 TABLET (2 MG TOTAL) BY MOUTH DAILY BEFORE BREAKFAST.  . valACYclovir (VALTREX) 1000 MG tablet TAKE 1 TABLET BY MOUTH EVERY DAY    PHQ 2/9 Scores 07/13/2019 04/20/2019 02/22/2019 09/26/2018  PHQ - 2 Score 0 2 5 1   PHQ- 9 Score 0 6 15 -    BP Readings from Last 3 Encounters:  07/13/19 118/78  04/20/19 128/78  02/22/19 136/70    Physical Exam Vitals signs and nursing note reviewed.  Constitutional:      General: He is not in acute distress.    Appearance: Normal appearance. He is well-developed.  HENT:     Head: Normocephalic and atraumatic.  Neck:     Musculoskeletal: Normal range of motion.     Vascular: No carotid bruit.  Cardiovascular:     Rate and Rhythm: Normal rate and regular rhythm.     Pulses: Normal pulses.     Heart sounds: No murmur.  Pulmonary:     Effort: Pulmonary effort is normal. No respiratory distress.  Musculoskeletal:     Right shoulder: Normal. He exhibits no tenderness and no bony tenderness.     Left shoulder: Normal.     Cervical back: He exhibits normal range of motion, no tenderness, no bony tenderness and no spasm.     Right lower leg: No edema.     Left lower leg: No edema.  Lymphadenopathy:     Cervical: No cervical adenopathy.  Skin:    General: Skin is warm and dry.     Capillary Refill: Capillary refill takes less than 2 seconds.     Findings: No rash.  Neurological:     General: No focal deficit present.     Mental Status: He is alert and oriented to person, place, and time.     Cranial Nerves: Cranial nerves are intact.      Sensory: Sensation is intact.     Motor: Motor function is intact.     Deep Tendon Reflexes:     Reflex Scores:      Bicep reflexes are 1+ on the right side and 1+ on the left side.    Comments: Negative Tinel's and Phalen's  Psychiatric:        Behavior: Behavior normal.        Thought Content: Thought content normal.     Wt Readings from Last 3 Encounters:  07/13/19 223 lb (101.2 kg)  04/20/19 212 lb (96.2 kg)  02/22/19 217 lb 6.4 oz (98.6 kg)    BP 118/78   Pulse 70   Ht 6\' 1"  (1.854 m)   Wt 223 lb (101.2 kg)   SpO2 97%   BMI 29.42  kg/m   Assessment and Plan: 1. Type II diabetes mellitus with complication (HCC) Clinically stable by exam and report without s/s of hypoglycemia. DM complicated by lipids. Tolerating medications glipizide well without side effects or other concerns.  Stopped metformin without permission and without side effects. Will likely need to resume when labs return. - Comprehensive metabolic panel - Hemoglobin A1c  2. Hyperlipidemia associated with type 2 diabetes mellitus (HCC) Tolerating statin medication without side effects at this time LDL is at goal of < 70 on current dose Continue same therapy without change at this time.  3. Current moderate episode of major depressive disorder without prior episode (HCC) Clinically doing well on Celexa No side effects, SI/HI  4. Right arm numbness May be DDD cervical spine with intermittent nerve impingement Begin Tylenol 500 mg bid for shoulder and hand pain - DG Cervical Spine Complete; Future   Partially dictated using Animal nutritionist. Any errors are unintentional.  Bari Edward, MD [facility]  07/13/2019

## 2019-07-14 LAB — COMPREHENSIVE METABOLIC PANEL
ALT: 27 IU/L (ref 0–44)
AST: 17 IU/L (ref 0–40)
Albumin/Globulin Ratio: 2.1 (ref 1.2–2.2)
Albumin: 4.4 g/dL (ref 4.0–5.0)
Alkaline Phosphatase: 71 IU/L (ref 39–117)
BUN/Creatinine Ratio: 16 (ref 9–20)
BUN: 12 mg/dL (ref 6–24)
Bilirubin Total: 1.5 mg/dL — ABNORMAL HIGH (ref 0.0–1.2)
CO2: 20 mmol/L (ref 20–29)
Calcium: 9.5 mg/dL (ref 8.7–10.2)
Chloride: 102 mmol/L (ref 96–106)
Creatinine, Ser: 0.75 mg/dL — ABNORMAL LOW (ref 0.76–1.27)
GFR calc Af Amer: 124 mL/min/{1.73_m2} (ref >59)
GFR calc non Af Amer: 107 mL/min/{1.73_m2} (ref >59)
Globulin, Total: 2.1 g/dL (ref 1.5–4.5)
Glucose: 141 mg/dL — ABNORMAL HIGH (ref 65–99)
Potassium: 4.5 mmol/L (ref 3.5–5.2)
Sodium: 140 mmol/L (ref 134–144)
Total Protein: 6.5 g/dL (ref 6.0–8.5)

## 2019-07-14 LAB — HEMOGLOBIN A1C
Est. average glucose Bld gHb Est-mCnc: 143 mg/dL
Hgb A1c MFr Bld: 6.6 % — ABNORMAL HIGH (ref 4.8–5.6)

## 2019-07-17 ENCOUNTER — Telehealth: Payer: Self-pay

## 2019-07-17 NOTE — Telephone Encounter (Signed)
Pt informed of XR. Wants to discuss getting MRI at his next visit after the new year for insurance purposes.  ( see note from Dr Army Melia for [facility] )

## 2019-09-28 ENCOUNTER — Encounter: Payer: BLUE CROSS/BLUE SHIELD | Admitting: Internal Medicine

## 2019-10-19 ENCOUNTER — Other Ambulatory Visit: Payer: Self-pay | Admitting: Internal Medicine

## 2019-10-19 DIAGNOSIS — E1169 Type 2 diabetes mellitus with other specified complication: Secondary | ICD-10-CM

## 2019-11-07 ENCOUNTER — Encounter: Payer: BC Managed Care – PPO | Admitting: Internal Medicine

## 2019-11-07 NOTE — Progress Notes (Deleted)
Date:  11/07/2019   Name:  Karl Barrett   DOB:  11-28-1970   MRN:  [id number]   Chief Complaint: No chief complaint on file. Karl Barrett is a 49 y.o. male who presents today for his Complete Annual Exam. He feels {DESC; WELL/FAIRLY WELL/POORLY:18703}. He reports exercising ***. He reports he is sleeping {DESC; WELL/FAIRLY WELL/POORLY:18703}.   Colonoscopy 2019 Immunization History  Administered Date(s) Administered  . Influenza,inj,Quad PF,6+ Mos 09/16/2017, 09/26/2018, 04/20/2019  . Pneumococcal Polysaccharide-23 09/26/2018  . Tdap 05/03/2017    Diabetes He presents for his follow-up diabetic visit. He has type 2 diabetes mellitus. His disease course has been stable. Current diabetic treatment includes oral agent (dual therapy) (metformin and glimepiride). An ACE inhibitor/angiotensin II receptor blocker is not being taken.  Hyperlipidemia The problem is controlled. Current antihyperlipidemic treatment includes statins. The current treatment provides significant improvement of lipids.  Depression        This is a chronic problem.  The problem has been resolved since onset.  Past treatments include SSRIs - Selective serotonin reuptake inhibitors. Gastroesophageal Reflux He complains of heartburn. This is a chronic problem. Risk factors include Barrett's esophagus. He has tried a PPI for the symptoms. The treatment provided significant relief.    Lab Results  Component Value Date   CREATININE 0.75 (L) 07/13/2019   BUN 12 07/13/2019   NA 140 07/13/2019   K 4.5 07/13/2019   CL 102 07/13/2019   CO2 20 07/13/2019   Lab Results  Component Value Date   CHOL 136 09/26/2018   HDL 52 09/26/2018   LDLCALC 67 09/26/2018   TRIG 87 09/26/2018   CHOLHDL 2.6 09/26/2018   Lab Results  Component Value Date   TSH 2.910 12/08/2016   Lab Results  Component Value Date   HGBA1C 6.6 (H) 07/13/2019   Lab Results  Component Value Date   WBC 7.8 02/22/2019   HGB 18.0 (H)  02/22/2019   HCT 52.1 (H) 02/22/2019   MCV 96 02/22/2019   PLT 154 02/22/2019   Lab Results  Component Value Date   ALT 27 07/13/2019   AST 17 07/13/2019   ALKPHOS 71 07/13/2019   BILITOT 1.5 (H) 07/13/2019     Review of Systems  Gastrointestinal: Positive for heartburn.  Psychiatric/Behavioral: Positive for depression.    Patient Active Problem List   Diagnosis Date Noted  . Current moderate episode of major depressive disorder without prior episode (HCC) 07/13/2019  . Hyperlipidemia associated with type 2 diabetes mellitus (HCC) 09/16/2017  . Arthritis of hand 09/16/2017  . History of kidney stones 12/08/2016  . Tobacco use 12/04/2015  . Chronic infection of sinus 12/04/2015  . Plantar fasciitis 12/04/2015  . Barrett's esophagus with dysplasia 02/19/2015  . Carpal tunnel syndrome 02/19/2015  . Chronic peptic ulcer 02/19/2015  . Type II diabetes mellitus with complication (HCC) 02/19/2015  . Arthritis of knee, degenerative 02/19/2015  . Recurrent genital herpes 02/19/2015    No Active Allergies  Past Surgical History:  Procedure Laterality Date  . COLONOSCOPY  2008  . COLONOSCOPY  02/02/2018   Tubular adenoma  . ESOPHAGOGASTRODUODENOSCOPY  2010  . ESOPHAGOGASTRODUODENOSCOPY  02/02/2018   no dysplasia, no Barrett  . LITHOTRIPSY      Social History   Tobacco Use  . Smoking status: Current Every Day Smoker    Packs/day: 1.00    Years: 24.00    Pack years: 24.00    Types: Cigarettes  . Smokeless tobacco: Never Used  .  Tobacco comment: going to try chantix  Substance Use Topics  . Alcohol use: Yes    Alcohol/week: 0.0 standard drinks  . Drug use: No     Medication list has been reviewed and updated.  No outpatient medications have been marked as taking for the 11/07/19 encounter (Appointment) with Glean Hess, MD.    [facility] 2/9 Scores 07/13/2019 04/20/2019 02/22/2019 09/26/2018  PHQ - 2 Score 0 2 5 1   PHQ- 9 Score 0 6 15 -    BP Readings from Last  3 Encounters:  07/13/19 118/78  04/20/19 128/78  02/22/19 136/70    Physical Exam  Wt Readings from Last 3 Encounters:  07/13/19 223 lb (101.2 kg)  04/20/19 212 lb (96.2 kg)  02/22/19 217 lb 6.4 oz (98.6 kg)    There were no vitals taken for this visit.  Assessment and Plan:

## 2019-11-18 ENCOUNTER — Other Ambulatory Visit: Payer: Self-pay | Admitting: Internal Medicine

## 2019-12-13 ENCOUNTER — Other Ambulatory Visit: Payer: Self-pay | Admitting: Internal Medicine

## 2019-12-13 NOTE — Telephone Encounter (Signed)
Requested Prescriptions  Pending Prescriptions Disp Refills  . valACYclovir (VALTREX) 1000 MG tablet [Pharmacy Med Name: VALACYCLOVIR HCL 1 GRAM TABLET] 90 tablet 2    Sig: TAKE 1 TABLET BY MOUTH EVERY DAY     Antimicrobials:  Antiviral Agents - Anti-Herpetic Passed - 12/13/2019  3:21 AM      Passed - Valid encounter within last 12 months    Recent Outpatient Visits          5 months ago Type II diabetes mellitus with complication [facility])   [facility] Reubin Milan, MD   7 months ago Need for influenza vaccination   [facility] Reubin Milan, MD   9 months ago Tendonitis of elbow, right   [facility] Reubin Milan, MD   1 year ago Annual physical exam   [facility] Reubin Milan, MD   1 year ago Uncontrolled type 2 diabetes mellitus without complication, without long-term current use of insulin [facility])   [facility] Reubin Milan, MD

## 2019-12-16 ENCOUNTER — Other Ambulatory Visit: Payer: Self-pay | Admitting: Internal Medicine

## 2019-12-16 DIAGNOSIS — F321 Major depressive disorder, single episode, moderate: Secondary | ICD-10-CM

## 2020-05-01 ENCOUNTER — Telehealth: Payer: Self-pay | Admitting: Internal Medicine

## 2020-05-01 NOTE — Telephone Encounter (Signed)
Copied from CRM [id number]. Topic: Appointment Scheduling - Scheduling Inquiry for Clinic >> May 01, 2020  8:43 AM Karl Barrett wrote: Reason for CRM: pt exposed to covid last week through his boss.  Since last Thursday pt has had diarrhea.  He called to ask if Dr Karl Barrett will see him. No fever, just diarrhea.

## 2020-07-08 ENCOUNTER — Other Ambulatory Visit: Payer: Self-pay | Admitting: Internal Medicine

## 2020-07-08 NOTE — Telephone Encounter (Signed)
Requested medication (s) are due for refill today: expired medication  Requested medication (s) are on the active medication list: yes  Last refill:  04/27/2019 #30 5 refills   Future visit scheduled: no   Notes to clinic:  expired medication last lab 07/13/2019 . Called patient to schedule appt. No answer, left message to call clinic back to schedule appt. Do you want to renew Rx?    Requested Prescriptions  Pending Prescriptions Disp Refills   glimepiride (AMARYL) 2 MG tablet [Pharmacy Med Name: GLIMEPIRIDE 2 MG TABLET] 30 tablet 6    Sig: TAKE 1 TABLET BY MOUTH EVERY DAY BEFORE BREAKFAST      Endocrinology:  Diabetes - Sulfonylureas Failed - 07/08/2020  6:27 PM      Failed - HBA1C is between 0 and 7.9 and within 180 days    Hgb A1c MFr Bld  Date Value Ref Range Status  07/13/2019 6.6 (H) 4.8 - 5.6 % Final    Comment:             Prediabetes: 5.7 - 6.4          Diabetes: >6.4          Glycemic control for adults with diabetes: <7.0           Failed - Valid encounter within last 6 months    Recent Outpatient Visits           12 months ago Type II diabetes mellitus with complication [facility])   [facility] Reubin Milan, MD   1 year ago Need for influenza vaccination   [facility] Reubin Milan, MD   1 year ago Tendonitis of elbow, right   [facility] Reubin Milan, MD   1 year ago Annual physical exam   [facility] Reubin Milan, MD   2 years ago Uncontrolled type 2 diabetes mellitus without complication, without long-term current use of insulin [facility])   [facility] Reubin Milan, MD

## 2020-07-08 NOTE — Telephone Encounter (Signed)
Requested medication (s) are due for refill today: expired medication  Requested medication (s) are on the active medication list: yes  Last refill:  04/27/2019 #30 5 refills   Future visit scheduled: no  Notes to clinic:  expired medication. Last lab 07/13/2019 called patient to schedule appt. No answer, left message to call back to clinic. Do you want to renew Rx?      Requested Prescriptions  Pending Prescriptions Disp Refills   glimepiride (AMARYL) 2 MG tablet [Pharmacy Med Name: GLIMEPIRIDE 2 MG TABLET] 30 tablet 6    Sig: TAKE 1 TABLET BY MOUTH EVERY DAY BEFORE BREAKFAST      Endocrinology:  Diabetes - Sulfonylureas Failed - 07/08/2020  6:27 PM      Failed - HBA1C is between 0 and 7.9 and within 180 days    Hgb A1c MFr Bld  Date Value Ref Range Status  07/13/2019 6.6 (H) 4.8 - 5.6 % Final    Comment:             Prediabetes: 5.7 - 6.4          Diabetes: >6.4          Glycemic control for adults with diabetes: <7.0           Failed - Valid encounter within last 6 months    Recent Outpatient Visits           12 months ago Type II diabetes mellitus with complication [facility])   [facility] Reubin Milan, MD   1 year ago Need for influenza vaccination   [facility] Reubin Milan, MD   1 year ago Tendonitis of elbow, right   [facility] Reubin Milan, MD   1 year ago Annual physical exam   [facility] Reubin Milan, MD   2 years ago Uncontrolled type 2 diabetes mellitus without complication, without long-term current use of insulin [facility])   [facility] Reubin Milan, MD

## 2020-07-29 ENCOUNTER — Emergency Department: Payer: BC Managed Care – PPO

## 2020-07-29 ENCOUNTER — Encounter: Payer: Self-pay | Admitting: Radiology

## 2020-07-29 ENCOUNTER — Other Ambulatory Visit: Payer: Self-pay

## 2020-07-29 DIAGNOSIS — Z5321 Procedure and treatment not carried out due to patient leaving prior to being seen by health care provider: Secondary | ICD-10-CM | POA: Insufficient documentation

## 2020-07-29 DIAGNOSIS — M25571 Pain in right ankle and joints of right foot: Secondary | ICD-10-CM | POA: Insufficient documentation

## 2020-07-29 NOTE — ED Triage Notes (Signed)
Pt In with co right ankle pain since yesterday when fridge fell on his right foot.

## 2020-07-30 ENCOUNTER — Emergency Department
Admission: EM | Admit: 2020-07-30 | Discharge: 2020-07-30 | Disposition: A | Discharge status: Home / Self Care | Payer: BC Managed Care – PPO | Attending: Emergency Medicine | Admitting: Emergency Medicine

## 2020-07-30 ENCOUNTER — Other Ambulatory Visit: Payer: Self-pay | Admitting: Internal Medicine

## 2020-07-30 DIAGNOSIS — F321 Major depressive disorder, single episode, moderate: Secondary | ICD-10-CM

## 2020-08-03 ENCOUNTER — Other Ambulatory Visit: Payer: Self-pay | Admitting: Internal Medicine

## 2020-08-05 ENCOUNTER — Telehealth: Payer: Self-pay

## 2020-08-05 NOTE — Telephone Encounter (Signed)
Spoke to pt told him to go to [facility]. Pt verbalized understanding.  KP

## 2020-08-05 NOTE — Telephone Encounter (Signed)
Patient is having cough, night sweats, he's not sure if he's had a fever, congestion, he has not been vaccinated, he's also is experiencing some discomfort near his private area and he could see where he's has a lump that is getting bigger.  Please advise

## 2021-05-30 ENCOUNTER — Encounter: Payer: Self-pay | Admitting: Internal Medicine

## 2021-05-30 ENCOUNTER — Ambulatory Visit
Admission: RE | Admit: 2021-05-30 | Discharge: 2021-05-30 | Disposition: A | Discharge status: Home / Self Care | Payer: BC Managed Care – PPO | Attending: Internal Medicine | Admitting: Internal Medicine

## 2021-05-30 ENCOUNTER — Other Ambulatory Visit: Payer: Self-pay

## 2021-05-30 ENCOUNTER — Ambulatory Visit: Payer: BC Managed Care – PPO | Admitting: Internal Medicine

## 2021-05-30 ENCOUNTER — Ambulatory Visit
Admission: RE | Admit: 2021-05-30 | Discharge: 2021-05-30 | Disposition: A | Discharge status: Home / Self Care | Payer: BC Managed Care – PPO | Source: Ambulatory Visit | Attending: Internal Medicine | Admitting: Internal Medicine

## 2021-05-30 VITALS — BP 134/70 | HR 76 | Ht 73.0 in | Wt 210.2 lb

## 2021-05-30 DIAGNOSIS — E785 Hyperlipidemia, unspecified: Secondary | ICD-10-CM | POA: Diagnosis not present

## 2021-05-30 DIAGNOSIS — E118 Type 2 diabetes mellitus with unspecified complications: Secondary | ICD-10-CM | POA: Diagnosis not present

## 2021-05-30 DIAGNOSIS — E1169 Type 2 diabetes mellitus with other specified complication: Secondary | ICD-10-CM

## 2021-05-30 DIAGNOSIS — K22719 Barrett's esophagus with dysplasia, unspecified: Secondary | ICD-10-CM

## 2021-05-30 DIAGNOSIS — Z1159 Encounter for screening for other viral diseases: Secondary | ICD-10-CM

## 2021-05-30 DIAGNOSIS — F321 Major depressive disorder, single episode, moderate: Secondary | ICD-10-CM

## 2021-05-30 DIAGNOSIS — M25562 Pain in left knee: Secondary | ICD-10-CM | POA: Diagnosis not present

## 2021-05-30 DIAGNOSIS — Z23 Encounter for immunization: Secondary | ICD-10-CM

## 2021-05-30 LAB — POCT UA - MICROALBUMIN: Microalbumin Ur, POC: 20 mg/L

## 2021-05-30 MED ORDER — GLIMEPIRIDE 2 MG PO TABS: ORAL_TABLET | ORAL | 0 refills | Status: DC

## 2021-05-30 MED ORDER — ATORVASTATIN CALCIUM 10 MG PO TABS: 10.0000 mg | ORAL_TABLET | Freq: Every day | ORAL | 0 refills | Status: DC

## 2021-05-30 MED ORDER — ESCITALOPRAM OXALATE 10 MG PO TABS: 10.0000 mg | ORAL_TABLET | Freq: Every day | ORAL | 0 refills | Status: DC

## 2021-05-30 MED ORDER — METFORMIN HCL ER 500 MG PO TB24: 1000.0000 mg | ORAL_TABLET | Freq: Every morning | ORAL | 0 refills | Status: DC

## 2021-05-30 MED ORDER — PREDNISONE 10 MG PO TABS
10.0000 mg | ORAL_TABLET | ORAL | 0 refills | Status: AC
Start: 2021-05-30 — End: 2021-06-05

## 2021-05-30 NOTE — Progress Notes (Signed)
Date:  05/30/2021   Name:  Karl Barrett   DOB:  13-Jun-1971   MRN:  [id number]   Chief Complaint: Diabetes (Foot Exam, A1C, CMP, Flu Shot), Hyperlipidemia (LIPIDs Check), and Knee Pain (Worsening in the last 4 days. Unsure of injury. Gets down on his knees and back up again often in his line of work. May have injured it through his job, but unsure. Difficulty bearing weight, and limping today.)  Diabetes He presents for his follow-up diabetic visit. He has type 2 diabetes mellitus. His disease course has been fluctuating. Hypoglycemia symptoms include nervousness/anxiousness. Pertinent negatives for hypoglycemia include no headaches or tremors. There are no diabetic associated symptoms. Pertinent negatives for diabetes include no chest pain, no fatigue, no polydipsia and no polyuria. Risk factors for coronary artery disease include male sex, tobacco exposure, stress and diabetes mellitus. When asked about current treatments, none were reported. He is compliant with treatment none of the time. He does not see a podiatrist.Eye exam is not current.  Hyperlipidemia This is a chronic problem. The current episode started more than 1 year ago. The problem is uncontrolled. Exacerbating diseases include diabetes. Factors aggravating his hyperlipidemia include smoking. Pertinent negatives include no chest pain or shortness of breath. Risk factors for coronary artery disease include male sex, diabetes mellitus and stress.  Knee Pain  The incident occurred more than 1 week ago. The injury mechanism is unknown. The pain is present in the left knee. The pain is at a severity of 7/10. The pain is moderate. The pain has been Constant since onset. Associated symptoms include an inability to bear weight and a loss of motion. Pertinent negatives include no numbness. He reports no foreign bodies present. The symptoms are aggravated by movement, weight bearing and palpation.   Lab Results  Component Value Date    CREATININE 0.75 (L) 07/13/2019   BUN 12 07/13/2019   NA 140 07/13/2019   K 4.5 07/13/2019   CL 102 07/13/2019   CO2 20 07/13/2019   Lab Results  Component Value Date   CHOL 136 09/26/2018   HDL 52 09/26/2018   LDLCALC 67 09/26/2018   TRIG 87 09/26/2018   CHOLHDL 2.6 09/26/2018   Lab Results  Component Value Date   TSH 2.910 12/08/2016   Lab Results  Component Value Date   HGBA1C 6.6 (H) 07/13/2019   Lab Results  Component Value Date   WBC 7.8 02/22/2019   HGB 18.0 (H) 02/22/2019   HCT 52.1 (H) 02/22/2019   MCV 96 02/22/2019   PLT 154 02/22/2019   Lab Results  Component Value Date   ALT 27 07/13/2019   AST 17 07/13/2019   ALKPHOS 71 07/13/2019   BILITOT 1.5 (H) 07/13/2019     Review of Systems  Constitutional:  Positive for unexpected weight change (20 lbs over the past year). Negative for appetite change and fatigue.  HENT:  Negative for trouble swallowing.   Eyes:  Positive for visual disturbance.  Respiratory:  Negative for cough, shortness of breath and wheezing.   Cardiovascular:  Negative for chest pain, palpitations and leg swelling.  Gastrointestinal:  Positive for abdominal pain and anal bleeding. Negative for blood in stool, constipation and diarrhea.  Endocrine: Negative for polydipsia and polyuria.  Genitourinary:  Negative for dysuria and hematuria.  Musculoskeletal:  Positive for arthralgias and joint swelling.  Skin:  Negative for color change and rash.  Neurological:  Negative for tremors, numbness and headaches.  Psychiatric/Behavioral:  Positive for  dysphoric mood. Negative for sleep disturbance and suicidal ideas. The patient is nervous/anxious.    Patient Active Problem List   Diagnosis Date Noted   Current moderate episode of major depressive disorder without prior episode (HCC) 07/13/2019   Hyperlipidemia associated with type 2 diabetes mellitus (HCC) 09/16/2017   Arthritis of hand 09/16/2017   History of kidney stones 12/08/2016    Tobacco use 12/04/2015   Plantar fasciitis 12/04/2015   Barrett's esophagus with dysplasia 02/19/2015   Carpal tunnel syndrome 02/19/2015   Chronic peptic ulcer 02/19/2015   Type II diabetes mellitus with complication (HCC) 02/19/2015   Arthritis of knee, degenerative 02/19/2015   Recurrent genital herpes 02/19/2015    No Active Allergies  Past Surgical History:  Procedure Laterality Date   COLONOSCOPY  2008   COLONOSCOPY  02/02/2018   Tubular adenoma   ESOPHAGOGASTRODUODENOSCOPY  2010   ESOPHAGOGASTRODUODENOSCOPY  02/02/2018   no dysplasia, no Barrett   LITHOTRIPSY      Social History   Tobacco Use   Smoking status: Every Day    Packs/day: 1.25    Years: 28.00    Pack years: 35.00    Types: Cigarettes   Smokeless tobacco: Never  Vaping Use   Vaping Use: Some days  Substance Use Topics   Alcohol use: Yes    Alcohol/week: 0.0 standard drinks   Drug use: No     Medication list has been reviewed and updated.  No outpatient medications have been marked as taking for the 05/30/21 encounter (Office Visit) with Reubin Milan, MD.    [facility] 2/9 Scores 05/30/2021 07/13/2019 04/20/2019 02/22/2019  PHQ - 2 Score 2 0 2 5  PHQ- 9 Score 9 0 6 15    GAD 7 : Generalized Anxiety Score 05/30/2021 02/22/2019  Nervous, Anxious, on Edge 1 2  Control/stop worrying 1 1  Worry too much - different things 1 3  Trouble relaxing 1 2  Restless 1 2  Easily annoyed or irritable 1 3  Afraid - awful might happen 0 1  Total GAD 7 Score 6 14  Anxiety Difficulty Not difficult at all Very difficult    BP Readings from Last 3 Encounters:  05/30/21 134/70  07/30/20 126/81  07/13/19 118/78    Physical Exam Vitals and nursing note reviewed.  Constitutional:      General: He is not in acute distress.    Appearance: Normal appearance. He is well-developed.  HENT:     Head: Normocephalic and atraumatic.  Neck:     Vascular: No carotid bruit.  Cardiovascular:     Rate and Rhythm: Normal  rate and regular rhythm.     Pulses: Normal pulses.     Heart sounds: No murmur heard. Pulmonary:     Effort: Pulmonary effort is normal. No respiratory distress.     Breath sounds: No wheezing or rhonchi.  Musculoskeletal:     Cervical back: Normal range of motion.     Right knee: Normal.     Left knee: Swelling and effusion present. Decreased range of motion. Tenderness present over the medial joint line.     Right lower leg: No edema.     Left lower leg: No edema.  Lymphadenopathy:     Cervical: No cervical adenopathy.  Skin:    General: Skin is warm and dry.     Findings: No rash.  Neurological:     Mental Status: He is alert and oriented to person, place, and time.  Psychiatric:  Mood and Affect: Mood normal.        Behavior: Behavior normal.    Wt Readings from Last 3 Encounters:  05/30/21 210 lb 3.2 oz (95.3 kg)  07/29/20 230 lb (104.3 kg)  07/13/19 223 lb (101.2 kg)    BP 134/70   Pulse 76   Ht 6\' 1"  (1.854 m)   Wt 210 lb 3.2 oz (95.3 kg)   SpO2 98%   BMI 27.73 kg/m   Assessment and Plan: 1. Type II diabetes mellitus with complication (HCC) He has been out of meds for at least year due to insurance issues Will resume metformin and amaryl Recommend delaying eye exam until BS improved Follow up in 3 months - POCT UA - Microalbumin - Hemoglobin A1c - Comprehensive metabolic panel - CBC with Differential/Platelet - glimepiride (AMARYL) 2 MG tablet; TAKE 1 TABLET BY MOUTH EVERY DAY BEFORE BREAKFAST  Dispense: 90 tablet; Refill: 0 - metFORMIN (GLUCOPHAGE-XR) 500 MG 24 hr tablet; Take 2 tablets (1,000 mg total) by mouth every morning.  Dispense: 270 tablet; Refill: 0  2. Hyperlipidemia associated with type 2 diabetes mellitus (HCC) Resume statin medication. - Lipid panel - atorvastatin (LIPITOR) 10 MG tablet; Take 1 tablet (10 mg total) by mouth daily.  Dispense: 90 tablet; Refill: 0  3. Current moderate episode of major depressive disorder without prior  episode (HCC) Moderate symptoms currently - he has been out of medication for about 10 months Exacerbated by separation, stressful work environment, physical complaints Will resume lexapro which worked well in the past - escitalopram (LEXAPRO) 10 MG tablet; Take 1 tablet (10 mg total) by mouth daily.  Dispense: 90 tablet; Refill: 0  4. Acute pain of left knee Possible gout but more likely acute inflammation on some chronic OA Will get imaging and start steroid taper; consider Ortho vs SM referral - DG Knee Complete 4 Views Left - predniSONE (DELTASONE) 10 MG tablet; Take 1 tablet (10 mg total) by mouth as directed for 6 days. Take 6,5,4,3,2,1 then stop  Dispense: 21 tablet; Refill: 0  5. Need for hepatitis C screening test - Hepatitis C antibody  6. Need for immunization against influenza - Flu Vaccine QUAD 68mo+IM (Fluarix, Fluzone & Alfiuria Quad PF)  7. Barrett's esophagus with dysplasia Continue over the counter Nexium or Prilosec   Partially dictated using 5mo. Any errors are unintentional.  Animal nutritionist, MD [facility]  05/30/2021

## 2021-05-31 LAB — CBC WITH DIFFERENTIAL/PLATELET
Basophils Absolute: 0.1 10*3/uL (ref 0.0–0.2)
Basos: 1 %
EOS (ABSOLUTE): 0.3 10*3/uL (ref 0.0–0.4)
Eos: 4 %
Hematocrit: 52.4 % — ABNORMAL HIGH (ref 37.5–51.0)
Hemoglobin: 18.1 g/dL — ABNORMAL HIGH (ref 13.0–17.7)
Immature Grans (Abs): 0 10*3/uL (ref 0.0–0.1)
Immature Granulocytes: 0 %
Lymphocytes Absolute: 1.6 10*3/uL (ref 0.7–3.1)
Lymphs: 21 %
MCH: 32.5 pg (ref 26.6–33.0)
MCHC: 34.5 g/dL (ref 31.5–35.7)
MCV: 94 fL (ref 79–97)
Monocytes Absolute: 0.7 10*3/uL (ref 0.1–0.9)
Monocytes: 10 %
Neutrophils Absolute: 5.1 10*3/uL (ref 1.4–7.0)
Neutrophils: 64 %
Platelets: 201 10*3/uL (ref 150–450)
RBC: 5.57 x10E6/uL (ref 4.14–5.80)
RDW: 11.5 % — ABNORMAL LOW (ref 11.6–15.4)
WBC: 7.8 10*3/uL (ref 3.4–10.8)

## 2021-05-31 LAB — COMPREHENSIVE METABOLIC PANEL
ALT: 19 IU/L (ref 0–44)
AST: 16 IU/L (ref 0–40)
Albumin/Globulin Ratio: 2.1 (ref 1.2–2.2)
Albumin: 4.5 g/dL (ref 3.8–4.9)
Alkaline Phosphatase: 79 IU/L (ref 44–121)
BUN/Creatinine Ratio: 13 (ref 9–20)
BUN: 9 mg/dL (ref 6–24)
Bilirubin Total: 0.8 mg/dL (ref 0.0–1.2)
CO2: 23 mmol/L (ref 20–29)
Calcium: 9.9 mg/dL (ref 8.7–10.2)
Chloride: 101 mmol/L (ref 96–106)
Creatinine, Ser: 0.71 mg/dL — ABNORMAL LOW (ref 0.76–1.27)
Globulin, Total: 2.1 g/dL (ref 1.5–4.5)
Glucose: 182 mg/dL — ABNORMAL HIGH (ref 70–99)
Potassium: 4.5 mmol/L (ref 3.5–5.2)
Sodium: 140 mmol/L (ref 134–144)
Total Protein: 6.6 g/dL (ref 6.0–8.5)
eGFR: 110 mL/min/{1.73_m2} (ref >59)

## 2021-05-31 LAB — LIPID PANEL
Chol/HDL Ratio: 3.4 ratio (ref 0.0–5.0)
Cholesterol, Total: 153 mg/dL (ref 100–199)
HDL: 45 mg/dL (ref >39)
LDL Chol Calc (NIH): 94 mg/dL (ref 0–99)
Triglycerides: 72 mg/dL (ref 0–149)
VLDL Cholesterol Cal: 14 mg/dL (ref 5–40)

## 2021-05-31 LAB — HEPATITIS C ANTIBODY: Hep C Virus Ab: <0.1 s/co ratio (ref 0.0–0.9)

## 2021-05-31 LAB — HEMOGLOBIN A1C
Est. average glucose Bld gHb Est-mCnc: 177 mg/dL
Hgb A1c MFr Bld: 7.8 % — ABNORMAL HIGH (ref 4.8–5.6)

## 2021-06-02 NOTE — Progress Notes (Signed)
Informed pt with labs. Pt verbalized understanding.  KP 

## 2021-06-05 ENCOUNTER — Ambulatory Visit: Payer: BC Managed Care – PPO | Admitting: Internal Medicine

## 2021-06-05 ENCOUNTER — Emergency Department
Admission: EM | Admit: 2021-06-05 | Discharge: 2021-06-05 | Disposition: A | Discharge status: Home / Self Care | Payer: BC Managed Care – PPO | Attending: Emergency Medicine | Admitting: Emergency Medicine

## 2021-06-05 ENCOUNTER — Encounter: Payer: Self-pay | Admitting: Emergency Medicine

## 2021-06-05 ENCOUNTER — Telehealth: Payer: Self-pay

## 2021-06-05 ENCOUNTER — Ambulatory Visit: Payer: Self-pay | Admitting: *Deleted

## 2021-06-05 ENCOUNTER — Other Ambulatory Visit: Payer: Self-pay

## 2021-06-05 ENCOUNTER — Emergency Department: Payer: BC Managed Care – PPO

## 2021-06-05 DIAGNOSIS — R531 Weakness: Secondary | ICD-10-CM | POA: Diagnosis not present

## 2021-06-05 DIAGNOSIS — Z79899 Other long term (current) drug therapy: Secondary | ICD-10-CM | POA: Insufficient documentation

## 2021-06-05 DIAGNOSIS — J3489 Other specified disorders of nose and nasal sinuses: Secondary | ICD-10-CM | POA: Diagnosis not present

## 2021-06-05 DIAGNOSIS — F17210 Nicotine dependence, cigarettes, uncomplicated: Secondary | ICD-10-CM | POA: Insufficient documentation

## 2021-06-05 DIAGNOSIS — I10 Essential (primary) hypertension: Secondary | ICD-10-CM | POA: Insufficient documentation

## 2021-06-05 DIAGNOSIS — E119 Type 2 diabetes mellitus without complications: Secondary | ICD-10-CM | POA: Insufficient documentation

## 2021-06-05 DIAGNOSIS — Z8673 Personal history of transient ischemic attack (TIA), and cerebral infarction without residual deficits: Secondary | ICD-10-CM | POA: Diagnosis not present

## 2021-06-05 DIAGNOSIS — Z7984 Long term (current) use of oral hypoglycemic drugs: Secondary | ICD-10-CM | POA: Diagnosis not present

## 2021-06-05 LAB — URINALYSIS, COMPLETE (UACMP) WITH MICROSCOPIC
Bacteria, UA: NONE SEEN
Bilirubin Urine: NEGATIVE
Glucose, UA: >=500 mg/dL — AB
Ketones, ur: NEGATIVE mg/dL
Leukocytes,Ua: NEGATIVE
Nitrite: NEGATIVE
Protein, ur: NEGATIVE mg/dL
Specific Gravity, Urine: 1.027 (ref 1.005–1.030)
Squamous Epithelial / HPF: NONE SEEN (ref 0–5)
pH: 5 (ref 5.0–8.0)

## 2021-06-05 LAB — BASIC METABOLIC PANEL
Anion gap: 8 (ref 5–15)
BUN: 13 mg/dL (ref 6–20)
CO2: 25 mmol/L (ref 22–32)
Calcium: 9.2 mg/dL (ref 8.9–10.3)
Chloride: 100 mmol/L (ref 98–111)
Creatinine, Ser: 0.78 mg/dL (ref 0.61–1.24)
GFR, Estimated: >60 mL/min (ref >60)
Glucose, Bld: 264 mg/dL — ABNORMAL HIGH (ref 70–99)
Potassium: 3.8 mmol/L (ref 3.5–5.1)
Sodium: 133 mmol/L — ABNORMAL LOW (ref 135–145)

## 2021-06-05 LAB — CBC
HCT: 52.8 % — ABNORMAL HIGH (ref 39.0–52.0)
Hemoglobin: 18 g/dL — ABNORMAL HIGH (ref 13.0–17.0)
MCH: 32.3 pg (ref 26.0–34.0)
MCHC: 34.1 g/dL (ref 30.0–36.0)
MCV: 94.8 fL (ref 80.0–100.0)
Platelets: 183 10*3/uL (ref 150–400)
RBC: 5.57 MIL/uL (ref 4.22–5.81)
RDW: 11.8 % (ref 11.5–15.5)
WBC: 12.6 10*3/uL — ABNORMAL HIGH (ref 4.0–10.5)
nRBC: 0 % (ref 0.0–0.2)

## 2021-06-05 MED ORDER — DIAZEPAM 5 MG PO TABS
10.0000 mg | ORAL_TABLET | Freq: Once | ORAL | Status: AC
  Administered 2021-06-05: 10 mg via ORAL
  Filled 2021-06-05: qty 2

## 2021-06-05 NOTE — ED Provider Notes (Signed)
[facility] Emergency Department Provider Note  ____________________________________________  Time seen: Approximately 6:23 PM  I have reviewed the triage vital signs and the nursing notes.   HISTORY  Chief Complaint Weakness    HPI Karl Barrett is a 50 y.o. male with a past history of hyperlipidemia, diabetes, hypertension who comes ED complaining of generalized weakness today.  Yesterday he had an episode where he felt somewhat dizzy and confused and like he was having trouble speaking.  He thought his blood sugar might be low so he went to a fast food drive-through, but felt like he was having difficulty ordering.  Those symptoms resolved, he did not have any loss of consciousness or lateralizing weakness.  He notes that during that time he looked in his car mirror and did not notice any facial asymmetry.  No aggravating or alleviating factors.  Denies any pain, no headache or vision changes.  No current paresthesia or weakness.  No recent trauma      History reviewed. No pertinent past medical history.   Patient Active Problem List   Diagnosis Date Noted   Current moderate episode of major depressive disorder without prior episode (HCC) 07/13/2019   Hyperlipidemia associated with type 2 diabetes mellitus (HCC) 09/16/2017   Arthritis of hand 09/16/2017   History of kidney stones 12/08/2016   Tobacco use 12/04/2015   Plantar fasciitis 12/04/2015   Barrett's esophagus with dysplasia 02/19/2015   Carpal tunnel syndrome 02/19/2015   Chronic peptic ulcer 02/19/2015   Type II diabetes mellitus with complication (HCC) 02/19/2015   Arthritis of knee, degenerative 02/19/2015   Recurrent genital herpes 02/19/2015     Past Surgical History:  Procedure Laterality Date   COLONOSCOPY  2008   COLONOSCOPY  02/02/2018   Tubular adenoma   ESOPHAGOGASTRODUODENOSCOPY  2010   ESOPHAGOGASTRODUODENOSCOPY  02/02/2018   no dysplasia, no Barrett   LITHOTRIPSY        Prior to Admission medications   Medication Sig Start Date End Date Taking? Authorizing Provider  atorvastatin (LIPITOR) 10 MG tablet Take 1 tablet (10 mg total) by mouth daily. 05/30/21   Reubin Milan, MD  escitalopram (LEXAPRO) 10 MG tablet Take 1 tablet (10 mg total) by mouth daily. 05/30/21   Reubin Milan, MD  glimepiride (AMARYL) 2 MG tablet TAKE 1 TABLET BY MOUTH EVERY DAY BEFORE BREAKFAST 05/30/21   Reubin Milan, MD  metFORMIN (GLUCOPHAGE-XR) 500 MG 24 hr tablet Take 2 tablets (1,000 mg total) by mouth every morning. 05/30/21   Reubin Milan, MD  predniSONE (DELTASONE) 10 MG tablet Take 1 tablet (10 mg total) by mouth as directed for 6 days. Take 6,5,4,3,2,1 then stop 05/30/21 06/05/21  Reubin Milan, MD     Allergies Patient has no known allergies.   Family History  Problem Relation Age of Onset   Hypertension Mother    Heart disease Mother    Heart disease Father    Diabetes Father    Cancer Maternal Grandfather        pancreatic    Social History Social History   Tobacco Use   Smoking status: Every Day    Packs/day: 1.25    Years: 28.00    Pack years: 35.00    Types: Cigarettes   Smokeless tobacco: Never  Vaping Use   Vaping Use: Some days  Substance Use Topics   Alcohol use: Yes    Alcohol/week: 0.0 standard drinks   Drug use: No    Review  of Systems  Constitutional:   No fever or chills.  ENT:   No sore throat. No rhinorrhea. Cardiovascular:   No chest pain or syncope. Respiratory:   No dyspnea or cough. Gastrointestinal:   Negative for abdominal pain, vomiting and diarrhea.  Musculoskeletal:   Negative for focal pain or swelling All other systems reviewed and are negative except as documented above in ROS and HPI.  ____________________________________________   PHYSICAL EXAM:  VITAL SIGNS: ED Triage Vitals  Enc Vitals Group     BP 06/05/21 1008 137/80     Pulse Rate 06/05/21 1008 88     Resp 06/05/21 1008 20      Temp 06/05/21 1008 98 F (36.7 C)     Temp Source 06/05/21 1008 Oral     SpO2 06/05/21 1008 96 %     Weight 06/05/21 1001 210 lb 1.6 oz (95.3 kg)     Height 06/05/21 1001 6\' 1"  (1.854 m)     Head Circumference --      Peak Flow --      Pain Score 06/05/21 1001 0     Pain Loc --      Pain Edu? --      Excl. in GC? --     Vital signs reviewed, nursing assessments reviewed.   Constitutional:   Alert and oriented. Non-toxic appearance. Eyes:   Conjunctivae are normal. EOMI. PERRL. ENT      Head:   Normocephalic and atraumatic.      Nose:   Wearing a mask.      Mouth/Throat:   Wearing a mask.      Neck:   No meningismus. Full ROM. Hematological/Lymphatic/Immunilogical:   No cervical lymphadenopathy. Cardiovascular:   RRR. Symmetric bilateral radial and DP pulses.  No murmurs. Cap refill less than 2 seconds. Respiratory:   Normal respiratory effort without tachypnea/retractions. Breath sounds are clear and equal bilaterally. No wheezes/rales/rhonchi. Gastrointestinal:   Soft and nontender. Non distended. There is no CVA tenderness.  No rebound, rigidity, or guarding. Genitourinary:   deferred Musculoskeletal:   Normal range of motion in all extremities. No joint effusions.  No lower extremity tenderness.  No edema. Neurologic:   Normal speech and language.  Motor grossly intact. Normal gait.  Normal cerebellar function.  Normal cognitive function No acute focal neurologic deficits are appreciated.  NIH stroke scale 0 Skin:    Skin is warm, dry and intact. No rash noted.  No petechiae, purpura, or bullae.  ____________________________________________    LABS (pertinent positives/negatives) (all labs ordered are listed, but only abnormal results are displayed) Labs Reviewed  BASIC METABOLIC PANEL - Abnormal; Notable for the following components:      Result Value   Sodium 133 (*)    Glucose, Bld 264 (*)    All other components within normal limits  CBC - Abnormal; Notable for  the following components:   WBC 12.6 (*)    Hemoglobin 18.0 (*)    HCT 52.8 (*)    All other components within normal limits  URINALYSIS, COMPLETE (UACMP) WITH MICROSCOPIC - Abnormal; Notable for the following components:   Color, Urine YELLOW (*)    APPearance HAZY (*)    Glucose, UA >=500 (*)    Hgb urine dipstick MODERATE (*)    All other components within normal limits  CBG MONITORING, ED   ____________________________________________   EKG Interpreted by me  Date: 06/05/2021  Rate: 85  Rhythm: normal sinus rhythm  QRS Axis: normal  Intervals:  normal  ST/T Wave abnormalities: normal  Conduction Disutrbances: none  Narrative Interpretation: unremarkable      ____________________________________________    RADIOLOGY  MR BRAIN WO CONTRAST  Result Date: 06/05/2021 CLINICAL DATA:  Transient ischemic attack (TIA) EXAM: MRI HEAD WITHOUT CONTRAST TECHNIQUE: Multiplanar, multiecho pulse sequences of the brain and surrounding structures were obtained without intravenous contrast. COMPARISON:  None. FINDINGS: Brain: There is no acute infarction or intracranial hemorrhage. There is no intracranial mass, mass effect, or edema. There is no hydrocephalus or extra-axial fluid collection. Ventricles and sulci are within normal limits in size and configuration. Vascular: Major vessel flow voids at the skull base are preserved. Skull and upper cervical spine: Normal marrow signal is preserved. Sinuses/Orbits: Moderate paranasal sinus mucosal thickening. Orbits are unremarkable. Other: Sella is unremarkable.  Mastoid air cells are clear. IMPRESSION: No acute infarction, hemorrhage, or mass. Electronically Signed   By: Guadlupe Spanish M.D.   On: 06/05/2021 16:54    ____________________________________________   PROCEDURES Procedures  ____________________________________________  DIFFERENTIAL DIAGNOSIS   Fatigue, transient hyperglycemia, hypertension, ischemic stroke  CLINICAL  IMPRESSION / ASSESSMENT AND PLAN / ED COURSE  Medications ordered in the ED: Medications  diazepam (VALIUM) tablet 10 mg (10 mg Oral Given 06/05/21 1606)    Pertinent labs & imaging results that were available during my care of the patient were reviewed by me and considered in my medical decision making (see chart for details).  Karl Barrett was evaluated in Emergency Department on 06/05/2021 for the symptoms described in the history of present illness. He was evaluated in the context of the global COVID-19 pandemic, which necessitated consideration that the patient might be at risk for infection with the SARS-CoV-2 virus that causes COVID-19. Institutional protocols and algorithms that pertain to the evaluation of patients at risk for COVID-19 are in a state of rapid change based on information released by regulatory bodies including the CDC and federal and state organizations. These policies and algorithms were followed during the patient's care in the ED.   Patient presents after episode of altered level of consciousness yesterday which was brief and without focal neuro symptoms.  Today his exam is unremarkable.  Doubt ACS PE dissection AAA pneumothorax pericarditis intracranial hemorrhage meningitis encephalitis sepsis or shock.  With his comorbidities, I obtained an MRI which is negative for any signs of acute infarct.  At this point and he stable for discharge home given his improved symptoms and overall nonfocal presentation today.  Recommend follow-up with PCP next week.      ____________________________________________   FINAL CLINICAL IMPRESSION(S) / ED DIAGNOSES    Final diagnoses:  Generalized weakness     ED Discharge Orders     None       Portions of this note were generated with dragon dictation software. Dictation errors may occur despite best attempts at proofreading.    Sharman Cheek, MD 06/05/21 [phone]

## 2021-06-05 NOTE — ED Triage Notes (Signed)
Arrives with c/o weakness today.  Describes an episode yesterday afternoon where patient felt dizzy and was having difficulty speaking.  STates had difficulty ordering food through the drive through.  Symptoms resolved, but states feels generally weak today.  AAOx3.  Skin warm and dry.  MAE equally and strong.  Gait steady.  NAD

## 2021-06-05 NOTE — ED Notes (Signed)
Yesterday he had episode of nausea/sweating.  Says he felt like he was in a delayed reaction--and foggy all day.  Says he thought sugar was low, went to drive through and said they could not understand him because he could not talk loud enough.  He went home.  Tried to eat and vomited.  He is currently alert and no distress.  Says he just feels really tiredd today.

## 2021-06-05 NOTE — Telephone Encounter (Signed)
Pt. Called back. Given message from PCP. Verbalizes understanding. Will have someone drive him to ED now.

## 2021-06-05 NOTE — ED Notes (Signed)
Called MRI to coordinate. MRI staff state two other pt's ahead in line. State they will call a Flex RN when needed so that valium can be given on time for imaging.

## 2021-06-05 NOTE — Telephone Encounter (Signed)
Tried calling pt about appt scheduled today. He is having stroke like symptoms per Dr Judithann Graves and needs to go to the emergency room. Patient did not answer the phone and I am unable to leave a VM. Tried to call patient twice. The front desk also tried to call twice. Contacting patients daughter Abbi Meininger and informed her that her father needs to go to the ER stat. She said she will go talk to him now since they work in the same place and will tell him to go to the ER to be seen for possible stroke.

## 2021-06-05 NOTE — Discharge Instructions (Addendum)
Your lab tests and MRI of the brain today were all okay.  Please follow-up with your doctor for continued monitoring of your symptoms.

## 2021-06-05 NOTE — ED Notes (Signed)
On phone with mri now.

## 2021-06-05 NOTE — Telephone Encounter (Signed)
Patient is calling to reports: yesterday he felt dizziness and delayed reaction, nausea-sweating. Patient couldn't speak well. Patient rested at home but still had nausea. Patient states he is still weak- some better. Reason for Disposition  [1] MODERATE weakness (i.e., interferes with work, school, normal activities) AND [2] cause unknown  (Exceptions: weakness with acute minor illness, or weakness from poor fluid intake)  Answer Assessment - Initial Assessment Questions 1. DESCRIPTION: "Describe how you are feeling."     Fatigue today- neurological symptoms yesterday 2. SEVERITY: "How bad is it?"  "Can you stand and walk?"   - MILD - Feels weak or tired, but does not interfere with work, school or normal activities   - MODERATE - Able to stand and walk; weakness interferes with work, school, or normal activities   - SEVERE - Unable to stand or walk     mild 3. ONSET:  "When did the weakness begin?"     Yesterday morning 4. CAUSE: "What do you think is causing the weakness?"     Possible event yesterday 5. MEDICINES: "Have you recently started a new medicine or had a change in the amount of a medicine.      Patient is off all medications- insurance had ended- 3 months ago 6. OTHER SYMPTOMS: "Do you have any other symptoms?" (e.g., chest pain, fever, cough, SOB, vomiting, diarrhea, bleeding, other areas of pain)     No other symptoms at this time 7. PREGNANCY: "Is there any chance you are pregnant?" "When was your last menstrual period?"     N/a  Protocols used: Weakness (Generalized) and Fatigue-A-AH

## 2021-06-05 NOTE — Telephone Encounter (Addendum)
Attempted to call patient- home number not active and no answer with cell number- no VM - unable to leave message. Office has contacted daughter with instructions and patient is at ED.

## 2021-08-06 ENCOUNTER — Other Ambulatory Visit: Payer: Self-pay | Admitting: Internal Medicine

## 2021-08-06 DIAGNOSIS — E1169 Type 2 diabetes mellitus with other specified complication: Secondary | ICD-10-CM

## 2021-08-06 DIAGNOSIS — E118 Type 2 diabetes mellitus with unspecified complications: Secondary | ICD-10-CM

## 2021-08-06 DIAGNOSIS — E785 Hyperlipidemia, unspecified: Secondary | ICD-10-CM

## 2021-08-06 NOTE — Telephone Encounter (Signed)
Requested Prescriptions  Pending Prescriptions Disp Refills   atorvastatin (LIPITOR) 10 MG tablet [Pharmacy Med Name: ATORVASTATIN 10 MG TABLET] 90 tablet 0    Sig: TAKE 1 TABLET BY MOUTH EVERY DAY     Cardiovascular:  Antilipid - Statins Passed - 08/06/2021  1:44 AM      Passed - Total Cholesterol in normal range and within 360 days    Cholesterol, Total  Date Value Ref Range Status  05/30/2021 153 100 - 199 mg/dL Final         Passed - LDL in normal range and within 360 days    LDL Chol Calc (NIH)  Date Value Ref Range Status  05/30/2021 94 0 - 99 mg/dL Final         Passed - HDL in normal range and within 360 days    HDL  Date Value Ref Range Status  05/30/2021 45 >39 mg/dL Final         Passed - Triglycerides in normal range and within 360 days    Triglycerides  Date Value Ref Range Status  05/30/2021 72 0 - 149 mg/dL Final         Passed - Patient is not pregnant      Passed - Valid encounter within last 12 months    Recent Outpatient Visits          2 months ago Type II diabetes mellitus with complication [facility])   [facility] Reubin Milan, MD   2 years ago Type II diabetes mellitus with complication [facility])   [facility] Reubin Milan, MD   2 years ago Need for influenza vaccination   [facility] Reubin Milan, MD   2 years ago Tendonitis of elbow, right   [facility] Reubin Milan, MD   2 years ago Annual physical exam   [facility] Reubin Milan, MD      Future Appointments            In 3 weeks Reubin Milan, MD [facility]            glimepiride (AMARYL) 2 MG tablet [Pharmacy Med Name: GLIMEPIRIDE 2 MG TABLET] 90 tablet 0    Sig: TAKE 1 TABLET BY MOUTH EVERY DAY BEFORE BREAKFAST     Endocrinology:  Diabetes - Sulfonylureas Passed - 08/06/2021  1:44 AM      Passed - HBA1C is between 0 and 7.9 and within 180 days    Hgb A1c MFr Bld  Date Value Ref Range  Status  05/30/2021 7.8 (H) 4.8 - 5.6 % Final    Comment:             Prediabetes: 5.7 - 6.4          Diabetes: >6.4          Glycemic control for adults with diabetes: <7.0          Passed - Valid encounter within last 6 months    Recent Outpatient Visits          2 months ago Type II diabetes mellitus with complication [facility])   [facility] Reubin Milan, MD   2 years ago Type II diabetes mellitus with complication [facility])   [facility] Reubin Milan, MD   2 years ago Need for influenza vaccination   [facility] Reubin Milan, MD   2 years ago Tendonitis of elbow, right  [facility] Reubin Milan, MD   2 years ago Annual physical exam   [facility] Reubin Milan, MD      Future Appointments            In 3 weeks Judithann Graves Nyoka Cowden, MD [facility], [facility]

## 2021-09-01 ENCOUNTER — Ambulatory Visit: Payer: BC Managed Care – PPO | Admitting: Internal Medicine

## 2021-09-01 ENCOUNTER — Telehealth: Payer: Self-pay

## 2021-09-01 NOTE — Telephone Encounter (Signed)
Could not reach patient to reschedule appointment

## 2022-05-18 ENCOUNTER — Telehealth: Payer: Self-pay

## 2022-05-18 NOTE — Patient Outreach (Signed)
  Care Coordination   05/18/2022 Name: Karl Barrett MRN: [id number] DOB: 22-Mar-1971   Care Coordination Outreach Attempts:  An unsuccessful telephone outreach was attempted today to offer the patient information about available care coordination services as a benefit of their health plan.   Follow Up Plan:  Additional outreach attempts will be made to offer the patient care coordination information and services.   Encounter Outcome:  No Answer  Care Coordination Interventions Activated:  No   Care Coordination Interventions:  No, not indicated    Noreene Larsson RN, MSN, [facility]  Mobile: [phone]

## 2022-07-26 ENCOUNTER — Emergency Department: Payer: BC Managed Care – PPO

## 2022-07-26 ENCOUNTER — Encounter: Payer: Self-pay | Admitting: Emergency Medicine

## 2022-07-26 ENCOUNTER — Emergency Department
Admission: EM | Admit: 2022-07-26 | Discharge: 2022-07-26 | Disposition: A | Discharge status: Home / Self Care | Payer: BC Managed Care – PPO | Attending: Emergency Medicine | Admitting: Emergency Medicine

## 2022-07-26 ENCOUNTER — Other Ambulatory Visit: Payer: Self-pay

## 2022-07-26 DIAGNOSIS — Z1152 Encounter for screening for COVID-19: Secondary | ICD-10-CM | POA: Insufficient documentation

## 2022-07-26 DIAGNOSIS — F17210 Nicotine dependence, cigarettes, uncomplicated: Secondary | ICD-10-CM | POA: Diagnosis not present

## 2022-07-26 DIAGNOSIS — R0789 Other chest pain: Secondary | ICD-10-CM | POA: Diagnosis not present

## 2022-07-26 DIAGNOSIS — R221 Localized swelling, mass and lump, neck: Secondary | ICD-10-CM | POA: Diagnosis not present

## 2022-07-26 DIAGNOSIS — M25511 Pain in right shoulder: Secondary | ICD-10-CM | POA: Diagnosis not present

## 2022-07-26 DIAGNOSIS — E119 Type 2 diabetes mellitus without complications: Secondary | ICD-10-CM | POA: Insufficient documentation

## 2022-07-26 DIAGNOSIS — R079 Chest pain, unspecified: Secondary | ICD-10-CM | POA: Diagnosis not present

## 2022-07-26 DIAGNOSIS — I700 Atherosclerosis of aorta: Secondary | ICD-10-CM | POA: Diagnosis not present

## 2022-07-26 LAB — CBC WITH DIFFERENTIAL/PLATELET
Abs Immature Granulocytes: 0.02 10*3/uL (ref 0.00–0.07)
Basophils Absolute: 0.1 10*3/uL (ref 0.0–0.1)
Basophils Relative: 1 %
Eosinophils Absolute: 0.2 10*3/uL (ref 0.0–0.5)
Eosinophils Relative: 2 %
HCT: 53.2 % — ABNORMAL HIGH (ref 39.0–52.0)
Hemoglobin: 18.1 g/dL — ABNORMAL HIGH (ref 13.0–17.0)
Immature Granulocytes: 0 %
Lymphocytes Relative: 18 %
Lymphs Abs: 1.5 10*3/uL (ref 0.7–4.0)
MCH: 32 pg (ref 26.0–34.0)
MCHC: 34 g/dL (ref 30.0–36.0)
MCV: 94 fL (ref 80.0–100.0)
Monocytes Absolute: 0.5 10*3/uL (ref 0.1–1.0)
Monocytes Relative: 6 %
Neutro Abs: 5.9 10*3/uL (ref 1.7–7.7)
Neutrophils Relative %: 73 %
Platelets: 173 10*3/uL (ref 150–400)
RBC: 5.66 MIL/uL (ref 4.22–5.81)
RDW: 11.9 % (ref 11.5–15.5)
WBC: 8.2 10*3/uL (ref 4.0–10.5)
nRBC: 0 % (ref 0.0–0.2)

## 2022-07-26 LAB — BASIC METABOLIC PANEL
Anion gap: 8 (ref 5–15)
BUN: 14 mg/dL (ref 6–20)
CO2: 23 mmol/L (ref 22–32)
Calcium: 9 mg/dL (ref 8.9–10.3)
Chloride: 104 mmol/L (ref 98–111)
Creatinine, Ser: 0.7 mg/dL (ref 0.61–1.24)
GFR, Estimated: >60 mL/min (ref >60)
Glucose, Bld: 375 mg/dL — ABNORMAL HIGH (ref 70–99)
Potassium: 4.2 mmol/L (ref 3.5–5.1)
Sodium: 135 mmol/L (ref 135–145)

## 2022-07-26 LAB — RESP PANEL BY RT-PCR (FLU A&B, COVID) ARPGX2
Influenza A by PCR: NEGATIVE
Influenza B by PCR: NEGATIVE
SARS Coronavirus 2 by RT PCR: NEGATIVE

## 2022-07-26 LAB — MONONUCLEOSIS SCREEN: Mono Screen: NEGATIVE

## 2022-07-26 LAB — TROPONIN I (HIGH SENSITIVITY)
Troponin I (High Sensitivity): 3 ng/L (ref <18)
Troponin I (High Sensitivity): 3 ng/L (ref <18)

## 2022-07-26 LAB — SEDIMENTATION RATE: Sed Rate: 5 mm/hr (ref 0–20)

## 2022-07-26 LAB — GROUP A STREP BY PCR: Group A Strep by PCR: NOT DETECTED

## 2022-07-26 MED ORDER — IOHEXOL 300 MG/ML  SOLN
100.0000 mL | Freq: Once | INTRAMUSCULAR | Status: AC | PRN
  Administered 2022-07-26: 100 mL via INTRAVENOUS

## 2022-07-26 NOTE — Discharge Instructions (Signed)
Your CAT scan shows that you have a lot of swelling near your sternoclavicular joint or you have noticed the swelling.  Your blood tests are normal, does not appear to be infected.  Please follow-up with the orthopedic surgeon.  Call tomorrow to make an appointment.  Please return if you do notice any redness or warmth to this area or if you develop a fever.  It was a pleasure caring for you today.

## 2022-07-26 NOTE — ED Provider Notes (Signed)
[facility] Provider Note    Event Date/Time   First MD Initiated Contact with Patient 07/26/22 1644     (approximate)   History   Lymphadenopathy (/) and Chest Pain   HPI  Karl Barrett is a 51 y.o. male with a past medical history of hyperlipidemia, type 2 diabetes, Barrett's esophagus, peptic ulcer who presents today for evaluation of what he thinks are swollen lymph nodes on the right side of his neck for the past 1 week.  He reports that he has pain across his right side chest near his clavicle and into his shoulders.  He denies trouble breathing or swallowing.  He has noticed swelling to his right clavicular region.  He denies recently being incarcerated or living in close proximity with multiple people.  He denies cough or hemoptysis.  He reports that he has been smoking 1+ packs per day for decades.  He reports that he has been sweaty at night.  He has not had any specific weight loss.  Patient Active Problem List   Diagnosis Date Noted   Current moderate episode of major depressive disorder without prior episode ([place]) 07/13/2019   Hyperlipidemia associated with type 2 diabetes mellitus ([place]) 09/16/2017   Arthritis of hand 09/16/2017   History of kidney stones 12/08/2016   Tobacco use 12/04/2015   Plantar fasciitis 12/04/2015   Barrett's esophagus with dysplasia 02/19/2015   Carpal tunnel syndrome 02/19/2015   Chronic peptic ulcer 02/19/2015   Type II diabetes mellitus with complication ([place]) 71/69/6789   Arthritis of knee, degenerative 02/19/2015   Recurrent genital herpes 02/19/2015          Physical Exam   Triage Vital Signs: ED Triage Vitals [07/26/22 1323]  Enc Vitals Group     BP (!) 141/75     Pulse Rate 93     Resp 18     Temp 98.2 F (36.8 C)     Temp Source Oral     SpO2 97 %     Weight      Height      Head Circumference      Peak Flow      Pain Score 3     Pain Loc      Pain Edu?      Excl. in [place]?     Most  recent vital signs: Vitals:   07/26/22 1323 07/26/22 1701  BP: (!) 141/75 133/79  Pulse: 93 65  Resp: 18 17  Temp: 98.2 F (36.8 C) 98.5 F (36.9 C)  SpO2: 97% 97%    Physical Exam Vitals and nursing note reviewed.  Constitutional:      General: Awake and alert. No acute distress.    Appearance: Normal appearance. The patient is normal weight.  HENT:     Head: Normocephalic and atraumatic.     Mouth: Mucous membranes are moist.  Eyes:     General: PERRL. Normal EOMs        Right eye: No discharge.        Left eye: No discharge.     Conjunctiva/sclera: Conjunctivae normal.  Cardiovascular:     Rate and Rhythm: Normal rate and regular rhythm.     Pulses: Normal pulses.  Pulmonary:     Effort: Pulmonary effort is normal. No respiratory distress.     Breath sounds: Normal breath sounds.  Abdominal:     Abdomen is soft. There is no abdominal tenderness. No rebound or guarding. No distention. Musculoskeletal:  General: No swelling. Normal range of motion.     Cervical back: Normal range of motion and neck supple.  Soft tissue swelling over the medial right clavicle area.  No erythema or warmth noted.  No wounds noted.  Patient has full normal range of motion of bilateral shoulders.  No cervical spine tenderness. Skin:    General: Skin is warm and dry.     Capillary Refill: Capillary refill takes less than 2 seconds.     Findings: No rash.  Neurological:     Mental Status: The patient is awake and alert.      ED Results / Procedures / Treatments   Labs (all labs ordered are listed, but only abnormal results are displayed) Labs Reviewed  BASIC METABOLIC PANEL - Abnormal; Notable for the following components:      Result Value   Glucose, Bld 375 (*)    All other components within normal limits  CBC WITH DIFFERENTIAL/PLATELET - Abnormal; Notable for the following components:   Hemoglobin 18.1 (*)    HCT 53.2 (*)    All other components within normal limits   GROUP A STREP BY PCR  RESP PANEL BY RT-PCR (FLU A&B, COVID) [id number]  SEDIMENTATION RATE  MONONUCLEOSIS SCREEN  TROPONIN I (HIGH SENSITIVITY)  TROPONIN I (HIGH SENSITIVITY)     EKG     RADIOLOGY I independently reviewed and interpreted imaging and agree with radiologists findings.     PROCEDURES:  Critical Care performed:   Procedures   MEDICATIONS ORDERED IN ED: Medications  iohexol (OMNIPAQUE) 300 MG/ML solution 100 mL (100 mLs Intravenous Contrast Given 07/26/22 1812)     IMPRESSION / MDM / ASSESSMENT AND PLAN / ED COURSE  I reviewed the triage vital signs and the nursing notes.   Differential diagnosis includes, but is not limited to, lymphadenopathy, soft tissue swelling, lipoma, infection.  Patient is awake and alert, hemodynamically stable and afebrile.  Labs obtained in triage are overall quite reassuring, normal white blood cell count, negative ESR.  X-ray demonstrates no acute findings.  CT chest and neck also obtained for further evaluation and I spoke directly with the radiologist who reports that he sees sternoclavicular inflammatory changes.  He denies seeing any lymphadenopathy.  He denies seeing any concerning infectious signs.  Given the patient's negative inflammatory markers and normal white blood cell count, I do not suspect a septic sternoclavicular joint.  Furthermore, there is no overlying erythema or warmth.  He has not had any constitutional symptoms.  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  He was advised to follow-up with orthopedic surgery.  We discussed return precautions and the importance of close outpatient follow-up.  He understands return precautions for signs of infection or other concerning findings.  Patient understands and agrees with plan.   Patient's presentation is most consistent with acute complicated illness / injury requiring diagnostic  workup.    FINAL CLINICAL IMPRESSION(S) / ED DIAGNOSES   Final diagnoses:  Sternoclavicular joint pain, right     Rx / DC Orders   ED Discharge Orders     None        Note:  This document was prepared using Dragon voice recognition software and may include unintentional dictation errors.   Emeline Gins 07/26/22 2214    Lucillie Garfinkel, MD 07/27/22 [phone]

## 2022-07-26 NOTE — ED Provider Triage Note (Signed)
Emergency Medicine Provider Triage Evaluation Note  Karl Barrett , a 51 y.o. male  was evaluated in triage.  Pt complains of lymphadenopathy to the right clavicle area for the past few days. He has had a right side headache similar to previous, but no URI symptoms. He is also having some midsternal chest pain and right shoulder pain. No known fever. He does smoke cigarettes.  Physical Exam  BP (!) 141/75 (BP Location: Left Arm)   Pulse 93   Temp 98.2 F (36.8 C) (Oral)   Resp 18   SpO2 97%  Gen:   Awake, no distress   Resp:  Normal effort  MSK:   Moves extremities without difficulty  Other:    Medical Decision Making  Medically screening exam initiated at 1:26 PM.  Appropriate orders placed.  Karl Barrett was informed that the remainder of the evaluation will be completed by another provider, this initial triage assessment does not replace that evaluation, and the importance of remaining in the ED until their evaluation is complete.     Chinita Pester, FNP 07/26/22 1336

## 2022-07-26 NOTE — ED Triage Notes (Signed)
Patient to ED via POV for swollen lymph nodes on right side of neck for the past few days. Also reporting "some" CP- worse last night. Reports headache, congestion, and right shoulder pain.

## 2022-08-04 DIAGNOSIS — M5412 Radiculopathy, cervical region: Secondary | ICD-10-CM | POA: Diagnosis not present

## 2022-08-04 DIAGNOSIS — M25511 Pain in right shoulder: Secondary | ICD-10-CM | POA: Diagnosis not present

## 2022-08-04 DIAGNOSIS — M19011 Primary osteoarthritis, right shoulder: Secondary | ICD-10-CM | POA: Diagnosis not present

## 2022-08-04 DIAGNOSIS — M542 Cervicalgia: Secondary | ICD-10-CM | POA: Diagnosis not present

## 2022-08-04 DIAGNOSIS — M25512 Pain in left shoulder: Secondary | ICD-10-CM | POA: Diagnosis not present

## 2022-08-04 DIAGNOSIS — M19012 Primary osteoarthritis, left shoulder: Secondary | ICD-10-CM | POA: Diagnosis not present

## 2023-08-19 ENCOUNTER — Ambulatory Visit: Payer: Medicaid Other | Admitting: Family Medicine

## 2023-08-19 ENCOUNTER — Encounter: Payer: Self-pay | Admitting: Family Medicine

## 2023-08-19 VITALS — BP 117/77 | HR 66 | Resp 16 | Ht 73.0 in | Wt 212.5 lb

## 2023-08-19 DIAGNOSIS — L02211 Cutaneous abscess of abdominal wall: Secondary | ICD-10-CM

## 2023-08-19 DIAGNOSIS — F32A Depression, unspecified: Secondary | ICD-10-CM | POA: Insufficient documentation

## 2023-08-19 DIAGNOSIS — E1169 Type 2 diabetes mellitus with other specified complication: Secondary | ICD-10-CM

## 2023-08-19 DIAGNOSIS — F39 Unspecified mood [affective] disorder: Secondary | ICD-10-CM | POA: Diagnosis not present

## 2023-08-19 DIAGNOSIS — E1165 Type 2 diabetes mellitus with hyperglycemia: Secondary | ICD-10-CM

## 2023-08-19 DIAGNOSIS — Z114 Encounter for screening for human immunodeficiency virus [HIV]: Secondary | ICD-10-CM | POA: Insufficient documentation

## 2023-08-19 DIAGNOSIS — J439 Emphysema, unspecified: Secondary | ICD-10-CM | POA: Diagnosis not present

## 2023-08-19 DIAGNOSIS — F17200 Nicotine dependence, unspecified, uncomplicated: Secondary | ICD-10-CM | POA: Insufficient documentation

## 2023-08-19 DIAGNOSIS — Z0001 Encounter for general adult medical examination with abnormal findings: Secondary | ICD-10-CM

## 2023-08-19 DIAGNOSIS — Z0000 Encounter for general adult medical examination without abnormal findings: Secondary | ICD-10-CM | POA: Insufficient documentation

## 2023-08-19 DIAGNOSIS — Z1159 Encounter for screening for other viral diseases: Secondary | ICD-10-CM

## 2023-08-19 DIAGNOSIS — K22719 Barrett's esophagus with dysplasia, unspecified: Secondary | ICD-10-CM

## 2023-08-19 DIAGNOSIS — E1140 Type 2 diabetes mellitus with diabetic neuropathy, unspecified: Secondary | ICD-10-CM

## 2023-08-19 DIAGNOSIS — Z860100 Personal history of colon polyps, unspecified: Secondary | ICD-10-CM | POA: Insufficient documentation

## 2023-08-19 DIAGNOSIS — E1159 Type 2 diabetes mellitus with other circulatory complications: Secondary | ICD-10-CM

## 2023-08-19 DIAGNOSIS — I152 Hypertension secondary to endocrine disorders: Secondary | ICD-10-CM

## 2023-08-19 DIAGNOSIS — M150 Primary generalized (osteo)arthritis: Secondary | ICD-10-CM | POA: Insufficient documentation

## 2023-08-19 DIAGNOSIS — Z125 Encounter for screening for malignant neoplasm of prostate: Secondary | ICD-10-CM | POA: Insufficient documentation

## 2023-08-19 MED ORDER — LISINOPRIL 5 MG PO TABS
5.0000 mg | ORAL_TABLET | Freq: Every day | ORAL | 3 refills | Status: AC
  Filled 2023-12-22: qty 90, 90d supply, fill #0

## 2023-08-19 MED ORDER — ESCITALOPRAM OXALATE 10 MG PO TABS
10.0000 mg | ORAL_TABLET | Freq: Every day | ORAL | 1 refills | Status: AC
  Filled 2023-12-22: qty 90, 90d supply, fill #0

## 2023-08-19 MED ORDER — BUDESONIDE-FORMOTEROL FUMARATE 160-4.5 MCG/ACT IN AERO
2.0000 | INHALATION_SPRAY | Freq: Two times a day (BID) | RESPIRATORY_TRACT | 11 refills | Status: DC
  Filled 2023-12-22 – 2024-01-10 (×2): qty 10.2, 30d supply, fill #0

## 2023-08-19 MED ORDER — ALBUTEROL SULFATE HFA 108 (90 BASE) MCG/ACT IN AERS: 1.0000 | INHALATION_SPRAY | Freq: Four times a day (QID) | RESPIRATORY_TRACT | 0 refills | Status: DC | PRN

## 2023-08-19 MED ORDER — EMPAGLIFLOZIN 25 MG PO TABS: 25.0000 mg | ORAL_TABLET | Freq: Every day | ORAL | 0 refills | Status: DC

## 2023-08-19 MED ORDER — GLIPIZIDE ER 10 MG PO TB24: 10.0000 mg | ORAL_TABLET | Freq: Every day | ORAL | 0 refills | Status: DC

## 2023-08-19 MED ORDER — ROSUVASTATIN CALCIUM 40 MG PO TABS
40.0000 mg | ORAL_TABLET | Freq: Every day | ORAL | 3 refills | Status: AC
  Filled 2023-12-22: qty 90, 90d supply, fill #0

## 2023-08-19 MED ORDER — SULFAMETHOXAZOLE-TRIMETHOPRIM 800-160 MG PO TABS
1.0000 | ORAL_TABLET | Freq: Two times a day (BID) | ORAL | 0 refills | Status: DC
Start: 2023-08-19 — End: 2023-09-07

## 2023-08-19 NOTE — Assessment & Plan Note (Signed)
Referral back to gastro for further care

## 2023-08-19 NOTE — Assessment & Plan Note (Signed)
Chronic, uncontrolled Reports chronic dyspnea; but declines tobacco cessation given failed attempts at Chantix and Wellbutrin Recommend referral for LDLCT

## 2023-08-19 NOTE — Assessment & Plan Note (Signed)
Denies LUTS; recommend PSA in place of DRE. If PSA is elevated for age, we will repeat; if PSA remains elevated pt will be referred to urology for DRE and next steps for best treatment.  

## 2023-08-19 NOTE — Assessment & Plan Note (Signed)

## 2023-08-19 NOTE — Assessment & Plan Note (Signed)
Chronic,worsening Defer med start at this time given labs pending  Consider start of APAP at 1000 mg TID vs addition of celebrex vs gaba to further assist

## 2023-08-19 NOTE — Assessment & Plan Note (Signed)
Chronic, denies assistance Reports unable to afford use of OTC lozenges, gums, patches to assist Brief discuss regarding risks of tobacco/nicotine use and recommendations on ways to reduce use and work towards cessation of use of tobacco/nicotine products. Encouraged to use 1-800-QUIT-NOW.

## 2023-08-19 NOTE — Assessment & Plan Note (Signed)
Low risk screen Treatable, and curable. If left untreated Hep C can lead to cirrhosis and liver failure. Encourage routine testing; recommend repeat testing if risk factors change.  

## 2023-08-19 NOTE — Progress Notes (Signed)
New patient visit  Patient: Karl Barrett   DOB: [birth year]   52 y.o. Male  MRN: [id number] Visit Date: 08/19/2023  Today's healthcare provider: [name], FNP  Introduced to nurse practitioner role and practice setting.  All questions answered.  Discussed provider/patient relationship and expectations.  Chief Complaint  Patient presents with   Establish Care   Shortness of Breath    Has experienced intermittent shortness of breath for about one year. Usually with exertion, but sometimes at rest.   Subjective    Karl Barrett is a 52 y.o. male who presents today as a new patient to establish care.   HPI     Shortness of Breath    Additional comments: Has experienced intermittent shortness of breath for about one year. Usually with exertion, but sometimes at rest.      Last edited by Ashok Cordia, CMA on 08/19/2023  1:33 PM.      Recently got Medicaid; request for restart of chronic medications. Reports ongoing struggles with SDOH concerns. Has been out of work d/t chronic illness and pain. Reports ongoing financial struggles.  Past Medical History:  Diagnosis Date   Barrett esophagus    Depression    Hyperlipidemia    Prediabetes    Past Surgical History:  Procedure Laterality Date   COLONOSCOPY  2008   COLONOSCOPY  02/02/2018   Tubular adenoma   ESOPHAGOGASTRODUODENOSCOPY  2010   ESOPHAGOGASTRODUODENOSCOPY  02/02/2018   no dysplasia, no Barrett   LITHOTRIPSY     Family Status  Relation Name Status   Mother  Deceased   Father  Deceased   Sister  Alive   Brother  Alive   MGF  (Not Specified)  No partnership data on file   Family History  Problem Relation Age of Onset   Hypertension Mother    Heart disease Mother    Thyroid disease Mother    Pulmonary fibrosis Mother    Heart disease Father    Diabetes Father    Cancer Maternal Grandfather        pancreatic   Social History   Socioeconomic History   Marital status: Divorced     Spouse name: Not on file   Number of children: Not on file   Years of education: Not on file   Highest education level: Not on file  Occupational History   Not on file  Tobacco Use   Smoking status: Every Day    Current packs/day: 1.25    Average packs/day: 1.3 packs/day for 28.0 years (35.0 ttl pk-yrs)    Types: Cigarettes   Smokeless tobacco: Never  Vaping Use   Vaping status: Former  Substance and Sexual Activity   Alcohol use: Not Currently   Drug use: No   Sexual activity: Not Currently  Other Topics Concern   Not on file  Social History Narrative   Not on file   Social Drivers of Health   Financial Resource Strain: Not on file  Food Insecurity: Not on file  Transportation Needs: Not on file  Physical Activity: Not on file  Stress: Not on file  Social Connections: Not on file   Outpatient Medications Prior to Visit  Medication Sig   [DISCONTINUED] atorvastatin (LIPITOR) 10 MG tablet TAKE 1 TABLET BY MOUTH EVERY DAY   [DISCONTINUED] glimepiride (AMARYL) 2 MG tablet TAKE 1 TABLET BY MOUTH EVERY DAY BEFORE BREAKFAST   [DISCONTINUED] escitalopram (LEXAPRO) 10 MG tablet Take 1 tablet (10 mg total) by  mouth daily. (Patient not taking: Reported on 08/19/2023)   [DISCONTINUED] metFORMIN (GLUCOPHAGE-XR) 500 MG 24 hr tablet Take 2 tablets (1,000 mg total) by mouth every morning. (Patient not taking: Reported on 08/19/2023)   No facility-administered medications prior to visit.   No Known Allergies  Immunization History  Administered Date(s) Administered   Influenza,inj,Quad PF,6+ Mos 09/16/2017, 09/26/2018, 04/20/2019, 05/30/2021   Pneumococcal Polysaccharide-23 09/26/2018   Tdap 05/03/2017    Health Maintenance  Topic Date Due   HIV Screening  Never done   Zoster Vaccines- Shingrix (1 of 2) Never done   OPHTHALMOLOGY EXAM  09/08/2019   Pneumococcal Vaccine 37-40 Years old (2 of 2 - PCV) 09/27/2019   FOOT EXAM  02/22/2020   HEMOGLOBIN A1C  11/28/2021   Diabetic  kidney evaluation - Urine ACR  05/30/2022   COVID-19 Vaccine (1 - 2024-25 season) Never done   Lung Cancer Screening  07/27/2023   Diabetic kidney evaluation - eGFR measurement  07/27/2023   INFLUENZA VACCINE  11/22/2023 (Originally 03/25/2023)   DTaP/Tdap/Td (2 - Td or Tdap) 05/04/2027   Colonoscopy  02/03/2028   Hepatitis C Screening  Completed   HPV VACCINES  Aged Out    Patient Care Team: Pardue, Monico Blitz, DO as PCP - General (Family Medicine) Christena Deem, MD (Inactive) as Consulting Physician (Gastroenterology) Thereasa Solo [facility])  Review of Systems  Respiratory:  Positive for shortness of breath.    Last CBC Lab Results  Component Value Date   WBC 8.2 07/26/2022   HGB 18.1 (H) 07/26/2022   HCT 53.2 (H) 07/26/2022   MCV 94.0 07/26/2022   MCH 32.0 07/26/2022   RDW 11.9 07/26/2022   PLT 173 07/26/2022   Last metabolic panel Lab Results  Component Value Date   GLUCOSE 375 (H) 07/26/2022   NA 135 07/26/2022   K 4.2 07/26/2022   CL 104 07/26/2022   CO2 23 07/26/2022   BUN 14 07/26/2022   CREATININE 0.70 07/26/2022   GFRNONAA >60 07/26/2022   CALCIUM 9.0 07/26/2022   PROT 6.6 05/30/2021   ALBUMIN 4.5 05/30/2021   LABGLOB 2.1 05/30/2021   AGRATIO 2.1 05/30/2021   BILITOT 0.8 05/30/2021   ALKPHOS 79 05/30/2021   AST 16 05/30/2021   ALT 19 05/30/2021   ANIONGAP 8 07/26/2022   Last lipids Lab Results  Component Value Date   CHOL 153 05/30/2021   HDL 45 05/30/2021   LDLCALC 94 05/30/2021   TRIG 72 05/30/2021   CHOLHDL 3.4 05/30/2021   Last hemoglobin A1c Lab Results  Component Value Date   HGBA1C 7.8 (H) 05/30/2021   Last thyroid functions Lab Results  Component Value Date   TSH 2.910 12/08/2016   Last vitamin D No results found for: "25OHVITD2", "25OHVITD3", "VD25OH" Last vitamin B12 and Folate No results found for: "VITAMINB12", "FOLATE"    Objective    BP 117/77   Pulse 66   Resp 16   Ht 6\' 1"  (1.854 m)   Wt 212 lb 8 oz  (96.4 kg)   SpO2 98%   BMI 28.04 kg/m   BP Readings from Last 3 Encounters:  08/19/23 117/77  07/26/22 133/79  06/05/21 121/73   Wt Readings from Last 3 Encounters:  08/19/23 212 lb 8 oz (96.4 kg)  06/05/21 211 lb 6.7 oz (95.9 kg)  05/30/21 210 lb 3.2 oz (95.3 kg)   SpO2 Readings from Last 3 Encounters:  08/19/23 98%  07/26/22 97%  06/05/21 97%   Physical Exam Vitals and nursing  note reviewed.  Constitutional:      General: He is awake. He is not in acute distress.    Appearance: Normal appearance. He is well-developed, well-groomed and overweight. He is not ill-appearing, toxic-appearing or diaphoretic.  HENT:     Head: Normocephalic and atraumatic.     Jaw: There is normal jaw occlusion. No trismus, tenderness, swelling or pain on movement.     Salivary Glands: Right salivary gland is not diffusely enlarged or tender. Left salivary gland is not diffusely enlarged or tender.     Right Ear: Hearing, tympanic membrane, ear canal and external ear normal. There is no impacted cerumen.     Left Ear: Hearing, tympanic membrane, ear canal and external ear normal. There is no impacted cerumen.     Nose: Nose normal. No congestion or rhinorrhea.     Right Turbinates: Not enlarged, swollen or pale.     Left Turbinates: Not enlarged, swollen or pale.     Right Sinus: No maxillary sinus tenderness or frontal sinus tenderness.     Left Sinus: No maxillary sinus tenderness or frontal sinus tenderness.     Mouth/Throat:     Lips: Pink.     Mouth: Mucous membranes are moist. No injury, lacerations, oral lesions or angioedema.     Pharynx: Oropharynx is clear. Uvula midline. No pharyngeal swelling, oropharyngeal exudate or posterior oropharyngeal erythema.     Tonsils: No tonsillar exudate or tonsillar abscesses.  Eyes:     General: Lids are normal. Vision grossly intact. Gaze aligned appropriately.        Right eye: No discharge.        Left eye: No discharge.     Extraocular Movements:  Extraocular movements intact.     Conjunctiva/sclera: Conjunctivae normal.     Pupils: Pupils are equal, round, and reactive to light.  Neck:     Thyroid: No thyroid mass, thyromegaly or thyroid tenderness.     Vascular: No carotid bruit.     Trachea: Trachea normal. No tracheal tenderness.  Cardiovascular:     Rate and Rhythm: Normal rate and regular rhythm.     Pulses: Normal pulses.          Carotid pulses are 2+ on the right side and 2+ on the left side.      Radial pulses are 2+ on the right side and 2+ on the left side.       Femoral pulses are 2+ on the right side and 2+ on the left side.      Popliteal pulses are 2+ on the right side and 2+ on the left side.       Dorsalis pedis pulses are 2+ on the right side and 2+ on the left side.       Posterior tibial pulses are 2+ on the right side and 2+ on the left side.     Heart sounds: Normal heart sounds, S1 normal and S2 normal. No murmur heard.    No friction rub. No gallop.  Pulmonary:     Effort: Pulmonary effort is normal. No respiratory distress.     Breath sounds: Normal breath sounds and air entry. No stridor. No wheezing, rhonchi or rales.  Chest:     Chest wall: No tenderness.  Abdominal:     General: Abdomen is flat. Bowel sounds are normal. There is no distension.     Palpations: Abdomen is soft. There is no mass.     Tenderness: There is no abdominal tenderness.  There is no guarding or rebound.     Hernia: No hernia is present.    Genitourinary:    Comments: Exam deferred; denies complaints Musculoskeletal:        General: No swelling, tenderness, deformity or signs of injury. Normal range of motion.     Cervical back: Normal range of motion and neck supple. No rigidity or tenderness.     Right lower leg: No edema.     Left lower leg: No edema.  Lymphadenopathy:     Cervical: No cervical adenopathy.     Right cervical: No superficial, deep or posterior cervical adenopathy.    Left cervical: No superficial,  deep or posterior cervical adenopathy.  Skin:    General: Skin is warm and dry.     Capillary Refill: Capillary refill takes less than 2 seconds.     Coloration: Skin is not jaundiced or pale.     Findings: No bruising, erythema, lesion or rash.  Neurological:     General: No focal deficit present.     Mental Status: He is alert and oriented to person, place, and time. Mental status is at baseline.     GCS: GCS eye subscore is 4. GCS verbal subscore is 5. GCS motor subscore is 6.     Sensory: Sensation is intact. No sensory deficit.     Motor: Motor function is intact. No weakness.     Coordination: Coordination is intact.     Gait: Gait is intact.  Psychiatric:        Attention and Perception: Attention and perception normal.        Mood and Affect: Mood and affect normal.        Speech: Speech normal.        Behavior: Behavior normal. Behavior is cooperative.        Thought Content: Thought content normal.        Cognition and Memory: Cognition normal.        Judgment: Judgment normal.    Depression Screen    08/19/2023    1:34 PM 05/30/2021   10:26 AM 07/13/2019    9:04 AM 04/20/2019    9:19 AM  PHQ 2/9 Scores  PHQ - 2 Score 6 2 0 2  PHQ- 9 Score 23 9 0 6   No results found for any visits on 08/19/23.  Assessment & Plan      Problem List Items Addressed This Visit       Cardiovascular and Mediastinum   Hypertension associated with diabetes (HCC)   Chronic, borderline Goal remains <119/<79 Recommend start of low dose ACEi to assist with prevention of kidney concerns       Relevant Medications   empagliflozin (JARDIANCE) 25 MG TABS tablet   glipiZIDE (GLUCOTROL XL) 10 MG 24 hr tablet   lisinopril (ZESTRIL) 5 MG tablet   rosuvastatin (CRESTOR) 40 MG tablet   Other Relevant Orders   CBC with Differential/Platelet   Comprehensive Metabolic Panel (CMET)   TSH   AMB Referral VBCI Care Management   Ambulatory referral to Ophthalmology     Respiratory    Pulmonary emphysema (HCC)   Chronic, uncontrolled Reports chronic dyspnea; but declines tobacco cessation given failed attempts at Chantix and Wellbutrin Recommend referral for LDLCT      Relevant Medications   albuterol (PROVENTIL HFA) 108 (90 Base) MCG/ACT inhaler   budesonide-formoterol (SYMBICORT) 160-4.5 MCG/ACT inhaler   rosuvastatin (CRESTOR) 40 MG tablet   Other Relevant Orders   AMB Referral  VBCI Care Management     Digestive   Barrett's esophagus with dysplasia   Chronic, variable Declines medication start given stool changes and concerns for worsening hemorrhoids following use of PPI- was on Nexium       Relevant Orders   AMB Referral VBCI Care Management   Ambulatory referral to Gastroenterology     Endocrine   Hyperlipidemia associated with type 2 diabetes mellitus (HCC)   Chronic, currently untreated Recommend high dose statin given chronic tobacco abuse with known DM Goal remains <70 LDL      Relevant Medications   empagliflozin (JARDIANCE) 25 MG TABS tablet   glipiZIDE (GLUCOTROL XL) 10 MG 24 hr tablet   lisinopril (ZESTRIL) 5 MG tablet   rosuvastatin (CRESTOR) 40 MG tablet   Other Relevant Orders   AMB Referral VBCI Care Management   Lipid panel   Type 2 diabetes mellitus with diabetic neuropathy, without long-term current use of insulin (HCC)   Chronic, worsening Reports bilateral hands, feet, tip of tongue  Consider start of gaba to further assist       Relevant Medications   empagliflozin (JARDIANCE) 25 MG TABS tablet   glipiZIDE (GLUCOTROL XL) 10 MG 24 hr tablet   lisinopril (ZESTRIL) 5 MG tablet   rosuvastatin (CRESTOR) 40 MG tablet   Other Relevant Orders   Hemoglobin A1c   Urine Microalbumin w/creat. ratio   AMB Referral VBCI Care Management   Ambulatory referral to Ophthalmology   Uncontrolled type 2 diabetes mellitus with hyperglycemia (HCC)   Chronic, uncontrolled x8+ years; dx'd 2016 Recommend A1c and urine micro to assist Declines  start of metformin given chronic joint pains Continue to recommend balanced, lower carb meals. Smaller meal size, adding snacks. Choosing water as drink of choice and increasing purposeful exercise. Due for DM eye exam and DM foot exam       Relevant Medications   empagliflozin (JARDIANCE) 25 MG TABS tablet   glipiZIDE (GLUCOTROL XL) 10 MG 24 hr tablet   lisinopril (ZESTRIL) 5 MG tablet   rosuvastatin (CRESTOR) 40 MG tablet   Other Relevant Orders   Hemoglobin A1c   Urine Microalbumin w/creat. ratio   AMB Referral VBCI Care Management   Ambulatory referral to Ophthalmology     Musculoskeletal and Integument   Cutaneous abscess of abdominal wall   Acute on chronic, x 5-6 months Positive exudate today Recommend ABX and follow up with gen surg for cell wall removal       Relevant Orders   AMB Referral VBCI Care Management   Ambulatory referral to General Surgery   Primary osteoarthritis involving multiple joints   Chronic,worsening Defer med start at this time given labs pending  Consider start of APAP at 1000 mg TID vs addition of celebrex vs gaba to further assist      Relevant Orders   AMB Referral VBCI Care Management   C-reactive protein   Sed Rate (ESR)   Ambulatory referral to Orthopedic Surgery     Other   Annual physical exam - Primary   Things to do to keep yourself healthy  - Exercise at least 30-45 minutes a day, 3-4 days a week.  - Eat a low-fat diet with lots of fruits and vegetables, up to 7-9 servings per day.  - Seatbelts can save your life. Wear them always.  - Smoke detectors on every level of your home, check batteries every year.  - Eye Doctor - have an eye exam every 1-2 years  - Safe  sex - if you may be exposed to STDs, use a condom.  - Alcohol -  If you drink, do it moderately, less than 2 drinks per day.  - Health Care Power of Attorney. Choose someone to speak for you if you are not able.  - Depression is common in our stressful world.If you're  feeling down or losing interest in things you normally enjoy, please come in for a visit.  - Violence - If anyone is threatening or hurting you, please call immediately.       Encounter for hepatitis C screening test for low risk patient   Low risk screen Treatable, and curable. If left untreated Hep C can lead to cirrhosis and liver failure. Encourage routine testing; recommend repeat testing if risk factors change.       Relevant Orders   AMB Referral VBCI Care Management   Hepatitis C Antibody   Encounter for screening for HIV   Low risk screen Consented; encouraged to "know your status" Recommend repeat screen if risk factors change       Relevant Orders   AMB Referral VBCI Care Management   HIV antibody (with reflex)   Encounter for screening prostate specific antigen (PSA) measurement   Denies LUTS; recommend PSA in place of DRE. If PSA is elevated for age, we will repeat; if PSA remains elevated pt will be referred to urology for DRE and next steps for best treatment.       Relevant Orders   AMB Referral VBCI Care Management   PSA   History of colon polyps   Referral back to gastro for further care      Relevant Orders   Ambulatory referral to Gastroenterology   Mood disorder (HCC)   Chronic, uncontrolled Off lexapro x 1-2 years Reports ongoing stress of multiple SDOH concerns Denies SI or HI    08/19/2023    1:34 PM 05/30/2021   10:26 AM 02/22/2019    9:37 AM  GAD 7 : Generalized Anxiety Score  Nervous, Anxious, on Edge 1 1 2   Control/stop worrying 2 1 1   Worry too much - different things 2 1 3   Trouble relaxing 2 1 2   Restless 1 1 2   Easily annoyed or irritable 2 1 3   Afraid - awful might happen 1 0 1  Total GAD 7 Score 11 6 14   Anxiety Difficulty Very difficult Not difficult at all Very difficult       08/19/2023    1:34 PM 05/30/2021   10:26 AM 07/13/2019    9:04 AM  PHQ9 SCORE ONLY  PHQ-9 Total Score 23 9 0         Relevant Medications    escitalopram (LEXAPRO) 10 MG tablet   Other Relevant Orders   AMB Referral VBCI Care Management   Ambulatory referral to Psychiatry   Tobacco use disorder, severe, dependence   Chronic, denies assistance Reports unable to afford use of OTC lozenges, gums, patches to assist Brief discuss regarding risks of tobacco/nicotine use and recommendations on ways to reduce use and work towards cessation of use of tobacco/nicotine products. Encouraged to use 1-800-QUIT-NOW.       Relevant Medications   albuterol (PROVENTIL HFA) 108 (90 Base) MCG/ACT inhaler   budesonide-formoterol (SYMBICORT) 160-4.5 MCG/ACT inhaler   rosuvastatin (CRESTOR) 40 MG tablet   Other Relevant Orders   AMB Referral VBCI Care Management   Ambulatory Referral Lung Cancer Screening [facility]   VAS [place] AAA DUPLEX   Return in  about 3 months (around 11/17/2023) for chonic disease management, T2DM management.    Leilani Merl, FNP, have reviewed all documentation for this visit. The documentation on 08/19/23 for the exam, diagnosis, procedures, and orders are all accurate and complete.  [name], FNP  [facility] [phone] (phone) [phone] (fax)  [facility]

## 2023-08-19 NOTE — Assessment & Plan Note (Signed)
Chronic, variable Declines medication start given stool changes and concerns for worsening hemorrhoids following use of PPI- was on Nexium

## 2023-08-19 NOTE — Assessment & Plan Note (Signed)
Low risk screen ?Consented; encouraged to "know your status" ?Recommend repeat screen if risk factors change ? ?

## 2023-08-19 NOTE — Assessment & Plan Note (Signed)
Chronic, borderline Goal remains <119/<79 Recommend start of low dose ACEi to assist with prevention of kidney concerns

## 2023-08-19 NOTE — Assessment & Plan Note (Signed)
Chronic, worsening Reports bilateral hands, feet, tip of tongue  Consider start of gaba to further assist

## 2023-08-19 NOTE — Assessment & Plan Note (Signed)
Acute on chronic, x 5-6 months Positive exudate today Recommend ABX and follow up with gen surg for cell wall removal

## 2023-08-19 NOTE — Assessment & Plan Note (Signed)
Chronic, uncontrolled Off lexapro x 1-2 years Reports ongoing stress of multiple SDOH concerns Denies SI or HI    08/19/2023    1:34 PM 05/30/2021   10:26 AM 02/22/2019    9:37 AM  GAD 7 : Generalized Anxiety Score  Nervous, Anxious, on Edge 1 1 2   Control/stop worrying 2 1 1   Worry too much - different things 2 1 3   Trouble relaxing 2 1 2   Restless 1 1 2   Easily annoyed or irritable 2 1 3   Afraid - awful might happen 1 0 1  Total GAD 7 Score 11 6 14   Anxiety Difficulty Very difficult Not difficult at all Very difficult       08/19/2023    1:34 PM 05/30/2021   10:26 AM 07/13/2019    9:04 AM  PHQ9 SCORE ONLY  PHQ-9 Total Score 23 9 0

## 2023-08-19 NOTE — Assessment & Plan Note (Signed)
Chronic, currently untreated Recommend high dose statin given chronic tobacco abuse with known DM Goal remains <70 LDL

## 2023-08-19 NOTE — Assessment & Plan Note (Addendum)
Chronic, uncontrolled x8+ years; dx'd 2016 Recommend A1c and urine micro to assist Declines start of metformin given chronic joint pains Continue to recommend balanced, lower carb meals. Smaller meal size, adding snacks. Choosing water as drink of choice and increasing purposeful exercise. Due for DM eye exam and DM foot exam

## 2023-08-20 ENCOUNTER — Telehealth: Payer: Self-pay | Admitting: *Deleted

## 2023-08-20 LAB — LIPID PANEL
Chol/HDL Ratio: 4.3 {ratio} (ref 0.0–5.0)
Cholesterol, Total: 195 mg/dL (ref 100–199)
HDL: 45 mg/dL (ref >39)
LDL Chol Calc (NIH): 124 mg/dL — ABNORMAL HIGH (ref 0–99)
Triglycerides: 148 mg/dL (ref 0–149)
VLDL Cholesterol Cal: 26 mg/dL (ref 5–40)

## 2023-08-20 LAB — COMPREHENSIVE METABOLIC PANEL
ALT: 31 [IU]/L (ref 0–44)
AST: 16 [IU]/L (ref 0–40)
Albumin: 4.5 g/dL (ref 3.8–4.9)
Alkaline Phosphatase: 89 [IU]/L (ref 44–121)
BUN/Creatinine Ratio: 20 (ref 9–20)
BUN: 16 mg/dL (ref 6–24)
Bilirubin Total: 0.9 mg/dL (ref 0.0–1.2)
CO2: 23 mmol/L (ref 20–29)
Calcium: 10.3 mg/dL — ABNORMAL HIGH (ref 8.7–10.2)
Chloride: 101 mmol/L (ref 96–106)
Creatinine, Ser: 0.82 mg/dL (ref 0.76–1.27)
Globulin, Total: 2.2 g/dL (ref 1.5–4.5)
Glucose: 302 mg/dL — ABNORMAL HIGH (ref 70–99)
Potassium: 4.7 mmol/L (ref 3.5–5.2)
Sodium: 138 mmol/L (ref 134–144)
Total Protein: 6.7 g/dL (ref 6.0–8.5)
eGFR: 104 mL/min/{1.73_m2} (ref >59)

## 2023-08-20 LAB — MICROALBUMIN / CREATININE URINE RATIO
Creatinine, Urine: 72 mg/dL
Microalb/Creat Ratio: <4 mg/g{creat} (ref 0–29)
Microalbumin, Urine: <3 ug/mL

## 2023-08-20 LAB — HEMOGLOBIN A1C
Est. average glucose Bld gHb Est-mCnc: 237 mg/dL
Hgb A1c MFr Bld: 9.9 % — ABNORMAL HIGH (ref 4.8–5.6)

## 2023-08-20 LAB — CBC WITH DIFFERENTIAL/PLATELET
Basophils Absolute: 0.1 10*3/uL (ref 0.0–0.2)
Basos: 1 %
EOS (ABSOLUTE): 0.2 10*3/uL (ref 0.0–0.4)
Eos: 2 %
Hematocrit: 55.4 % — ABNORMAL HIGH (ref 37.5–51.0)
Hemoglobin: 18.5 g/dL — ABNORMAL HIGH (ref 13.0–17.7)
Immature Grans (Abs): 0 10*3/uL (ref 0.0–0.1)
Immature Granulocytes: 0 %
Lymphocytes Absolute: 2.1 10*3/uL (ref 0.7–3.1)
Lymphs: 24 %
MCH: 31.7 pg (ref 26.6–33.0)
MCHC: 33.4 g/dL (ref 31.5–35.7)
MCV: 95 fL (ref 79–97)
Monocytes Absolute: 0.6 10*3/uL (ref 0.1–0.9)
Monocytes: 7 %
Neutrophils Absolute: 5.9 10*3/uL (ref 1.4–7.0)
Neutrophils: 66 %
Platelets: 181 10*3/uL (ref 150–450)
RBC: 5.84 x10E6/uL — ABNORMAL HIGH (ref 4.14–5.80)
RDW: 11.5 % — ABNORMAL LOW (ref 11.6–15.4)
WBC: 8.8 10*3/uL (ref 3.4–10.8)

## 2023-08-20 LAB — TSH: TSH: 1.36 u[IU]/mL (ref 0.450–4.500)

## 2023-08-20 LAB — HIV ANTIBODY (ROUTINE TESTING W REFLEX): HIV Screen 4th Generation wRfx: NONREACTIVE

## 2023-08-20 LAB — HEPATITIS C ANTIBODY: Hep C Virus Ab: NONREACTIVE

## 2023-08-20 LAB — PSA: Prostate Specific Ag, Serum: 0.3 ng/mL (ref 0.0–4.0)

## 2023-08-20 LAB — SEDIMENTATION RATE: Sed Rate: 7 mm/h (ref 0–30)

## 2023-08-20 LAB — C-REACTIVE PROTEIN: CRP: 6 mg/L (ref 0–10)

## 2023-08-20 NOTE — Progress Notes (Signed)
Complex Care Management Note  Care Guide Note 08/20/2023 Name: Karl Barrett MRN: [id number] DOB: 21-Jul-1971  Karl Barrett is a 52 y.o. year old male who sees Pardue, Monico Blitz, DO for primary care. I reached out to [name] by phone today to offer complex care management services.  [name] was given information about Complex Care Management services today including:   The Complex Care Management services include support from the care team which includes your Nurse Coordinator, Clinical Social Worker, or Pharmacist.  The Complex Care Management team is here to help remove barriers to the health concerns and goals most important to you. Complex Care Management services are voluntary, and the patient may decline or stop services at any time by request to their care team member.   Complex Care Management Consent Status: Patient agreed to services and verbal consent obtained.   Follow up plan:  Telephone appointment with complex care management team member scheduled for:  12/31  Encounter Outcome:  Patient Scheduled  [facility] Coordination Care Guide  Direct Dial: [phone]

## 2023-08-20 NOTE — Progress Notes (Signed)
Labs have been reviewed -consistent elevation in RBC and Hgb and Hct. Consistent with tobacco abuse disorder. Continue to emphasize reduced use to assist with goal of cessation efforts. -blood chemistry stable with exception of excess glucose -A1c uncontrolled given absence of medication; goal remains <7%. Currently 9.9%. Start new regimen as discussed. -cholesterol is elevated; goal remains <70. Start new regimen as discussed. -remainder of labs are normal and stable; urine is pending

## 2023-08-23 ENCOUNTER — Telehealth: Payer: Self-pay | Admitting: *Deleted

## 2023-08-23 NOTE — Progress Notes (Signed)
Complex Care Management Note Care Guide Note  08/23/2023 Name: Karl Barrett MRN: [id number] DOB: 05/22/1971  Karl Barrett is a 52 y.o. year old male who is a primary care patient of [name], DO . The community resource team was consulted for assistance with Transportation Needs , Food Insecurity, and phone   SDOH screenings and interventions completed:  Yes  SDOH Interventions Today    Flowsheet Row Most Recent Value  SDOH Interventions   Food Insecurity Interventions [id number] Referral, [employer] Resources Provided  Housing Interventions [employer] Provided, Other (Comment)  [has lot rent and power and cable .]  Transportation Interventions [employer] Provided, SCAT (Specialized Community Area Transporation)  Psychologist, forensic information provided]  Utilities Interventions Intervention Not Indicated  [Told about Low income energy assitance program would not be helpful as utilities are not in his]        Care guide performed the following interventions: Patient provided with information about care guide support team and interviewed to confirm resource needs. Patient provided information on finding a new phone through [facility] or lifeline , sent food banks and provided information on ACTA and also medicaid transportation , information about  utilities and also medication assistance will reach out to pharmacy  Follow Up Plan:  No further follow up planned at this time. The patient has been provided with needed resources.  Encounter Outcome:  Patient Visit Completed [facility]  Population Health Careguide  Direct Dial: [phone] Website: [email or web address]

## 2023-08-24 ENCOUNTER — Other Ambulatory Visit: Payer: Self-pay | Admitting: *Deleted

## 2023-08-24 NOTE — Patient Outreach (Signed)
 Medicaid Managed Care   Nurse Care Manager Note  08/24/2023 Name:  Karl Barrett MRN:  [id number] DOB:  25-Nov-1970  Karl Barrett is an 52 y.o. year old male who is a primary patient of Donzella Lauraine SAILOR, DO.  [facility] Managed Care Coordination team was consulted for assistance with:    DMII Pain  Karl Barrett was given information about Medicaid Managed Care Coordination team services today. Karl Barrett Patient agreed to services and verbal consent obtained.  Engaged with patient by telephone for initial visit in response to provider referral for case management and/or care coordination services.   Patient is participating in a Managed Medicaid Plan:  Yes  Assessments/Interventions:  Review of past medical history, allergies, medications, health status, including review of consultants reports, laboratory and other test data, was performed as part of comprehensive evaluation and provision of chronic care management services.  SDOH (Social Drivers of Health) assessments and interventions performed: SDOH Interventions    Flowsheet Row Telephone from 08/23/2023 in [place] [facility] Office Visit from 05/30/2021 in [facility] at [facility] Office Visit from 04/20/2019 in [facility] Primary Care & [facility] at [facility] Office Visit from 02/22/2019 in [facility] Primary Care & Sports Medicine at [facility]  SDOH Interventions      Food Insecurity Interventions [id number] Referral, Community Resources Provided -- -- --  Housing Interventions [employer] Provided, Other (Comment)  [has lot rent and power and cable .] -- -- --  Development Worker, Community Provided, [employer] ([employer] Community [employer])  Psychologist, Forensic information provided] -- -- --  Utilities Interventions Intervention Not Indicated  [Told about Low income energy assitance program would not be helpful as utilities  are not in his] -- -- --  Depression Interventions/Treatment  -- Counseling Medication Medication       Care Plan  No Known Allergies  Medications Reviewed Today     Reviewed by Lucky Andrea LABOR, RN (Registered Nurse) on 08/24/23 at [phone]  Med List Status: <None>   Medication Order Taking? Sig Documenting Provider Last Dose Status Informant  albuterol  (PROVENTIL  HFA) 108 (90 Base) MCG/ACT inhaler [id number] Yes Inhale 1-2 puffs into the lungs every 6 (six) hours as needed for wheezing or shortness of breath. Emilio Kelly DASEN, FNP Taking Active   budesonide -formoterol  (SYMBICORT ) 160-4.5 MCG/ACT inhaler [id number] No Inhale 2 puffs into the lungs 2 (two) times daily.  Patient not taking: Reported on 08/24/2023   Emilio Kelly DASEN, FNP Not Taking Active   empagliflozin  (JARDIANCE ) 25 MG TABS tablet [id number] No Take 1 tablet (25 mg total) by mouth daily.  Patient not taking: Reported on 08/24/2023   Emilio Kelly DASEN, FNP Not Taking Active   escitalopram  (LEXAPRO ) 10 MG tablet [id number] No Take 1 tablet (10 mg total) by mouth at bedtime.  Patient not taking: Reported on 08/24/2023   Emilio Kelly DASEN, FNP Not Taking Active   glipiZIDE  (GLUCOTROL  XL) 10 MG 24 hr tablet [id number] No Take 1 tablet (10 mg total) by mouth daily with breakfast.  Patient not taking: Reported on 08/24/2023   Emilio Kelly DASEN, FNP Not Taking Active   lisinopril  (ZESTRIL ) 5 MG tablet [id number] No Take 1 tablet (5 mg total) by mouth daily.  Patient not taking: Reported on 08/24/2023   Emilio Kelly DASEN, FNP Not Taking Active   rosuvastatin  (CRESTOR ) 40 MG tablet [id number] No Take 1 tablet (40 mg total) by mouth daily.  Patient  not taking: Reported on 08/24/2023   Emilio Kelly DASEN, FNP Not Taking Active   sulfamethoxazole -trimethoprim  (BACTRIM  DS) 800-160 MG tablet [id number] No Take 1 tablet by mouth 2 (two) times daily.  Patient not taking: Reported on 08/24/2023   Emilio Kelly DASEN, FNP Not Taking Active             Patient  Active Problem List   Diagnosis Date Noted   Primary osteoarthritis involving multiple joints 08/19/2023   Mood disorder (HCC) 08/19/2023   Hypertension associated with diabetes (HCC) 08/19/2023   Uncontrolled type 2 diabetes mellitus with hyperglycemia (HCC) 08/19/2023   Type 2 diabetes mellitus with diabetic neuropathy, without long-term current use of insulin (HCC) 08/19/2023   Tobacco use disorder, severe, dependence 08/19/2023   Pulmonary emphysema (HCC) 08/19/2023   Encounter for screening prostate specific antigen (PSA) measurement 08/19/2023   Annual physical exam 08/19/2023   History of colon polyps 08/19/2023   Encounter for screening for HIV 08/19/2023   Encounter for hepatitis C screening test for low risk patient 08/19/2023   Cutaneous abscess of abdominal wall 08/19/2023   Hyperlipidemia associated with type 2 diabetes mellitus (HCC) 09/16/2017   Barrett's esophagus with dysplasia 02/19/2015    Conditions to be addressed/monitored per PCP order:  DMII and Pain  Care Plan : RN Care Manager Plan of Care  Updates made by Lucky Andrea LABOR, RN since 08/24/2023 12:00 AM     Problem: Health Management needs related to DM and Pain      Long-Range Goal: Development of Plan of Care to address Health Management needs related to DM and Pain   Start Date: 08/24/2023  Expected End Date: 11/22/2023  Note:   Current Barriers:  Chronic Disease Management support and education needs related to DMII and Pain   RNCM Clinical Goal(s):  Patient will verbalize understanding of plan for management of DMII and Pain as evidenced by Patient reports take all medications exactly as prescribed and will call provider for medication related questions as evidenced by patient reports and EMR attend all scheduled medical appointments: 08/26/23 with Ophthalmology, 08/30/23 with LCSW and 08/31/23 with Pharmacist as evidenced by provider documentation  through collaboration with RN Care manager, provider, and  care team.   Interventions: Evaluation of current treatment plan related to  self management and patient's adherence to plan as established by provider Provided patient with [employer] transportation information, advised to call [phone], 2-3 days prior to appointment to schedule transportation   Diabetes Interventions:  (Status:  New goal.) Long Term Goal Assessed patient's understanding of A1c goal: <7% Provided education to patient about basic DM disease process Reviewed medications with patient and discussed importance of medication adherence Counseled on importance of regular laboratory monitoring as prescribed Discussed plans with patient for ongoing care management follow up and provided patient with direct contact information for care management team Reviewed scheduled/upcoming provider appointments including: [facility] on 08/26/23, 08/30/23 with LCSW and 08/31/23 with Pharmacist Review of patient status, including review of consultants reports, relevant laboratory and other test results, and medications completed Assessed social determinant of health barriers Provided patient with information to [facility] at [facility] [phone], advised to contact regarding charge account for needed prescriptions Reviewed member benefits provided by [employer], advised patient to call member services [phone] to inquire Lab Results  Component Value Date   HGBA1C 9.9 (H) 08/19/2023    Pain Interventions:  (Status:  New goal.) Long Term Goal Pain assessment performed Medications reviewed Reviewed  provider established plan for pain management Discussed importance of adherence to all scheduled medical appointments Counseled on the importance of reporting any/all new or changed pain symptoms or management strategies to pain management provider Advised patient to report to care team affect of pain on daily activities Discussed use of relaxation techniques  and/or diversional activities to assist with pain reduction (distraction, imagery, relaxation, massage, acupressure, TENS, heat, and cold application Assessed social determinant of health barriers  Patient Goals/Self-Care Activities: Take all medications as prescribed Attend all scheduled provider appointments Call provider office for new concerns or questions  keep appointment with eye doctor drink 6 to 8 glasses of water each day fill half of plate with vegetables manage portion size Include a lean protein with each meal/snack Cut out soda and candy  Follow Up Plan:  Telephone follow up appointment with care management team member scheduled for:  09/23/23 at 9am      Follow Up:  Patient agrees to Care Plan and Follow-up.  Plan: The Managed Medicaid care management team will reach out to the patient again over the next 30 days.  Date/time of next scheduled RN care management/care coordination outreach:  09/23/23 at 9am  Andrea Dimes RN, BSN [place]  Value-Based Care [facility] RN Care Coordinator [phone]

## 2023-08-24 NOTE — Patient Instructions (Signed)
 Visit Information  Karl Barrett was given information about Medicaid Managed Care team care coordination services as a part of their [place] [employer] Medicaid benefit. Karl Barrett verbally consented to engagement with the [facility] Managed Care team.   If you are experiencing a medical emergency, please call 911 or report to your local emergency department or urgent care.   If you have a non-emergency medical problem during routine business hours, please contact your provider's office and ask to speak with a nurse.   For questions related to your [place] [employer] Medicaid health plan, please call: [phone]  If you would like to schedule transportation through your [place] [employer] Medicaid plan, please call the following number at least 2 days in advance of your appointment: [phone].   There is no limit to the number of trips during the year between medical appointments, healthcare facilities, or pharmacies. Transportation must be scheduled at least 2 business days before but not more than thirty 30 days before of your appointment.  Call the Behavioral Health Crisis Line at 401-557-1847, at any time, 24 hours a day, 7 days a week. If you are in danger or need immediate medical attention call 911.  If you would like help to quit smoking, call 1-800-QUIT-NOW (580-295-3997) OR Espaol: 1-855-Djelo-Ya (8-144-664-6430) o para ms informacin haga clic aqu or Text READY to 799-599 to register via text  Karl Barrett,   Please see education materials related to Diabetes and Pain Management provided as print materials.   The patient verbalized understanding of instructions, educational materials, and care plan provided today and agreed to receive a mailed copy of patient instructions, educational materials, and care plan.   Telephone follow up appointment with Managed Medicaid care management team member scheduled for:09/23/23 at 9am  Andrea Dimes RN, BSN [place]  Value-Based Care  [facility] RN Care Coordinator [phone]   Following is a copy of your plan of care:  Care Plan : RN Care Manager Plan of Care  Updates made by Dimes Andrea LABOR, RN since 08/24/2023 12:00 AM     Problem: Health Management needs related to DM and Pain      Long-Range Goal: Development of Plan of Care to address Health Management needs related to DM and Pain   Start Date: 08/24/2023  Expected End Date: 11/22/2023  Note:   Current Barriers:  Chronic Disease Management support and education needs related to DMII and Pain   RNCM Clinical Goal(s):  Patient will verbalize understanding of plan for management of DMII and Pain as evidenced by Patient reports take all medications exactly as prescribed and will call provider for medication related questions as evidenced by patient reports and EMR attend all scheduled medical appointments: 08/26/23 with Ophthalmology, 08/30/23 with LCSW and 08/31/23 with Pharmacist as evidenced by provider documentation  through collaboration with RN Care manager, provider, and care team.   Interventions: Evaluation of current treatment plan related to  self management and patient's adherence to plan as established by provider Provided patient with [employer] transportation information, advised to call [phone], 2-3 days prior to appointment to schedule transportation   Diabetes Interventions:  (Status:  New goal.) Long Term Goal Assessed patient's understanding of A1c goal: <7% Provided education to patient about basic DM disease process Reviewed medications with patient and discussed importance of medication adherence Counseled on importance of regular laboratory monitoring as prescribed Discussed plans with patient for ongoing care management follow up and provided patient with direct contact information for care management team  Reviewed scheduled/upcoming provider appointments including: [facility] on 08/26/23, 08/30/23 with  LCSW and 08/31/23 with Pharmacist Review of patient status, including review of consultants reports, relevant laboratory and other test results, and medications completed Assessed social determinant of health barriers Provided patient with information to [facility] at [facility] [phone], advised to contact regarding charge account for needed prescriptions Reviewed member benefits provided by [employer], advised patient to call member services [phone] to inquire Lab Results  Component Value Date   HGBA1C 9.9 (H) 08/19/2023    Pain Interventions:  (Status:  New goal.) Long Term Goal Pain assessment performed Medications reviewed Reviewed provider established plan for pain management Discussed importance of adherence to all scheduled medical appointments Counseled on the importance of reporting any/all new or changed pain symptoms or management strategies to pain management provider Advised patient to report to care team affect of pain on daily activities Discussed use of relaxation techniques and/or diversional activities to assist with pain reduction (distraction, imagery, relaxation, massage, acupressure, TENS, heat, and cold application Assessed social determinant of health barriers  Patient Goals/Self-Care Activities: Take all medications as prescribed Attend all scheduled provider appointments Call provider office for new concerns or questions  keep appointment with eye doctor drink 6 to 8 glasses of water each day fill half of plate with vegetables manage portion size Include a lean protein with each meal/snack Cut out soda and candy  Follow Up Plan:  Telephone follow up appointment with care management team member scheduled for:  09/23/23 at 9am

## 2023-08-26 LAB — HM DIABETES EYE EXAM

## 2023-08-30 ENCOUNTER — Other Ambulatory Visit (INDEPENDENT_AMBULATORY_CARE_PROVIDER_SITE_OTHER): Payer: Self-pay

## 2023-08-30 ENCOUNTER — Other Ambulatory Visit: Payer: Self-pay | Admitting: Licensed Clinical Social Worker

## 2023-08-30 NOTE — Patient Outreach (Signed)
 Medicaid Managed Care Social Work Note  08/30/2023 Name:  Karl Barrett MRN:  [id number] DOB:  04-13-1971  Karl Barrett is an 53 y.o. year old male who is a primary patient of Donzella Lauraine SAILOR, DO.  The Medicaid Managed Care Coordination team was consulted for assistance with:  Community Resources  Mental Health Counseling and Resources  Karl Barrett was given information about Medicaid Managed Care Coordination team services today. Karl Barrett Patient agreed to services and verbal consent obtained.  Engaged with patient  for by telephone forinitial visit in response to referral for case management and/or care coordination services.   Patient is participating in a Managed Medicaid Plan:  Yes  Assessments/Interventions:  Review of past medical history, allergies, medications, health status, including review of consultants reports, laboratory and other test data, was performed as part of comprehensive evaluation and provision of chronic care management services.  SDOH: (Social Drivers of Health) assessments and interventions performed: SDOH Interventions    Flowsheet Row Patient Outreach Telephone from 08/30/2023 in [place] POPULATION HEALTH DEPARTMENT Telephone from 08/23/2023 in [place] HEALTH POPULATION HEALTH DEPARTMENT Office Visit from 05/30/2021 in [facility] Primary Care & Sports Medicine at [facility] Office Visit from 04/20/2019 in [facility] Primary Care & Sports Medicine at [facility] Office Visit from 02/22/2019 in [facility] Primary Care & Sports Medicine at [facility]  SDOH Interventions       Food Insecurity Interventions -- [id number] Referral, Community Resources Provided -- -- --  Housing Interventions -- [employer] Provided, Other (Comment)  [has lot rent and power and cable .] -- -- --  Development Worker, Community Provided, Emergency Planning/management Officer Provided, [employer] ([employer])  Psychologist, Forensic  information provided] -- -- --  Utilities Interventions -- Intervention Not Indicated  [Told about Low income energy assitance program would not be helpful as utilities are not in his] -- -- --  Depression Interventions/Treatment  [employer] Provided -- Counseling Medication Medication  Financial Strain Interventions [employer] Provided -- -- -- --  Stress Interventions [employer] Provided -- -- -- --       Advanced Directives Status:  See Care Plan for related entries.  Care Plan                 No Known Allergies  Medications Reviewed Today   Medications were not reviewed in this encounter     Patient Active Problem List   Diagnosis Date Noted   Primary osteoarthritis involving multiple joints 08/19/2023   Mood disorder (HCC) 08/19/2023   Hypertension associated with diabetes (HCC) 08/19/2023   Uncontrolled type 2 diabetes mellitus with hyperglycemia (HCC) 08/19/2023   Type 2 diabetes mellitus with diabetic neuropathy, without long-term current use of insulin (HCC) 08/19/2023   Tobacco use disorder, severe, dependence 08/19/2023   Pulmonary emphysema (HCC) 08/19/2023   Encounter for screening prostate specific antigen (PSA) measurement 08/19/2023   Annual physical exam 08/19/2023   History of colon polyps 08/19/2023   Encounter for screening for HIV 08/19/2023   Encounter for hepatitis C screening test for low risk patient 08/19/2023   Cutaneous abscess of abdominal wall 08/19/2023   Hyperlipidemia associated with type 2 diabetes mellitus (HCC) 09/16/2017   Barrett's esophagus with dysplasia 02/19/2015    Conditions to be addressed/monitored per PCP order:  Depression  Care Plan : LCSW Plan of Care  Updates made by Merlynn Lyle CROME, LCSW since 08/30/2023 12:00 AM     Problem: Depression  Identification (Depression)      Goal: Depressive Symptoms Identified   Start Date: 08/30/2023  Note:    Timeframe:  Short-Range Goal Priority:  High Start  Date:  08/30/23            Expected End Date:  ongoing                     Follow Up Date-- 09/20/23 at 1 pm  - keep 90 percent of scheduled appointments -consider counseling or psychiatry -consider bumping up your self-care  -consider creating a stronger support network   Why is this important?             Combating depression may take some time.            If you don't feel better right away, don't give up on your treatment plan.    Current barriers:   Chronic Mental Health needs related to symptoms of depression, stress and possible cognitive concerns with processing new information. Patient also suspects that he has ADHD. Patient requires Support, Education, Resources, Referrals, Advocacy, and Care Coordination, in order to meet Unmet Mental Health Needs and to find a therapist and psychiatrist. SDOH barriers: navigating the internet, lack of food resources, unable to keep up with appointments and afford bills.  Patient will implement clinical interventions discussed today to decrease symptoms of depression and increase knowledge and/or ability of: coping skills. Mental Health Concerns and Social Isolation Patient lacks knowledge of available community counseling agencies and resources.  Clinical Goal(s): verbalize understanding of plan for management of Depression, and Stress symptoms and demonstrate a reduction in symptoms. Patient will connect with a provider for ongoing mental health treatment, increase coping skills, healthy habits, self-management skills, and stress reduction        Clinical Interventions:  Assessed patient's previous and current treatment, coping skills, support system and barriers to care. Patient provided hx  Verbalization of feelings encouraged, motivational interviewing employed Emotional support provided, positive coping strategies explored. Establishing healthy boundaries emphasized and healthy self-care education provided Patient was educated on available  mental health resources within their area that accept Medicaid and offer counseling and psychiatry. Patient was advised to contact the back of his insurance cards for assistance with benefits as well. Provided patient with [place] [employer] transportation information, advised to call [phone], 2-3 days prior to appointments to schedule transportation. Patient has successfully started [employer] to help him with navigating the internet and getting comfortable with using computers. Patient educated on the difference between therapy and psychiatry per patient request Email sent to patient today with available mental health resources within his area that accept Medicaid and offer the services that she is interested in. Email included instructions for scheduling at [facility] as well as some crisis support resources and [facility]'s walk in clinic hours. Patient will review resources over the next two weeks and make a decision regarding where he wishes to gain MH treatment at. Emotional support provided. CBT intervention implemented regarding being mentally fit by combating negative thinking and replacing it with uplifting support, hope and positivity.. Patient reports significant worsening mood impacting their ability to function appropriately and carry out daily task. Assessed social determinant of health barriers Discussed use of relaxation techniques and/or diversional activities to assist with pain reduction (distraction, imagery, relaxation, massage, acupressure, TENS, heat, and cold application; Reviewed with patient prescribed pharmacological and nonpharmacological pain relief strategies; Patient receives strong support from daughter and nephew.  LCSW provided education on relaxation techniques  such as meditation, deep breathing, massage, grounding exercises or yoga that can activate the body's relaxation response and ease symptoms of stress and anxiety. LCSW ask that when pt is struggling  with difficult emotions and racing thoughts that they start this relaxation response process. LCSW provided extensive education on healthy coping skills for anxiety. SW used active and reflective listening, validated patient's feelings/concerns, and provided emotional support. Patient will work on implementing appropriate self-care habits into their daily routine such as: staying positive, writing a gratitude list, drinking water, staying active around the house, taking their medications as prescribed, combating negative thoughts or emotions and staying connected with their family and friends. Positive reinforcement provided for this decision to work on this.  Motivational Interviewing employed Depression screen reviewed  PHQ2/ PHQ9 completed or reviewed  Mindfulness or Relaxation training provided Active listening / Reflection utilized  Advance Care and HCPOA education provided Emotional Support Provided Problem Solving /Task Center strategies reviewed Provided psychoeducation for mental health needs  Provided brief CBT  Reviewed mental health medications and discussed importance of compliance:  Quality of sleep assessed & Sleep Hygiene techniques promoted  Participation in counseling encouraged  Verbalization of feelings encouraged  Suicidal Ideation/Homicidal Ideation assessed: Patient denies SI/HI  Review resources, discussed options and provided patient information about  Mental Health Resources Inter-disciplinary care team collaboration (see longitudinal plan of care) [facility] LCSW made referral for [facility] BSW involvement per patient's request for financial resource support regarding phone bill, utility, food and financial support and resource connection.   Patient Goals/Self-Care Activities: Take medications as prescribed   Attend all scheduled provider appointments Call pharmacy for medication refills 3-7 days in advance of running out of medications Perform all self care activities  independently  Perform IADL's (shopping, preparing meals, housekeeping, managing finances) independently Call provider office for new concerns or questions Work with the social worker to address care coordination needs and will continue to work with the clinical team to address health care and disease management related needs call 1-800-273-TALK (toll free, 24 hour hotline) If in a crisis, go to [facility] [address], [place] [phone]) call 911 if experiencing a Mental Health or Behavioral Health Crisis  Utilize healthy coping skills and supportive resources discussed Contact PCP with any questions or concerns Keep 90 percent of counseling appointments Call your insurance provider for more information about your Enhanced Benefits  Check out counseling resources provided  Begin personal counseling with LCSW, to reduce and manage symptoms of Depression and Stress, until well-established with mental health provider Accept all calls from representative with Behavioral Health Providers in an effort to establish ongoing mental health counseling and supportive services. Incorporate into daily practice - relaxation techniques, deep breathing exercises, and mindfulness meditation strategies. Talk about feelings with friends, family members, spiritual advisor, etc. Contact LCSW directly [phone]), if you have questions, need assistance, or if additional social work needs are identified between now and our next scheduled telephone outreach call. Call 988 for mental health hotline/crisis line if needed (24/7 available) Try techniques to reduce symptoms of anxiety/negative thinking (deep breathing, distraction, positive self talk, etc)  - develop a personal safety plan - develop a plan to deal with triggers like holidays, anniversaries - exercise at least 2 to 3 times per week - have a plan for how to handle bad days - journal feelings and what helps  to feel better or worse - spend time or talk with others at least 2 to 3 times per  week - watch for early signs of feeling worse - begin personal counseling - call and visit an old friend - check out volunteer opportunities - join a support group - laugh; watch a funny movie or comedian - learn and use visualization or guided imagery - perform a random act of kindness - practice relaxation or meditation daily - start or continue a personal journal - practice positive thinking and self-talk -continue with compliance of taking medication  -identify current effective and ineffective coping strategies.  -implement positive self-talk in care to increase self-esteem, confidence and feelings of control.  -consider alternative and complementary therapy approaches such as meditation, mindfulness or yoga.  Call your insurance provider to gain education on benefits if desired Call your primary care doctor if symptoms get worse  -journaling, prayer, worship services, meditation or pastoral counseling.  -increase participation in pleasurable group activities such as hobbies, singing, sports or volunteering).  -consider the use of meditative movement therapy such as tai chi, yoga or qigong.  -start a regular daily exercise program based on tolerance, ability and patient choice to support positive thinking and activity    Follow Up Plan:  The patient has been provided with contact information for the care management team and has been advised to call with any mental health or health related questions or concerns.  The care management team will reach out to the patient again over the next 30 business  days.   If you are experiencing a Mental Health or Behavioral Health Crisis or need someone to talk to, please call the Suicide and Crisis Lifeline: 988    Patient Goals: Initial goal     Follow up:  Patient agrees to Care Plan and Follow-up.  Plan: The Managed Medicaid care management team will reach  out to the patient again over the next 30 days.  Lyle Rung, BSW, MSW, LCSW Licensed Clinical Social Worker [employer]   [facility] [email or web address] Direct Dial: [phone]

## 2023-08-30 NOTE — Patient Instructions (Signed)
 Visit Information  Karl Barrett was given information about Medicaid Managed Care team care coordination services as a part of their [place] benefit. Karl Barrett verbally consented to engagement with the [facility] Managed Care team.   If you are experiencing a medical emergency, please call 911 or report to your local emergency department or urgent care.   If you have a non-emergency medical problem during routine business hours, please contact your provider's office and ask to speak with a nurse.   For questions related to your [place] health plan, please call: [phone]  If you would like to schedule transportation through your [place] plan, please call the following number at least 2 days in advance of your appointment: [phone].   There is no limit to the number of trips during the year between medical appointments, healthcare facilities, or pharmacies. Transportation must be scheduled at least 2 business days before but not more than thirty 30 days before of your appointment.  Call the Behavioral Health Crisis Line at [phone], at any time, 24 hours a day, 7 days a week. If you are in danger or need immediate medical attention call 911.  If you would like help to quit smoking, call 1-800-QUIT-NOW ((813)482-7617) OR Espaol: 1-855-Djelo-Ya (8-144-664-6430) o para ms informacin haga clic aqu or Text READY to 799-599 to register via text  Following is a copy of your plan of care:  Care Plan : LCSW Plan of Care  Updates made by Karl Lyle CROME, LCSW since 08/30/2023 12:00 AM     Problem: Depression Identification (Depression)      Goal: Depressive Symptoms Identified   Start Date: 08/30/2023  Note:    Timeframe:  Short-Range Goal Priority:  High Start Date:  08/30/23            Expected End Date:  ongoing                     Follow Up Date-- 09/20/23 at 1 pm  - keep 90 percent of scheduled appointments -consider  counseling or psychiatry -consider bumping up your self-care  -consider creating a stronger support network   Why is this important?             Combating depression may take some time.            If you don't feel better right away, don't give up on your treatment plan.    Current barriers:   Chronic Mental Health needs related to symptoms of depression, stress and possible cognitive concerns with processing new information. Patient also suspects that he has ADHD. Patient requires Support, Education, Resources, Referrals, Advocacy, and Care Coordination, in order to meet Unmet Mental Health Needs and to find a therapist and psychiatrist. SDOH barriers: navigating the internet, lack of food resources, unable to keep up with appointments and afford bills.  Patient will implement clinical interventions discussed today to decrease symptoms of depression and increase knowledge and/or ability of: coping skills. Mental Health Concerns and Social Isolation Patient lacks knowledge of available community counseling agencies and resources.  Clinical Goal(s): verbalize understanding of plan for management of Depression, and Stress symptoms and demonstrate a reduction in symptoms. Patient will connect with a provider for ongoing mental health treatment, increase coping skills, healthy habits, self-management skills, and stress reduction        Patient Goals/Self-Care Activities: Take medications as prescribed   Attend all scheduled provider appointments Call pharmacy for medication refills 3-7  days in advance of running out of medications Perform all self care activities independently  Perform IADL's (shopping, preparing meals, housekeeping, managing finances) independently Call provider office for new concerns or questions Work with the social worker to address care coordination needs and will continue to work with the clinical team to address health care and disease management related needs call  1-800-273-TALK (toll free, 24 hour hotline) If in a crisis, go to [facility] [address], [place] [phone]) call 911 if experiencing a Mental Health or Behavioral Health Crisis  Utilize healthy coping skills and supportive resources discussed Contact PCP with any questions or concerns Keep 90 percent of counseling appointments Call your insurance provider for more information about your Enhanced Benefits  Check out counseling resources provided  Begin personal counseling with LCSW, to reduce and manage symptoms of Depression and Stress, until well-established with mental health provider Accept all calls from representative with Behavioral Health Providers in an effort to establish ongoing mental health counseling and supportive services. Incorporate into daily practice - relaxation techniques, deep breathing exercises, and mindfulness meditation strategies. Talk about feelings with friends, family members, spiritual advisor, etc. Contact LCSW directly [phone]), if you have questions, need assistance, or if additional social work needs are identified between now and our next scheduled telephone outreach call. Call 988 for mental health hotline/crisis line if needed (24/7 available) Try techniques to reduce symptoms of anxiety/negative thinking (deep breathing, distraction, positive self talk, etc)  - develop a personal safety plan - develop a plan to deal with triggers like holidays, anniversaries - exercise at least 2 to 3 times per week - have a plan for how to handle bad days - journal feelings and what helps to feel better or worse - spend time or talk with others at least 2 to 3 times per week - watch for early signs of feeling worse - begin personal counseling - call and visit an old friend - check out volunteer opportunities - join a support group - laugh; watch a funny movie or comedian - learn and use visualization or guided  imagery - perform a random act of kindness - practice relaxation or meditation daily - start or continue a personal journal - practice positive thinking and self-talk -continue with compliance of taking medication  -identify current effective and ineffective coping strategies.  -implement positive self-talk in care to increase self-esteem, confidence and feelings of control.  -consider alternative and complementary therapy approaches such as meditation, mindfulness or yoga.  Call your insurance provider to gain education on benefits if desired Call your primary care doctor if symptoms get worse  -journaling, prayer, worship services, meditation or pastoral counseling.  -increase participation in pleasurable group activities such as hobbies, singing, sports or volunteering).  -consider the use of meditative movement therapy such as tai chi, yoga or qigong.  -start a regular daily exercise program based on tolerance, ability and patient choice to support positive thinking and activity    Follow Up Plan:  The patient has been provided with contact information for the care management team and has been advised to call with any mental health or health related questions or concerns.  The care management team will reach out to the patient again over the next 30 business  days.   If you are experiencing a Mental Health or Behavioral Health Crisis or need someone to talk to, please call the Suicide and Crisis Lifeline: 988    Patient Goals: Initial goal  24- Hour Availability:    [facility]  [address] [place], [place] [phone] Crisis [phone]   [facility] [phone]  [facility] Crisis Service  [phone]    [facility]  [phone] (after hours)   Therapeutic Alternative/Mobile Crisis   [phone]   [place]  National Suicide Hotline  786 421 3232 MERRILYN) OR 988   Call 988 for  mental health emergencies   [facility]  (805)125-2815);  [place] and [employer]  6314840976); [place], [place], [place], [employer], [place], [place], [place]    [facility] for [facility] Residents For 24/7 walk-up access to mental health services for [facility] children (4+), adolescents and adults, please visit the [facility] located at [address] in [place], [place].  *[place] also provides comprehensive outpatient behavioral health services in a variety of locations around the [place].  Connect With Us  [address] [place], [place] [address] HelpLine: 5633716066 or 1-380-855-1561  Get Directions  Find Help 24/7 By Phone Call our 24-hour HelpLine at 339-373-8106 or 731-879-5664 for immediate assistance for mental health and substance abuse issues.  Walk-In Help [place]: [facility] (Ages 4 and Up) [place]: Emergency Dept., [facility] Additional Resources National Hopeline Network: 1-800-SUICIDE The National Suicide Prevention Lifeline: 1-800-273-TALK     The following coping skill education was provided for stress relief and mental health management: When your car dies or a deadline looms, how do you respond? Long-term, low-grade or acute stress takes a serious toll on your body and mind, so don't ignore feelings of constant tension. Stress is a natural part of life. However, too much stress can harm our health, especially if it continues every day. This is chronic stress and can put you at risk for heart problems like heart disease and depression. Understand what's happening inside your body and learn simple coping skills to combat the negative impacts of everyday stressors.  Types of Stress There are two types of stress: Emotional - types of emotional stress are relationship problems, pressure at work,  financial worries, experiencing discrimination or having a major life change. Physical - Examples of physical stress include being sick having pain, not sleeping well, recovery from an injury or having an alcohol and drug use disorder. Fight or Flight Sudden or ongoing stress activates your nervous system and floods your bloodstream with adrenaline and cortisol, two hormones that raise blood pressure, increase heart rate and spike blood sugar. These changes pitch your body into a fight or flight response. That enabled our ancestors to outrun saber-toothed tigers, and it's helpful today for situations like dodging a car accident. But most modern chronic stressors, such as finances or a challenging relationship, keep your body in that heightened state, which hurts your health. Effects of Too Much Stress If constantly under stress, most of us  will eventually start to function less well.  Multiple studies link chronic stress to a higher risk of heart disease, stroke, depression, weight gain, memory loss and even premature death, so it's important to recognize the warning signals. Talk to your doctor about ways to manage stress if you're experiencing any of these symptoms: Prolonged periods of poor sleep. Regular, severe headaches. Unexplained weight loss or gain. Feelings of isolation, withdrawal or worthlessness. Constant anger and irritability. Loss of interest in activities. Constant worrying or obsessive thinking. Excessive alcohol or drug use. Inability to  concentrate.  10 Ways to Cope with Chronic Stress It's key to recognize stressful situations as they occur because it allows you to focus on managing how you react. We all need to know when to close our eyes and take a deep breath when we feel tension rising. Use these tips to prevent or reduce chronic stress. 1. Rebalance Work and Home All work and no play? If you're spending too much time at the office, intentionally put more dates in your  calendar to enjoy time for fun, either alone or with others. 2. Get Regular Exercise Moving your body on a regular basis balances the nervous system and increases blood circulation, helping to flush out stress hormones. Even a daily 20-minute walk makes a difference. Any kind of exercise can lower stress and improve your mood ? just pick activities that you enjoy and make it a regular habit. 3. Eat Well and Limit Alcohol and Stimulants Alcohol, nicotine and caffeine may temporarily relieve stress but have negative health impacts and can make stress worse in the long run. Well-nourished bodies cope better, so start with a good breakfast, add more organic fruits and vegetables for a well-balanced diet, avoid processed foods and sugar, try herbal tea and drink more water. 4. Connect with Supportive People Talking face to face with another person releases hormones that reduce stress. Lean on those good listeners in your life. 5. Carve Out Hobby Time Do you enjoy gardening, reading, listening to music or some other creative pursuit? Engage in activities that bring you pleasure and joy; research shows that reduces stress by almost half and lowers your heart rate, too. 6. Practice Meditation, Stress Reduction or Yoga Relaxation techniques activate a state of restfulness that counterbalances your body's fight-or-flight hormones. Even if this also means a 10-minute break in a long day: listen to music, read, go for a walk in nature, do a hobby, take a bath or spend time with a friend. Also consider doing a mindfulness exercise or try a daily deep breathing or imagery practice. Deep Breathing Slow, calm and deep breathing can help you relax. Try these steps to focus on your breathing and repeat as needed. Find a comfortable position and close your eyes. Exhale and drop your shoulders. Breathe in through your nose; fill your lungs and then your belly. Think of relaxing your body, quieting your mind and becoming  calm and peaceful. Breathe out slowly through your nose, relaxing your belly. Think of releasing tension, pain, worries or distress. Repeat steps three and four until you feel relaxed. Imagery This involves using your mind to excite the senses -- sound, vision, smell, taste and feeling. This may help ease your stress. Begin by getting comfortable and then do some slow breathing. Imagine a place you love being at. It could be somewhere from your childhood, somewhere you vacationed or just a place in your imagination. Feel how it is to be in the place you're imagining. Pay attention to the sounds, air, colors, and who is there with you. This is a place where you feel cared for and loved. All is well. You are safe. Take in all the smells, sounds, tastes and feelings. As you do, feel your body being nourished and healed. Feel the calm that surrounds you. Breathe in all the good. Breathe out any discomfort or tension. 7. Sleep Enough If you get less than seven to eight hours of sleep, your body won't tolerate stress as well as it could. If stress keeps you up  at night, address the cause, and add extra meditation into your day to make up for the lost z's. Try to get seven to nine hours of sleep each night. Make a regular bedtime schedule. Keep your room dark and cool. Try to avoid computers, TV, cell phones and tablets before bed. 8. Bond with Connections You Enjoy Go out for a coffee with a friend, chat with a neighbor, call a family member, visit with a clergy member, or even hang out with your pet. Clinical studies show that spending even a short time with a companion animal can cut anxiety levels almost in half. 9. Take a Vacation Getting away from it all can reset your stress tolerance by increasing your mental and emotional outlook, which makes you a happier, more productive person upon return. Leave your cellphone and laptop at home! 10. See a Counselor, Coach or Therapist If negative thoughts  overwhelm your ability to make positive changes, it's time to seek professional help. Make an appointment today--your health and life are worth it.  Lyle Rung, BSW, MSW, LCSW Licensed Clinical Social Worker [employer]   [email or web address] Direct Dial: [phone]

## 2023-08-31 ENCOUNTER — Ambulatory Visit: Payer: Medicaid Other | Admitting: General Surgery

## 2023-08-31 ENCOUNTER — Telehealth: Payer: Self-pay | Admitting: Pharmacist

## 2023-08-31 ENCOUNTER — Other Ambulatory Visit: Payer: Self-pay | Admitting: Pharmacist

## 2023-08-31 NOTE — Progress Notes (Signed)
   08/31/2023  Patient ID: Karl Barrett, male   DOB: [birth year], 53 y.o.   MRN: [id number]  Tried to call from several different phones, but number listed is not an active phone number it appears as the phone will NOT even dial. Says the number is invalid.  Able to reach nephew. See encounter document.   Aloysius Breeding, PharmD [facility] Phone Number: [phone]

## 2023-08-31 NOTE — Progress Notes (Addendum)
 08/31/2023 Name: Edson Deridder MRN: [id number] DOB: August 24, 1971  Chief Complaint  Patient presents with   Diabetes   Medication Management    Agustine Rossitto is a 53 y.o. year old male who presented for a telephone visit.   They were referred to the pharmacist by their PCP for assistance in managing diabetes and complex medication management.    Subjective:  Care Team: Primary Care Provider: Donzella Lauraine SAILOR, DO ; Next Scheduled Visit: 11/17/23 Clinical Pharmacist: Aloysius Breeding, PharmD  Medication Access/Adherence  [phone] [address], [place] - 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 Patient denies hyperglycemic symptoms including polyuria, polydipsia, polyphagia, nocturia, neuropathy, blurred vision.  Current meal patterns:  - Breakfast: Frozen sausage biscuits, cereal - Lunch: Fast food - Supper: Meat + potatoes/corn, lasagna  - Snacks: Little Debbie, pudding - Drinks: Soda (Dr. Nunzio or [place]), sweet tea  **Has good understanding of healthy food and   Current physical activity: Not a lot-  was doing a manual labor job  Current medication access support: Medicaid only   Objective:  Lab Results  Component Value Date   HGBA1C 9.9 (H) 08/19/2023    Lab Results  Component Value Date   CREATININE 0.82 08/19/2023   BUN 16 08/19/2023   NA 138 08/19/2023   K 4.7 08/19/2023   CL 101 08/19/2023   CO2 23 08/19/2023    Lab Results  Component Value Date   CHOL 195 08/19/2023   HDL 45 08/19/2023   LDLCALC 124 (H) 08/19/2023   TRIG 148 08/19/2023   CHOLHDL 4.3 08/19/2023    Medications Reviewed Today     Reviewed by Breeding Aloysius HERO, RPH (Pharmacist) on 08/31/23 at 1020  Med List Status: <None>   Medication Order Taking? Sig Documenting Provider Last Dose Status Informant  albuterol  (PROVENTIL  HFA) 108 (90 Base) MCG/ACT inhaler [id number] Yes Inhale 1-2 puffs into the lungs every 6 (six) hours as needed for wheezing or shortness of breath. Emilio Kelly DASEN, FNP Taking Active   budesonide -formoterol  (SYMBICORT ) 160-4.5 MCG/ACT inhaler [id number] No Inhale 2 puffs into the lungs 2 (two) times daily.  Patient not taking: Reported on 08/31/2023   Emilio Kelly DASEN, FNP Not Taking Active   empagliflozin  (JARDIANCE ) 25 MG TABS tablet [id number] No Take 1 tablet (25 mg total) by mouth daily.  Patient not taking: Reported on 08/31/2023   Emilio Kelly DASEN, FNP Not Taking Active   escitalopram  (LEXAPRO ) 10 MG tablet [id number] Yes Take 1 tablet (10 mg total) by mouth at bedtime. Emilio Kelly DASEN, FNP Taking Active   glipiZIDE  (GLUCOTROL  XL) 10 MG 24 hr tablet [id number] Yes Take 1 tablet (10 mg total) by mouth daily with  breakfast. Emilio Kelly DASEN, FNP Taking Active   lisinopril  (ZESTRIL ) 5 MG tablet [id number] Yes Take 1 tablet (5 mg total) by mouth daily. Emilio Kelly DASEN, FNP Taking Active   rosuvastatin  (CRESTOR ) 40 MG tablet [id number] Yes Take 1 tablet (40 mg total) by mouth daily. Emilio Kelly DASEN, FNP Taking Active   sulfamethoxazole -trimethoprim  (BACTRIM  DS) 800-160 MG tablet [id number] Yes Take 1  tablet by mouth 2 (two) times daily. Emilio Kelly DASEN, FNP Taking Active               Assessment/Plan:   Diabetes: - Currently uncontrolled - Reviewed long term cardiovascular and renal outcomes of uncontrolled blood sugar - Reviewed goal A1c, goal fasting, and goal 2 hour post prandial glucose - Reviewed dietary modifications including cutting out sodas and sweets with snacks first - Reviewed lifestyle modifications including adding more exercise where possible   Follow Up Plan:  - Follow-up on 2/10 to see how he is doing with new medications - Advised to use [facility] for [facility] charge account if needed- had to borrow money this month - $290 for monthly of food- provided some healthy and cheap options (canned vegetables, tuna, chicken, etc) - Send scripts for meter + strips + lancets to pharmacy at next call - [facility]- Symbicort  is $4 (had to change to brand) and Jardiance  needs a PA submitted  - Submitted through CoverMyMeds on 08/31/23, but may have to send to PA dept if needed - Will be getting new phone number soon- sharing phone with nephew Garen currently and added to chart  *Of note, patient recently lost his job and has no money- trying to organize appts and resources currently  Update from 09/01/23:  - Patient had a new phone number of [phone]  - Told him that Jardiance  was approved for $4 at the pharmacy  Aloysius Breeding, PharmD [facility] Phone Number: [phone]

## 2023-09-07 ENCOUNTER — Encounter: Payer: Self-pay | Admitting: General Surgery

## 2023-09-07 ENCOUNTER — Ambulatory Visit (INDEPENDENT_AMBULATORY_CARE_PROVIDER_SITE_OTHER): Payer: Medicaid Other | Admitting: General Surgery

## 2023-09-07 VITALS — BP 120/76 | HR 67 | Temp 98.0°F | Ht 73.0 in | Wt 215.0 lb

## 2023-09-07 DIAGNOSIS — L723 Sebaceous cyst: Secondary | ICD-10-CM

## 2023-09-07 NOTE — Progress Notes (Signed)
 Patient ID: Karl Barrett, male   DOB: 02-22-1971, 53 y.o.   MRN: [id number] CC: Left Flank Cyst History of Present Illness Karl Barrett is a 53 y.o. male with past medical history significant for diabetes mellitus who presents in consultation for left flank cyst.  Patient reports that he has had several cyst throughout his body.  He says over the last several weeks he noticed a painful lump just above his left buttock.  He said initially this got hard and was red.  He tried to squeeze it and he had these looking material expressed from the wound.  Since then he says that it has been healing up.  He denies any fevers or chills.  He did go to his primary care doctor and received antibiotics for this..  Past Medical History Past Medical History:  Diagnosis Date   Barrett esophagus    Depression    Hyperlipidemia    Prediabetes        Past Surgical History:  Procedure Laterality Date   COLONOSCOPY  2008   COLONOSCOPY  02/02/2018   Tubular adenoma   ESOPHAGOGASTRODUODENOSCOPY  2010   ESOPHAGOGASTRODUODENOSCOPY  02/02/2018   no dysplasia, no Barrett   LITHOTRIPSY      Allergies  Allergen Reactions   Nsaids     Barrett Esophagus     Current Outpatient Medications  Medication Sig Dispense Refill   albuterol  (PROVENTIL  HFA) 108 (90 Base) MCG/ACT inhaler Inhale 1-2 puffs into the lungs every 6 (six) hours as needed for wheezing or shortness of breath. 18 g 0   escitalopram  (LEXAPRO ) 10 MG tablet Take 1 tablet (10 mg total) by mouth at bedtime. 90 tablet 1   glipiZIDE  (GLUCOTROL  XL) 10 MG 24 hr tablet Take 1 tablet (10 mg total) by mouth daily with breakfast. 90 tablet 0   lisinopril  (ZESTRIL ) 5 MG tablet Take 1 tablet (5 mg total) by mouth daily. 90 tablet 3   rosuvastatin  (CRESTOR ) 40 MG tablet Take 1 tablet (40 mg total) by mouth daily. 90 tablet 3   budesonide -formoterol  (SYMBICORT ) 160-4.5 MCG/ACT inhaler Inhale 2 puffs into the lungs 2 (two) times daily. (Patient not taking:  Reported on 08/24/2023) 10.2 g 11   empagliflozin  (JARDIANCE ) 25 MG TABS tablet Take 1 tablet (25 mg total) by mouth daily. (Patient not taking: Reported on 08/24/2023) 90 tablet 0   No current facility-administered medications for this visit.    Family History Family History  Problem Relation Age of Onset   Hypertension Mother    Heart disease Mother    Thyroid disease Mother    Pulmonary fibrosis Mother    Heart disease Father    Diabetes Father    Cancer Maternal Grandfather        pancreatic       Social History Social History   Tobacco Use   Smoking status: Every Day    Current packs/day: 1.25    Average packs/day: 1.3 packs/day for 28.0 years (35.0 ttl pk-yrs)    Types: Cigarettes    Passive exposure: Past   Smokeless tobacco: Never  Vaping Use   Vaping status: Former  Substance Use Topics   Alcohol use: Not Currently   Drug use: No        ROS Full ROS of systems performed and is otherwise negative there than what is stated in the HPI  Physical Exam Blood pressure 120/76, pulse 67, temperature 98 F (36.7 C), height 6' 1 (1.854 m), weight 215 lb (97.5 kg),  SpO2 97%. No acute distress, normal work of breathing on room air, regular rate and rhythm, PERRLA, moving all extremities spontaneously, left lateral lower back there is a small area of reactive erythema with a punctate opening of the skin.  I was unable to express any sebum from this but there does feel like there is an underlying cyst.  There is no drainage and no cellulitic changes.  Data Reviewed I reviewed the patient's last labs including a hemoglobin A1c of 9.9 and sugars in the 300s.  I have personally reviewed the patient's imaging and medical records.    Assessment/Plan    The patient is a 53 year old with a left flank mass that is likely a sebaceous cyst given he is describing sebum that is able to be expressed from the wound.  This likely got infected but there is no evidence of infection at  this time.  I did discuss with the patient that if he has recurrent infection of this area that we could excise the cyst.  However, he is an uncontrolled diabetic with an A1c of 9.9 and is also a current smoker.  I discussed with him that ideally I would like his A1c to be under 8 but I would consider if it is under 9 to excise the cyst because with its current level he would almost surely have wound problems.  I also told him that if he does get an infection of the cyst I am happy to perform an I&D no matter what his A1c is.  He just started on a new medication for his diabetes and is scheduled to follow-up with PCP in 3 months.  At that time if he has a repeat of his A1c and is improved then we will see him and schedule cyst removal       Jayson MALVA Endow 09/07/2023, 1:25 PM

## 2023-09-07 NOTE — Patient Instructions (Addendum)
 Follow up with [place] in 3 months.   Call and let us know if this area becomes inflamed again.

## 2023-09-08 ENCOUNTER — Ambulatory Visit (INDEPENDENT_AMBULATORY_CARE_PROVIDER_SITE_OTHER): Payer: Medicaid Other

## 2023-09-08 DIAGNOSIS — F17200 Nicotine dependence, unspecified, uncomplicated: Secondary | ICD-10-CM | POA: Diagnosis not present

## 2023-09-16 ENCOUNTER — Other Ambulatory Visit: Payer: Self-pay

## 2023-09-16 NOTE — Patient Instructions (Signed)
Visit Information  Karl Barrett was given information about Medicaid Managed Care team care coordination services as a part of their [place] Medicaid benefit. [name] verbally consented to engagement with the [facility] Managed Care team.   If you are experiencing a medical emergency, please call 911 or report to your local emergency department or urgent care.   If you have a non-emergency medical problem during routine business hours, please contact your provider's office and ask to speak with a nurse.   For questions related to your [place] health plan, please call: [phone]  If you would like to schedule transportation through your [place] plan, please call the following number at least 2 days in advance of your appointment: [phone].   There is no limit to the number of trips during the year between medical appointments, healthcare facilities, or pharmacies. Transportation must be scheduled at least 2 business days before but not more than thirty 30 days before of your appointment.  Call the Behavioral Health Crisis Line at [phone], at any time, 24 hours a day, 7 days a week. If you are in danger or need immediate medical attention call 911.  If you would like help to quit smoking, call 1-800-QUIT-NOW (651-648-1849) OR Espaol: 1-855-Djelo-Ya (5-188-416-6063) o para ms informacin haga clic aqu or Text READY to 016-010 to register via text  [name] - following are the goals we discussed in your visit today:     Social Worker will follow up in 30 days.   Gus Puma, Kenard Gower, MHA [facility]  Managed Medicaid Social Worker [phone]   Following is a copy of your plan of care:  There are no care plans that you recently modified to display for this patient.

## 2023-09-16 NOTE — Patient Outreach (Signed)
Medicaid Managed Care Social Work Note  09/16/2023 Name:  Karl Barrett MRN:  [id number] DOB:  [birth year]  Karl Barrett is an 53 y.o. year old male who is a primary patient of [name], DO.  The [facility] Managed Care Coordination team was consulted for assistance with:  Community Resources   Karl Barrett was given information about Medicaid Managed Care Coordination team services today. Karl Barrett Patient agreed to services and verbal consent obtained.  Engaged with patient  for by telephone forinitial visit in response to referral for case management and/or care coordination services.   Patient is participating in a Managed Medicaid Plan:  Yes  Assessments/Interventions:  Review of past medical history, allergies, medications, health status, including review of consultants reports, laboratory and other test data, was performed as part of comprehensive evaluation and provision of chronic care management services.  SDOH: (Social Drivers of Health) assessments and interventions performed: SDOH Interventions    Flowsheet Row Patient Outreach Telephone from 08/30/2023 in [place] POPULATION HEALTH DEPARTMENT Telephone from 08/23/2023 in [place] POPULATION HEALTH DEPARTMENT Office Visit from 05/30/2021 in [facility] Primary Care & Sports Medicine at [facility] Office Visit from 04/20/2019 in [facility] Care & Sports Medicine at [facility] Office Visit from 02/22/2019 in [facility] Primary Care & Sports Medicine at [facility]  SDOH Interventions       Food Insecurity Interventions -- [id number] Referral, Community Resources Provided -- -- --  Housing Interventions -- [employer] Provided, Other (Comment)  [has lot rent and power and cable .] -- -- --  Development worker, community Provided, Emergency planning/management officer Provided, [employer] ([employer])  Psychologist, forensic information provided] -- -- --   Utilities Interventions -- Intervention Not Indicated  [Told about Low income energy assitance program would not be helpful as utilities are not in his] -- -- --  Depression Interventions/Treatment  [employer] Provided -- Counseling Medication Medication  Financial Strain Interventions [employer] Provided -- -- -- --  Stress Interventions [employer] Provided -- -- -- --      BSW completed a telephone outreach with patient, he states he is currently unemployed and does not have any income. Patient states he does receive foodstamps and is familiar with the food pantries in the area. Patient states he does need assistance with lot rent and utilities. Patient states he is already connected with vocational rehab. BSW and patient agreed for resources to be mailed.  Advanced Directives Status:  Not addressed in this encounter.  Care Plan                 Allergies  Allergen Reactions   Nsaids     Barrett Esophagus     Medications Reviewed Today   Medications were not reviewed in this encounter     Patient Active Problem List   Diagnosis Date Noted   Primary osteoarthritis involving multiple joints 08/19/2023   Mood disorder (HCC) 08/19/2023   Hypertension associated with diabetes (HCC) 08/19/2023   Uncontrolled type 2 diabetes mellitus with hyperglycemia (HCC) 08/19/2023   Type 2 diabetes mellitus with diabetic neuropathy, without long-term current use of insulin (HCC) 08/19/2023   Tobacco use disorder, severe, dependence 08/19/2023   Pulmonary emphysema (HCC) 08/19/2023   Encounter for screening prostate specific antigen (PSA) measurement 08/19/2023   Annual physical exam 08/19/2023   History of colon polyps 08/19/2023   Encounter for screening for HIV 08/19/2023   Encounter for hepatitis C  screening test for low risk patient 08/19/2023   Cutaneous abscess of abdominal wall 08/19/2023   Hyperlipidemia associated with type 2 diabetes mellitus (HCC)  09/16/2017   Barrett's esophagus with dysplasia 02/19/2015    Conditions to be addressed/monitored per PCP order:   community resources  There are no care plans that you recently modified to display for this patient.   Follow up:  Patient agrees to Care Plan and Follow-up.  Plan: The Managed Medicaid care management team will reach out to the patient again over the next 30 days.  Date/time of next scheduled Social Work care management/care coordination outreach:  10/19/23  Karl Barrett, Karl Barrett, [facility]  Managed [facility] Social Worker [phone]

## 2023-09-20 ENCOUNTER — Telehealth: Payer: Self-pay | Admitting: Family Medicine

## 2023-09-20 ENCOUNTER — Other Ambulatory Visit: Payer: Self-pay

## 2023-09-20 ENCOUNTER — Other Ambulatory Visit: Payer: Self-pay | Admitting: Licensed Clinical Social Worker

## 2023-09-20 DIAGNOSIS — J439 Emphysema, unspecified: Secondary | ICD-10-CM

## 2023-09-20 DIAGNOSIS — F17200 Nicotine dependence, unspecified, uncomplicated: Secondary | ICD-10-CM

## 2023-09-20 MED ORDER — ALBUTEROL SULFATE HFA 108 (90 BASE) MCG/ACT IN AERS: 1.0000 | INHALATION_SPRAY | Freq: Four times a day (QID) | RESPIRATORY_TRACT | 0 refills | Status: DC | PRN

## 2023-09-20 NOTE — Telephone Encounter (Signed)
Prescription already sent via separate encounter.

## 2023-09-20 NOTE — Telephone Encounter (Signed)
[facility] pharmacy request to fill new prescription VENTOLIN HFA 90 MCG inhaler Please advise

## 2023-09-20 NOTE — Patient Outreach (Signed)
Medicaid Managed Care Social Work Note  09/20/2023 Name:  Karl Barrett MRN:  [id number] DOB:  [birth year]  Karl Barrett is an 53 y.o. year old male who is a primary patient of Karl Hay, DO.  The Medicaid Managed Care Coordination team was consulted for assistance with:  Mental Health Counseling and Resources  Karl Barrett was given information about Medicaid Managed Care Coordination team services today. Karl Barrett Patient agreed to services and verbal consent obtained.  Engaged with patient  for by telephone forfollow up visit in response to referral for case management and/or care coordination services.   Patient is participating in a Managed Medicaid Plan:  Yes  Assessments/Interventions:  Review of past medical history, allergies, medications, health status, including review of consultants reports, laboratory and other test data, was performed as part of comprehensive evaluation and provision of chronic care management services.  SDOH: (Social Drivers of Health) assessments and interventions performed: SDOH Interventions    Flowsheet Row Patient Outreach Telephone from 09/20/2023 in [place] POPULATION HEALTH DEPARTMENT Patient Outreach Telephone from 08/30/2023 in [place] POPULATION HEALTH DEPARTMENT Telephone from 08/23/2023 in [place] POPULATION HEALTH DEPARTMENT Office Visit from 05/30/2021 in [facility] Primary Care & [facility] Medicine at [facility] Office Visit from 04/20/2019 in [facility] Primary Care & Sports Medicine at [facility] Office Visit from 02/22/2019 in [facility] Primary Care & Sports Medicine at [facility]  SDOH Interventions        Food Insecurity Interventions -- -- Karl Barrett Referral, Community Resources Provided -- -- --  Housing Interventions -- -- [employer] Provided, Other (Comment)  [has lot rent and power and cable .] -- -- --  Transportation Interventions -- Programmer, applications Provided, Oncologist Provided, [employer] (Research scientist (life sciences))  Psychologist, forensic information provided] -- -- --  Utilities Interventions -- -- Intervention Not Indicated  [Told about Low income energy assitance program would not be helpful as utilities are not in his] -- -- --  Depression Interventions/Treatment  -- [employer] Provided -- Counseling Medication Medication  Financial Strain Interventions -- [employer] Provided -- -- -- --  Stress Interventions [employer] Provided, Education officer, environmental Provided -- -- -- --       Advanced Directives Status:  See Care Plan for related entries.  Care Plan                 Allergies  Allergen Reactions   Nsaids     Barrett Esophagus     Medications Reviewed Today     Reviewed by Karl Bryant, LCSW (Social Worker) on 09/20/23 at 1314  Med List Status: <None>   Medication Order Taking? Sig Documenting Provider Last Dose Status Informant  albuterol (PROVENTIL HFA) 108 (90 Base) MCG/ACT inhaler [id number]  Inhale 1-2 puffs into the lungs every 6 (six) hours as needed for wheezing or shortness of breath. Karl Hay, DO  Active   budesonide-formoterol [facility]) 160-4.5 MCG/ACT inhaler [id number] No Inhale 2 puffs into the lungs 2 (two) times daily.  Patient not taking: Reported on 08/24/2023   Karl Kindle, FNP Not Taking Active   empagliflozin (JARDIANCE) 25 MG TABS tablet [id number] No Take 1 tablet (25 mg total) by mouth daily.  Patient not taking: Reported on 08/24/2023   Karl Kindle, FNP Not Taking Active   escitalopram (LEXAPRO) 10 MG tablet [id number] No Take 1 tablet (10 mg total) by mouth at bedtime. Karl Kindle, FNP  Taking Active   glipiZIDE (GLUCOTROL XL) 10 MG 24 hr tablet [id number] No Take 1 tablet (10 mg total) by mouth daily with breakfast. Karl Kindle, FNP Taking Active   lisinopril (ZESTRIL) 5 MG tablet [id number] No Take 1 tablet (5 mg total) by mouth daily. Karl Kindle,  FNP Taking Active   rosuvastatin (CRESTOR) 40 MG tablet [id number] No Take 1 tablet (40 mg total) by mouth daily. Karl Kindle, FNP Taking Active             Patient Active Problem List   Diagnosis Date Noted   Primary osteoarthritis involving multiple joints 08/19/2023   Mood disorder (HCC) 08/19/2023   Hypertension associated with diabetes (HCC) 08/19/2023   Uncontrolled type 2 diabetes mellitus with hyperglycemia (HCC) 08/19/2023   Type 2 diabetes mellitus with diabetic neuropathy, without long-term current use of insulin (HCC) 08/19/2023   Tobacco use disorder, severe, dependence 08/19/2023   Pulmonary emphysema (HCC) 08/19/2023   Encounter for screening prostate specific antigen (PSA) measurement 08/19/2023   Annual physical exam 08/19/2023   History of colon polyps 08/19/2023   Encounter for screening for HIV 08/19/2023   Encounter for hepatitis C screening test for low risk patient 08/19/2023   Cutaneous abscess of abdominal wall 08/19/2023   Hyperlipidemia associated with type 2 diabetes mellitus (HCC) 09/16/2017   Barrett's esophagus with dysplasia 02/19/2015    Conditions to be addressed/monitored per PCP order:  Depression  Care Plan : LCSW Plan of Care  Updates made by Karl Bryant, LCSW since 09/20/2023 12:00 AM     Problem: Depression Identification (Depression)      Goal: Depressive Symptoms Identified   Start Date: 08/30/2023  Note:    Timeframe:  Short-Range Goal Priority:  High Start Date:  08/30/23            Expected End Date:  ongoing                     Follow Up Date-- 10/21/23 at 1 pm  - keep 90 percent of scheduled appointments -consider counseling or psychiatry -consider bumping up your self-care  -consider creating a stronger support network   Why is this important?             Combating depression may take some time.            If you don't feel better right away, don't give up on your treatment plan.    Current barriers:   Chronic  Mental Health needs related to symptoms of depression, stress and possible cognitive concerns with processing new information. Patient also suspects that he has ADHD. Patient requires Support, Education, Resources, Referrals, Advocacy, and Care Coordination, in order to meet Unmet Mental Health Needs and to find a therapist and psychiatrist. SDOH barriers: navigating the internet, lack of food resources, unable to keep up with appointments and afford bills.  Patient will implement clinical interventions discussed today to decrease symptoms of depression and increase knowledge and/or ability of: coping skills. Mental Health Concerns and Social Isolation Patient lacks knowledge of available community counseling agencies and resources.  Clinical Goal(s): verbalize understanding of plan for management of Depression, and Stress symptoms and demonstrate a reduction in symptoms. Patient will connect with a provider for ongoing mental health treatment, increase coping skills, healthy habits, self-management skills, and stress reduction        Clinical Interventions:  Assessed patient's previous and current treatment, coping skills, support system and barriers to care.  Patient provided hx  Verbalization of feelings encouraged, motivational interviewing employed Emotional support provided, positive coping strategies explored. Establishing healthy boundaries emphasized and healthy self-care education provided Patient was educated on available mental health resources within their area that accept Medicaid and offer counseling and psychiatry. Patient was advised to contact the back of his insurance cards for assistance with benefits as well. Provided patient with [place] [employer] transportation information, advised to call [phone], 2-3 days prior to appointments to schedule transportation. Patient has successfully started [employer] to help him with navigating the internet and getting  comfortable with using computers. Patient educated on the difference between therapy and psychiatry per patient request Email sent to patient today with available mental health resources within his area that accept Medicaid and offer the services that she is interested in. Email included instructions for scheduling at [facility] as well as some crisis support resources and [facility]'s walk in clinic hours. Patient will review resources over the next two weeks and make a decision regarding where he wishes to gain MH treatment at. Emotional support provided. CBT intervention implemented regarding "being mentally fit" by combating negative thinking and replacing it with uplifting support, hope and positivity.. Patient reports significant worsening mood impacting their ability to function appropriately and carry out daily task. Assessed social determinant of health barriers Discussed use of relaxation techniques and/or diversional activities to assist with pain reduction (distraction, imagery, relaxation, massage, acupressure, TENS, heat, and cold application; Reviewed with patient prescribed pharmacological and nonpharmacological pain relief strategies; Patient receives strong support from daughter and nephew.  LCSW provided education on relaxation techniques such as meditation, deep breathing, massage, grounding exercises or yoga that can activate the body's relaxation response and ease symptoms of stress and anxiety. LCSW ask that when pt is struggling with difficult emotions and racing thoughts that they start this relaxation response process. LCSW provided extensive education on healthy coping skills for anxiety. SW used active and reflective listening, validated patient's feelings/concerns, and provided emotional support. Patient will work on implementing appropriate self-care habits into their daily routine such as: staying positive, writing a gratitude list, drinking water, staying active around the house,  taking their medications as prescribed, combating negative thoughts or emotions and staying connected with their family and friends. Positive reinforcement provided for this decision to work on this.  Motivational Interviewing employed Depression screen reviewed  PHQ2/ PHQ9 completed or reviewed  Mindfulness or Relaxation training provided Active listening / Reflection utilized  Advance Care and HCPOA education provided Emotional Support Provided Problem Solving /Task Center strategies reviewed Provided psychoeducation for mental health needs  Provided brief CBT  Reviewed mental health medications and discussed importance of compliance:  Quality of sleep assessed & Sleep Hygiene techniques promoted  Participation in counseling encouraged  Verbalization of feelings encouraged  Suicidal Ideation/Homicidal Ideation assessed: Patient denies SI/HI  Review resources, discussed options and provided patient information about  Mental Health Resources Inter-disciplinary care team collaboration (see longitudinal plan of care) [facility] LCSW made referral for [facility] BSW involvement per patient's request for financial resource support regarding phone bill, utility, food and financial support and resource connection.  09/20/23 update- Patient has successfully been set up for psychiatry with [facility] for next month. He reports still in need of therapy but is having a hard time deciding where he wishes to gain services at. Patient mentioned that her daughter goes to [facility] and he is interested in possibly gaining treatment there. [facility] LCSW provided extensive education on their enrollment process as  well as the services that they provide. Patient shares that he is stable at this time and is aware of his local crisis resources that he can utilize if needed. Brief emotional support provided.   Patient Goals/Self-Care Activities: Take medications as prescribed   Attend all scheduled provider  appointments Call pharmacy for medication refills 3-7 days in advance of running out of medications Perform all self care activities independently  Perform IADL's (shopping, preparing meals, housekeeping, managing finances) independently Call provider office for new concerns or questions Work with the social worker to address care coordination needs and will continue to work with the clinical team to address health care and disease management related needs call 1-800-273-TALK (toll free, 24 hour hotline) If in a crisis, go to [facility] [address] [place] [phone]) call 911 if experiencing a Mental Health or Behavioral Health Crisis  Utilize healthy coping skills and supportive resources discussed Contact PCP with any questions or concerns Keep 90 percent of counseling appointments Call your insurance provider for more information about your Enhanced Benefits  Check out counseling resources provided  Begin personal counseling with LCSW, to reduce and manage symptoms of Depression and Stress, until well-established with mental health provider Accept all calls from representative with Behavioral Health Providers in an effort to establish ongoing mental health counseling and supportive services. Incorporate into daily practice - relaxation techniques, deep breathing exercises, and mindfulness meditation strategies. Talk about feelings with friends, family members, spiritual advisor, etc. Contact LCSW directly [phone]), if you have questions, need assistance, or if additional social work needs are identified between now and our next scheduled telephone outreach call. Call 988 for mental health hotline/crisis line if needed (24/7 available) Try techniques to reduce symptoms of anxiety/negative thinking (deep breathing, distraction, positive self talk, etc)  - develop a personal safety plan - develop a plan to deal with triggers like holidays,  anniversaries - exercise at least 2 to 3 times per week - have a plan for how to handle bad days - journal feelings and what helps to feel better or worse - spend time or talk with others at least 2 to 3 times per week - watch for early signs of feeling worse - begin personal counseling - call and visit an old friend - check out volunteer opportunities - join a support group - laugh; watch a funny movie or comedian - learn and use visualization or guided imagery - perform a random act of kindness - practice relaxation or meditation daily - start or continue a personal journal - practice positive thinking and self-talk -continue with compliance of taking medication  -identify current effective and ineffective coping strategies.  -implement positive self-talk in care to increase self-esteem, confidence and feelings of control.  -consider alternative and complementary therapy approaches such as meditation, mindfulness or yoga.  Call your insurance provider to gain education on benefits if desired Call your primary care doctor if symptoms get worse  -journaling, prayer, worship services, meditation or pastoral counseling.  -increase participation in pleasurable group activities such as hobbies, singing, sports or volunteering).  -consider the use of meditative movement therapy such as tai chi, yoga or qigong.  -start a regular daily exercise program based on tolerance, ability and patient choice to support positive thinking and activity    Follow Up Plan:  The patient has been provided with contact information for the care management team and has been advised to call with any mental health or health related questions  or concerns.  The care management team will reach out to the patient again over the next 30 business  days.   If you are experiencing a Mental Health or Behavioral Health Crisis or need someone to talk to, please call the Suicide and Crisis Lifeline: 988    Patient Goals:  Follow up goal     Follow up:  Patient agrees to Care Plan and Follow-up.  Plan: The Managed Medicaid care management team will reach out to the patient again over the next 30 days.  Dickie La, BSW, MSW, LCSW Licensed Clinical Social Worker [employer]   [facility] [email or web address] Direct Dial: [phone]

## 2023-09-20 NOTE — Patient Instructions (Signed)
Visit Information  Karl Barrett was given information about Medicaid Managed Care team care coordination services as a part of their [place] benefit. Karl Barrett verbally consented to engagement with the [facility] Managed Care team.   If you are experiencing a medical emergency, please call 911 or report to your local emergency department or urgent care.   If you have a non-emergency medical problem during routine business hours, please contact your provider's office and ask to speak with a nurse.   For questions related to your [place] health plan, please call: [phone]  If you would like to schedule transportation through your [place] Medicaid plan, please call the following number at least 2 days in advance of your appointment: [phone].   There is no limit to the number of trips during the year between medical appointments, healthcare facilities, or pharmacies. Transportation must be scheduled at least 2 business days before but not more than thirty 30 days before of your appointment.  Call the Behavioral Health Crisis Line at (440)270-2125, at any time, 24 hours a day, 7 days a week. If you are in danger or need immediate medical attention call 911.  If you would like help to quit smoking, call 1-800-QUIT-NOW (732-554-6913) OR Espaol: 1-855-Djelo-Ya (4-132-440-1027) o para ms informacin haga clic aqu or Text READY to 253-664 to register via text  Following is a copy of your plan of care:  Care Plan : LCSW Plan of Care  Updates made by [name], LCSW since 09/20/2023 12:00 AM     Problem: Depression Identification (Depression)      Goal: Depressive Symptoms Identified   Start Date: 08/30/2023  Note:    Timeframe:  Short-Range Goal Priority:  High Start Date:  08/30/23            Expected End Date:  ongoing                     Follow Up Date-- 10/21/23 at 1 pm  - keep 90 percent of scheduled  appointments -consider counseling or psychiatry -consider bumping up your self-care  -consider creating a stronger support network   Why is this important?             Combating depression may take some time.            If you don't feel better right away, don't give up on your treatment plan.    Current barriers:   Chronic Mental Health needs related to symptoms of depression, stress and possible cognitive concerns with processing new information. Patient also suspects that he has ADHD. Patient requires Support, Education, Resources, Referrals, Advocacy, and Care Coordination, in order to meet Unmet Mental Health Needs and to find a therapist and psychiatrist. SDOH barriers: navigating the internet, lack of food resources, unable to keep up with appointments and afford bills.  Patient will implement clinical interventions discussed today to decrease symptoms of depression and increase knowledge and/or ability of: coping skills. Mental Health Concerns and Social Isolation Patient lacks knowledge of available community counseling agencies and resources.  Clinical Goal(s): verbalize understanding of plan for management of Depression, and Stress symptoms and demonstrate a reduction in symptoms. Patient will connect with a provider for ongoing mental health treatment, increase coping skills, healthy habits, self-management skills, and stress reduction         Patient Goals/Self-Care Activities: Take medications as prescribed   Attend all scheduled provider appointments Call pharmacy for medication refills  3-7 days in advance of running out of medications Perform all self care activities independently  Perform IADL's (shopping, preparing meals, housekeeping, managing finances) independently Call provider office for new concerns or questions Work with the social worker to address care coordination needs and will continue to work with the clinical team to address health care and disease management  related needs call 1-800-273-TALK (toll free, 24 hour hotline) If in a crisis, go to [facility] [address], [place] [phone]) call 911 if experiencing a Mental Health or Behavioral Health Crisis  Utilize healthy coping skills and supportive resources discussed Contact PCP with any questions or concerns Keep 90 percent of counseling appointments Call your insurance provider for more information about your Enhanced Benefits  Check out counseling resources provided  Begin personal counseling with LCSW, to reduce and manage symptoms of Depression and Stress, until well-established with mental health provider Accept all calls from representative with Behavioral Health Providers in an effort to establish ongoing mental health counseling and supportive services. Incorporate into daily practice - relaxation techniques, deep breathing exercises, and mindfulness meditation strategies. Talk about feelings with friends, family members, spiritual advisor, etc. Contact LCSW directly [phone]), if you have questions, need assistance, or if additional social work needs are identified between now and our next scheduled telephone outreach call. Call 988 for mental health hotline/crisis line if needed (24/7 available) Try techniques to reduce symptoms of anxiety/negative thinking (deep breathing, distraction, positive self talk, etc)  - develop a personal safety plan - develop a plan to deal with triggers like holidays, anniversaries - exercise at least 2 to 3 times per week - have a plan for how to handle bad days - journal feelings and what helps to feel better or worse - spend time or talk with others at least 2 to 3 times per week - watch for early signs of feeling worse - begin personal counseling - call and visit an old friend - check out volunteer opportunities - join a support group - laugh; watch a funny movie or comedian - learn and use  visualization or guided imagery - perform a random act of kindness - practice relaxation or meditation daily - start or continue a personal journal - practice positive thinking and self-talk -continue with compliance of taking medication  -identify current effective and ineffective coping strategies.  -implement positive self-talk in care to increase self-esteem, confidence and feelings of control.  -consider alternative and complementary therapy approaches such as meditation, mindfulness or yoga.  Call your insurance provider to gain education on benefits if desired Call your primary care doctor if symptoms get worse  -journaling, prayer, worship services, meditation or pastoral counseling.  -increase participation in pleasurable group activities such as hobbies, singing, sports or volunteering).  -consider the use of meditative movement therapy such as tai chi, yoga or qigong.  -start a regular daily exercise program based on tolerance, ability and patient choice to support positive thinking and activity    Follow Up Plan:  The patient has been provided with contact information for the care management team and has been advised to call with any mental health or health related questions or concerns.  The care management team will reach out to the patient again over the next 30 business  days.   If you are experiencing a Mental Health or Behavioral Health Crisis or need someone to talk to, please call the Suicide and Crisis Lifeline: 988    Patient Goals: Follow up  goal     24- Hour Availability:    [facility]  [address] [place], [place] [phone] Crisis [phone]   [facility] [phone]  [facility]  [phone]    [facility]  [phone] (after hours)   Therapeutic Alternative/Mobile Crisis   [phone]   [place] National Suicide Hotline  743-722-1756 [place] Child[place]) [place]  016   Call [phone]) [phone] for mental health emergencies   [facility]  [phone]);  [place] and [employer]  [phone]); [place], [place], [place], [place], [place], [place], [place]    [facility] for [facility] Residents For 24/7 walk-up access to mental health services for [facility] children (4+), adolescents and adults, please visit the [facility] located at [address] in [place], [place].  *[facility] also provides comprehensive outpatient behavioral health services in a variety of locations around the [place].  Connect With [place] [address] [place], [place] [address] HelpLine: [phone] or 1-(614)650-3622  Get Directions  Find Help 24/7 By Phone Call our 24-hour HelpLine at (424) 257-5015 or (409)595-5655 for immediate assistance for mental health and substance abuse issues.  Walk-In Help [place]: [facility] (Ages 4 and Up) [place]: Emergency Dept., [facility] Additional Resources National Hopeline Network: 1-800-SUICIDE The National Suicide Prevention Lifeline: 1-800-273-TALK     The following coping skill education was provided for stress relief and mental health management: "When your car dies or a deadline looms, how do you respond? Long-term, low-grade or acute stress takes a serious toll on your body and mind, so don't ignore feelings of constant tension. Stress is a natural part of life. However, too much stress can harm our health, especially if it continues every day. This is chronic stress and can put you at risk for heart problems like heart disease and depression. Understand what's happening inside your body and learn simple coping skills to combat the negative impacts of everyday stressors.  Types of Stress There are two types of stress: Emotional - types of emotional stress are relationship problems,  pressure at work, financial worries, experiencing discrimination or having a major life change. Physical - Examples of physical stress include being sick having pain, not sleeping well, recovery from an injury or having an alcohol and drug use disorder. Fight or Flight Sudden or ongoing stress activates your nervous system and floods your bloodstream with adrenaline and cortisol, two hormones that raise blood pressure, increase heart rate and spike blood sugar. These changes pitch your body into a fight or flight response. That enabled our ancestors to outrun saber-toothed tigers, and it's helpful today for situations like dodging a car accident. But most modern chronic stressors, such as finances or a challenging relationship, keep your body in that heightened state, which hurts your health. Effects of Too Much Stress If constantly under stress, most of [place] will eventually start to function less well.  Multiple studies link chronic stress to a higher risk of heart disease, stroke, depression, weight gain, memory loss and even premature death, so it's important to recognize the warning signals. Talk to your doctor about ways to manage stress if you're experiencing any of these symptoms: Prolonged periods of poor sleep. Regular, severe headaches. Unexplained weight loss or gain. Feelings of isolation, withdrawal or worthlessness. Constant anger and irritability. Loss of interest in activities. Constant worrying or obsessive thinking. Excessive alcohol  or drug use. Inability to concentrate.  10 Ways to Cope with Chronic Stress It's key to recognize stressful situations as they occur because it allows you to focus on managing how you react. We all need to know when to close our eyes and take a deep breath when we feel tension rising. Use these tips to prevent or reduce chronic stress. 1. Rebalance Work and Home All work and no play? If you're spending too much time at the office, intentionally put  more dates in your calendar to enjoy time for fun, either alone or with others. 2. Get Regular Exercise Moving your body on a regular basis balances the nervous system and increases blood circulation, helping to flush out stress hormones. Even a daily 20-minute walk makes a difference. Any kind of exercise can lower stress and improve your mood ? just pick activities that you enjoy and make it a regular habit. 3. Eat Well and Limit Alcohol and Stimulants Alcohol, nicotine and caffeine may temporarily relieve stress but have negative health impacts and can make stress worse in the long run. Well-nourished bodies cope better, so start with a good breakfast, add more organic fruits and vegetables for a well-balanced diet, avoid processed foods and sugar, try herbal tea and drink more water. 4. Connect with Supportive People Talking face to face with another person releases hormones that reduce stress. Lean on those good listeners in your life. 5. Carve Out Hobby Time Do you enjoy gardening, reading, listening to music or some other creative pursuit? Engage in activities that bring you pleasure and joy; research shows that reduces stress by almost half and lowers your heart rate, too. 6. Practice Meditation, Stress Reduction or Yoga Relaxation techniques activate a state of restfulness that counterbalances your body's fight-or-flight hormones. Even if this also means a 10-minute break in a long day: listen to music, read, go for a walk in nature, do a hobby, take a bath or spend time with a friend. Also consider doing a mindfulness exercise or try a daily deep breathing or imagery practice. Deep Breathing Slow, calm and deep breathing can help you relax. Try these steps to focus on your breathing and repeat as needed. Find a comfortable position and close your eyes. Exhale and drop your shoulders. Breathe in through your nose; fill your lungs and then your belly. Think of relaxing your body, quieting your  mind and becoming calm and peaceful. Breathe out slowly through your nose, relaxing your belly. Think of releasing tension, pain, worries or distress. Repeat steps three and four until you feel relaxed. Imagery This involves using your mind to excite the senses -- sound, vision, smell, taste and feeling. This may help ease your stress. Begin by getting comfortable and then do some slow breathing. Imagine a place you love being at. It could be somewhere from your childhood, somewhere you vacationed or just a place in your imagination. Feel how it is to be in the place you're imagining. Pay attention to the sounds, air, colors, and who is there with you. This is a place where you feel cared for and loved. All is well. You are safe. Take in all the smells, sounds, tastes and feelings. As you do, feel your body being nourished and healed. Feel the calm that surrounds you. Breathe in all the good. Breathe out any discomfort or tension. 7. Sleep Enough If you get less than seven to eight hours of sleep, your body won't tolerate stress as well as it could.  If stress keeps you up at night, address the cause, and add extra meditation into your day to make up for the lost z's. Try to get seven to nine hours of sleep each night. Make a regular bedtime schedule. Keep your room dark and cool. Try to avoid computers, TV, cell phones and tablets before bed. 8. Bond with Connections You Enjoy Go out for a coffee with a friend, chat with a neighbor, call a family member, visit with a clergy member, or even hang out with your pet. Clinical studies show that spending even a short time with a companion animal can cut anxiety levels almost in half. 9. Take a Vacation Getting away from it all can reset your stress tolerance by increasing your mental and emotional outlook, which makes you a happier, more productive person upon return. Leave your cellphone and laptop at home! 10. See a Counselor, Coach or Therapist If  negative thoughts overwhelm your ability to make positive changes, it's time to seek professional help. Make an appointment today--your health and life are worth it."  Dickie La, BSW, MSW, LCSW Licensed Clinical Social Worker [employer]   [facility] [email or web address] Direct Dial: [phone]

## 2023-09-23 ENCOUNTER — Other Ambulatory Visit: Payer: Self-pay | Admitting: *Deleted

## 2023-09-23 NOTE — Patient Instructions (Signed)
Visit Information  Karl Barrett was given information about Medicaid Managed Care team care coordination services as a part of their [place] benefit. Karl Barrett verbally consented to engagement with the [facility] Managed Care team.   If you are experiencing a medical emergency, please call 911 or report to your local emergency department or urgent care.   If you have a non-emergency medical problem during routine business hours, please contact your provider's office and ask to speak with a nurse.   For questions related to your [place] health plan, please call: [phone]  If you would like to schedule transportation through your [place] plan, please call the following number at least 2 days in advance of your appointment: [phone].   There is no limit to the number of trips during the year between medical appointments, healthcare facilities, or pharmacies. Transportation must be scheduled at least 2 business days before but not more than thirty 30 days before of your appointment.  Call the Behavioral Health Crisis Line at (806)643-7475, at any time, 24 hours a day, 7 days a week. If you are in danger or need immediate medical attention call 911.  If you would like help to quit smoking, call 1-800-QUIT-NOW ((947)451-5752) OR Espaol: 1-855-Djelo-Ya (0-102-725-3664) o para ms informacin haga clic aqu or Text READY to 403-474 to register via text  Mr. Eisner - following are the goals we discussed in your visit today:   Please see education materials related to Diabetes provided as print materials.   The patient verbalized understanding of instructions, educational materials, and care plan provided today and agreed to receive a mailed copy of patient instructions, educational materials, and care plan.   Telephone follow up appointment with Managed Medicaid care management team member scheduled for:10/25/23 at 9am  Karl Emms  RN, BSN   [facility] RN Care Manager [phone]   Following is a copy of your plan of care:  Care Plan : RN Care Manager Plan of Care  Updates made by Karl Dach, RN since 09/23/2023 12:00 AM     Problem: Health Management needs related to DM and Pain      Long-Range Goal: Development of Plan of Care to address Health Management needs related to DM and Pain   Start Date: 08/24/2023  Expected End Date: 11/22/2023  Note:   Current Barriers:  Chronic Disease Management support and education needs related to DMII and Pain   RNCM Clinical Goal(s):  Patient will verbalize understanding of plan for management of DMII and Pain as evidenced by Patient reports take all medications exactly as prescribed and will call provider for medication related questions as evidenced by patient reports and EMR attend all scheduled medical appointments: 09/24/23 with [facility], 10/04/23 with Pharmacist, BSW on 10/19/23, LCSW on 10/21/23, PCP on 11/17/23 and [facility] on 11/25/23 as evidenced by provider documentation  through collaboration with RN Care manager, provider, and care team.   Interventions: Evaluation of current treatment plan related to  self management and patient's adherence to plan as established by provider Provided patient with [place] transportation information, advised to call [phone], 2-3 days prior to appointment to schedule transportation   Diabetes Interventions:  (Status:  Goal on track:  Yes.) Long Term Goal Assessed patient's understanding of A1c goal: <7% Provided education to patient about basic DM disease process Reviewed medications with patient and discussed importance of medication adherence Counseled on importance of regular laboratory monitoring as  prescribed Discussed plans with patient for ongoing care management follow up and provided patient with direct contact information for care management  team Reviewed scheduled/upcoming provider appointments including: [facility] on 09/24/23, Pharmacist on 10/04/23, BSW on 10/19/23, LCSW on 10/21/23, PCP on 11/17/23 and [name] on 11/25/23 Review of patient status, including review of consultants reports, relevant laboratory and other test results, and medications completed Assessed social determinant of health barriers Provided patient with information to [facility] at [facility] [phone], advised to contact regarding charge account for needed prescriptions-revisited Reviewed member benefits provided by [place], advised patient to call member services [phone] to inquire  Discussed recent eye exam at [facility], waiting for new glasses Advised patient to start taking the medications that he has and add the two remaining medications once he obtains Collaborated with PCP, requesting a glucometer Provided patient with education on checking BS Lab Results  Component Value Date   HGBA1C 9.9 (H) 08/19/2023    Pain Interventions:  (Status:  Goal on track:  Yes.) Long Term Goal Pain assessment performed Medications reviewed Reviewed provider established plan for pain management Discussed importance of adherence to all scheduled medical appointments Counseled on the importance of reporting any/all new or changed pain symptoms or management strategies to pain management provider Advised patient to report to care team affect of pain on daily activities Discussed use of relaxation techniques and/or diversional activities to assist with pain reduction (distraction, imagery, relaxation, massage, acupressure, TENS, heat, and cold application Assessed social determinant of health barriers Discussed appointment with [facility] on 09/24/23, arranged by [facility] Reviewed provider notes and discussed the importance of lowering A1C prior to getting gel injections  Patient Goals/Self-Care Activities: Take all  medications as prescribed Attend all scheduled provider appointments Call provider office for new concerns or questions  keep appointment with eye doctor drink 6 to 8 glasses of water each day fill half of plate with vegetables manage portion size Include a lean protein with each meal/snack Cut out soda and candy  Follow Up Plan:  Telephone follow up appointment with care management team member scheduled for:  10/25/23 at 9am

## 2023-09-23 NOTE — Patient Outreach (Signed)
Medicaid Managed Care   Nurse Care Manager Note  09/23/2023 Name:  Karl Barrett MRN:  [id number] DOB:  01/18/1971  Karl Barrett is an 53 y.o. year old male who is a primary patient of Karl Hay, DO.  The [facility] Managed Care Coordination team was consulted for assistance with:    DMII Pain  Karl Barrett was given information about Medicaid Managed Care Coordination team services today. Karl Barrett Patient agreed to services and verbal consent obtained.  Engaged with patient by telephone for follow up visit in response to provider referral for case management and/or care coordination services.   Patient is participating in a Managed Medicaid Plan:  Yes  Assessments/Interventions:  Review of past medical history, allergies, medications, health status, including review of consultants reports, laboratory and other test data, was performed as part of comprehensive evaluation and provision of chronic care management services.  SDOH (Social Drivers of Health) assessments and interventions performed: SDOH Interventions    Flowsheet Row Patient Outreach Telephone from 09/20/2023 in [place] POPULATION HEALTH DEPARTMENT Patient Outreach Telephone from 08/30/2023 in [place] POPULATION HEALTH DEPARTMENT Telephone from 08/23/2023 in [place] POPULATION HEALTH DEPARTMENT Office Visit from 05/30/2021 in [facility] at [facility] Office Visit from 04/20/2019 in [facility] at [facility] Office Visit from 02/22/2019 in [facility] at [facility]  SDOH Interventions        Food Insecurity Interventions -- -- [id number] Referral, Community Resources Provided -- -- --  Housing Interventions -- -- [employer] Provided, Other (Comment)  [has lot rent and power and cable .] -- -- --  Transportation Interventions -- Programmer, applications Provided, Emergency planning/management officer  Provided, [employer] (Research scientist (life sciences))  Psychologist, forensic information provided] -- -- --  Utilities Interventions -- -- Intervention Not Indicated  [Told about Low income energy assitance program would not be helpful as utilities are not in his] -- -- --  Depression Interventions/Treatment  -- [employer] Provided -- Counseling Medication Medication  Financial Strain Interventions -- [employer] Provided -- -- -- --  Stress Interventions [employer] Provided, Education officer, environmental Provided -- -- -- --       Care Plan  Allergies  Allergen Reactions   Nsaids     Barrett Esophagus     Medications Reviewed Today     Reviewed by Karl Dach, RN (Registered Nurse) on 09/23/23 at [phone]  Med List Status: <None>   Medication Order Taking? Sig Documenting Provider Last Dose Status Informant  albuterol (PROVENTIL HFA) 108 (90 Base) MCG/ACT inhaler [id number] Yes Inhale 1-2 puffs into the lungs every 6 (six) hours as needed for wheezing or shortness of breath. Karl Hay, DO Taking Active   budesonide-formoterol [facility]) 160-4.5 MCG/ACT inhaler [id number] No Inhale 2 puffs into the lungs 2 (two) times daily.  Patient not taking: Reported on 09/23/2023   Karl Kindle, FNP Not Taking Active   empagliflozin (JARDIANCE) 25 MG TABS tablet [id number] No Take 1 tablet (25 mg total) by mouth daily.  Patient not taking: Reported on 09/23/2023   Karl Kindle, FNP Not Taking Active   escitalopram (LEXAPRO) 10 MG tablet [id number] No Take 1 tablet (10 mg total) by mouth at bedtime.  Patient not taking: Reported on 09/23/2023   Karl Kindle, FNP Not Taking Active   glipiZIDE (GLUCOTROL XL) 10 MG 24 hr tablet [id number] No  Take 1 tablet (10 mg total) by mouth daily with breakfast.  Patient not taking: Reported on 09/23/2023   Karl Kindle, FNP Not Taking Active   lisinopril (ZESTRIL) 5 MG tablet [id number] No Take 1 tablet (5 mg total) by mouth daily.   Patient not taking: Reported on 09/23/2023   Karl Kindle, FNP Not Taking Active   rosuvastatin (CRESTOR) 40 MG tablet [id number] No Take 1 tablet (40 mg total) by mouth daily.  Patient not taking: Reported on 09/23/2023   Karl Kindle, FNP Not Taking Active             Patient Active Problem List   Diagnosis Date Noted   Primary osteoarthritis involving multiple joints 08/19/2023   Mood disorder (HCC) 08/19/2023   Hypertension associated with diabetes (HCC) 08/19/2023   Uncontrolled type 2 diabetes mellitus with hyperglycemia (HCC) 08/19/2023   Type 2 diabetes mellitus with diabetic neuropathy, without long-term current use of insulin (HCC) 08/19/2023   Tobacco use disorder, severe, dependence 08/19/2023   Pulmonary emphysema (HCC) 08/19/2023   Encounter for screening prostate specific antigen (PSA) measurement 08/19/2023   Annual physical exam 08/19/2023   History of colon polyps 08/19/2023   Encounter for screening for HIV 08/19/2023   Encounter for hepatitis C screening test for low risk patient 08/19/2023   Cutaneous abscess of abdominal wall 08/19/2023   Hyperlipidemia associated with type 2 diabetes mellitus (HCC) 09/16/2017   Barrett's esophagus with dysplasia 02/19/2015    Conditions to be addressed/monitored per PCP order:  DMII and Pain  Care Plan : RN Care Manager Plan of Care  Updates made by Karl Dach, RN since 09/23/2023 12:00 AM     Problem: Health Management needs related to DM and Pain      Long-Range Goal: Development of Plan of Care to address Health Management needs related to DM and Pain   Start Date: 08/24/2023  Expected End Date: 11/22/2023  Note:   Current Barriers:  Chronic Disease Management support and education needs related to DMII and Pain   RNCM Clinical Goal(s):  Patient will verbalize understanding of plan for management of DMII and Pain as evidenced by Patient reports take all medications exactly as prescribed and will call  provider for medication related questions as evidenced by patient reports and EMR attend all scheduled medical appointments: 09/24/23 with [facility], 10/04/23 with Pharmacist, BSW on 10/19/23, LCSW on 10/21/23, PCP on 11/17/23 and [facility] on 11/25/23 as evidenced by provider documentation  through collaboration with RN Care manager, provider, and care team.   Interventions: Evaluation of current treatment plan related to  self management and patient's adherence to plan as established by provider Provided patient with [employer] transportation information, advised to call [phone], 2-3 days prior to appointment to schedule transportation   Diabetes Interventions:  (Status:  Goal on track:  Yes.) Long Term Goal Assessed patient's understanding of A1c goal: <7% Provided education to patient about basic DM disease process Reviewed medications with patient and discussed importance of medication adherence Counseled on importance of regular laboratory monitoring as prescribed Discussed plans with patient for ongoing care management follow up and provided patient with direct contact information for care management team Reviewed scheduled/upcoming provider appointments including: [facility] on 09/24/23, Pharmacist on 10/04/23, BSW on 10/19/23, LCSW on 10/21/23, PCP on 11/17/23 and [facility] on 11/25/23 Review of patient status, including review of consultants reports, relevant laboratory and other test results, and medications completed Assessed social determinant of health  barriers Provided patient with information to [facility] [phone], advised to contact regarding charge account for needed prescriptions-revisited Reviewed member benefits provided by [employer], advised patient to call member services [phone] to inquire  Discussed recent eye exam at [facility], waiting for new glasses Advised patient to start taking the medications  that he has and add the two remaining medications once he obtains Collaborated with PCP, requesting a glucometer Provided patient with education on checking BS Lab Results  Component Value Date   HGBA1C 9.9 (H) 08/19/2023    Pain Interventions:  (Status:  Goal on track:  Yes.) Long Term Goal Pain assessment performed Medications reviewed Reviewed provider established plan for pain management Discussed importance of adherence to all scheduled medical appointments Counseled on the importance of reporting any/all new or changed pain symptoms or management strategies to pain management provider Advised patient to report to care team affect of pain on daily activities Discussed use of relaxation techniques and/or diversional activities to assist with pain reduction (distraction, imagery, relaxation, massage, acupressure, TENS, heat, and cold application Assessed social determinant of health barriers Discussed appointment with [facility] on 09/24/23, arranged by [facility] Reviewed provider notes and discussed the importance of lowering A1C prior to getting gel injections  Patient Goals/Self-Care Activities: Take all medications as prescribed Attend all scheduled provider appointments Call provider office for new concerns or questions  keep appointment with eye doctor drink 6 to 8 glasses of water each day fill half of plate with vegetables manage portion size Include a lean protein with each meal/snack Cut out soda and candy  Follow Up Plan:  Telephone follow up appointment with care management team member scheduled for:  10/25/23 at 9am      Follow Up:  Patient agrees to Care Plan and Follow-up.  Plan: The Managed Medicaid care management team will reach out to the patient again over the next 30 days.  Date/time of next scheduled RN care management/care coordination outreach:  10/25/23 at 9am  [name] RN, BSN [place]  [facility]  Health RN Care Manager [phone]

## 2023-09-30 ENCOUNTER — Other Ambulatory Visit: Payer: Self-pay | Admitting: Family Medicine

## 2023-09-30 DIAGNOSIS — E1140 Type 2 diabetes mellitus with diabetic neuropathy, unspecified: Secondary | ICD-10-CM

## 2023-09-30 MED ORDER — LANCET DEVICE MISC: 1.0000 | Freq: Every day | 0 refills | Status: AC

## 2023-09-30 MED ORDER — BLOOD GLUCOSE TEST VI STRP
1.0000 | ORAL_STRIP | Freq: Every day | 3 refills | Status: AC
  Filled 2023-12-22: qty 100, 90d supply, fill #0

## 2023-09-30 MED ORDER — LANCETS MISC. MISC: 1.0000 | Freq: Every day | 3 refills | Status: AC

## 2023-09-30 MED ORDER — BLOOD GLUCOSE MONITOR SYSTEM W/DEVICE KIT
1.0000 | PACK | Freq: Every day | 0 refills | Status: AC
  Filled 2023-12-22: qty 1, 30d supply, fill #0

## 2023-10-04 ENCOUNTER — Other Ambulatory Visit: Payer: Self-pay | Admitting: Pharmacist

## 2023-10-04 NOTE — Progress Notes (Signed)
 10/04/2023 Name: Karl Barrett MRN: [id number] DOB: 1971/07/03  Chief Complaint  Patient presents with   Diabetes    Karl Barrett is a 53 y.o. year old male who presented for a telephone visit.   They were referred to the pharmacist by their PCP for assistance in managing diabetes and complex medication management.    Subjective:  Care Team: Primary Care Provider: Carlean Charter, DO ; Next Scheduled Visit: 11/17/23 Clinical Pharmacist: Karl Barrett, PharmD  Medication Access/Adherence  Current Pharmacy:  [facility] [phone] [place], [place] - [address] [place] [address] Phone: [phone] Fax: [phone]   Patient reports affordability concerns with their medications: Yes - had to borrow money to get medications Patient reports access/transportation concerns to their pharmacy: Yes - can't pay insurance + cars won't pass inspection Patient reports adherence concerns with their medications:  No    *Living with nephew Karl Barrett currently in mobile home- triple bypass recently (no job either); daughter (22 yrs) living with them as well with new daycare job or driver's license   Diabetes:  Current medications: Glipizide  XL 10mg  daily, Jardiance  25mg  daily Medications tried in the past: Metformin  (GI issues + nerve pain in legs)  Current glucose readings: N/A Using  meter; testing 0 times daily   Patient denies hypoglycemic s/sx including dizziness, shakiness, sweating. Patient denies hyperglycemic symptoms including polyuria, polydipsia, polyphagia, nocturia, neuropathy, blurred vision.  Current meal patterns:  - Breakfast: Frozen sausage biscuits, cereal - Lunch: Fast food - Supper: Meat + potatoes/corn, lasagna  - Snacks: Little Debbie, pudding - Drinks: Soda (Dr. Kathlene Barrett or Karl Barrett), sweet tea  **Has good understanding of healthy food and   Current physical activity: Not a lot- was doing a manual labor job  Current medication  access support: Medicaid only- $4 copays   Objective:  Lab Results  Component Value Date   HGBA1C 9.9 (H) 08/19/2023    Lab Results  Component Value Date   CREATININE 0.82 08/19/2023   BUN 16 08/19/2023   NA 138 08/19/2023   K 4.7 08/19/2023   CL 101 08/19/2023   CO2 23 08/19/2023    Lab Results  Component Value Date   CHOL 195 08/19/2023   HDL 45 08/19/2023   LDLCALC 124 (H) 08/19/2023   TRIG 148 08/19/2023   CHOLHDL 4.3 08/19/2023    Medications Reviewed Today   Medications were not reviewed in this encounter       Assessment/Plan:   Diabetes: - Currently uncontrolled - Reviewed long term cardiovascular and renal outcomes of uncontrolled blood sugar - Reviewed goal A1c, goal fasting, and goal 2 hour post prandial glucose - Reviewed dietary modifications including cutting out sodas and sweets with snacks first - Reviewed lifestyle modifications including adding more exercise where possible - Was having stomach upset/diarrhea and had to pull back on some meds; doing them all now since last Monday and spacing them out some  - Advised Jardiance  + Glipizide  XL have to be in the morning; Lexapro  at night per instructions   Follow Up Plan:  - Follow-up on 4/10 to see how BG readings are doing and titrate medications - Advised to use [facility] pharmacy for Medicaid charge account if needed- had to borrow money this month - Need to follow-up with [facility] pharmacy to ask about getting testing supplies ready - Advised that new A1c will only be 60% accurate to current readings due to delayed start - Follow-up 2 months out as this  will give the patient time to get everything else together during this timeframe and used to meds a little more   *Of note, patient recently lost his job and has no money- trying to organize appts and resources currently   Karl Barrett, PharmD [facility] Phone Number: [phone]

## 2023-10-18 ENCOUNTER — Ambulatory Visit (INDEPENDENT_AMBULATORY_CARE_PROVIDER_SITE_OTHER): Payer: Medicaid Other | Admitting: Psychiatry

## 2023-10-18 ENCOUNTER — Encounter: Payer: Self-pay | Admitting: Psychiatry

## 2023-10-18 VITALS — BP 118/76 | HR 85 | Temp 98.4°F | Ht 73.0 in | Wt 211.2 lb

## 2023-10-18 DIAGNOSIS — F411 Generalized anxiety disorder: Secondary | ICD-10-CM | POA: Diagnosis not present

## 2023-10-18 DIAGNOSIS — R41840 Attention and concentration deficit: Secondary | ICD-10-CM | POA: Diagnosis not present

## 2023-10-18 DIAGNOSIS — F330 Major depressive disorder, recurrent, mild: Secondary | ICD-10-CM

## 2023-10-18 MED ORDER — BUPROPION HCL 75 MG PO TABS: 75.0000 mg | ORAL_TABLET | Freq: Every morning | ORAL | 1 refills | Status: DC

## 2023-10-18 NOTE — Patient Instructions (Signed)
Bupropion Tablets (Depression/Mood Disorders) What is this medication? BUPROPION (byoo PROE pee on) treats depression. It increases norepinephrine and dopamine in the brain, hormones that help regulate mood. It belongs to a group of medications called NDRIs. This medicine may be used for other purposes; ask your health care provider or pharmacist if you have questions. COMMON BRAND NAME(S): Wellbutrin What should I tell my care team before I take this medication? They need to know if you have any of these conditions: An eating disorder, such as anorexia or bulimia Bipolar disorder or psychosis Diabetes or high blood sugar, treated with medication Glaucoma Head injury or brain tumor Heart disease, previous heart attack, or irregular heart beat High blood pressure Kidney disease Liver disease Seizures Suicidal thoughts, plans, or attempt by you or a family member Tourette syndrome Weight loss An unusual or allergic reaction to bupropion, other medications, foods, dyes, or preservatives Pregnant or trying to become pregnant Breastfeeding How should I use this medication? Take this medication by mouth with a glass of water. Follow the directions on the prescription label. You can take it with or without food. If it upsets your stomach, take it with food. Take your medication at regular intervals. Do not take your medication more often than directed. Do not stop taking this medication suddenly except upon the advice of your care team. Stopping this medication too quickly may cause serious side effects or your condition may worsen. A special MedGuide will be given to you by the pharmacist with each prescription and refill. Be sure to read this information carefully each time. Talk to your care team regarding the use of this medication in children. Special care may be needed. Overdosage: If you think you have taken too much of this medicine contact a poison control center or emergency room at  once. NOTE: This medicine is only for you. Do not share this medicine with others. What if I miss a dose? If you miss a dose, take it as soon as you can. If it is less than four hours to your next dose, take only that dose and skip the missed dose. Do not take double or extra doses. What may interact with this medication? Do not take this medication with any of the following: Linezolid MAOIs, such as Azilect, Carbex, Eldepryl, Marplan, Nardil, and Parnate Methylene blue (injected into a vein) Other medications that contain bupropion, such as Zyban This medication may also interact with the following: Alcohol Certain medications for anxiety or sleep Certain medications for blood pressure, such as metoprolol, propranolol Certain medications for HIV or AIDS, such as efavirenz, lopinavir, nelfinavir, ritonavir Certain medications for irregular heartbeat, such as propafenone, flecainide Certain medications for mental health conditions Certain medications for Parkinson disease, such as amantadine, levodopa Certain medications for seizures, such as carbamazepine, phenytoin, phenobarbital Cimetidine Clopidogrel Cyclophosphamide Digoxin Furazolidone Isoniazid Nicotine Orphenadrine Procarbazine Steroid medications, such as prednisone or cortisone Stimulant medications for attention disorders, weight loss, or to stay awake Tamoxifen Theophylline Thiotepa Ticlopidine Tramadol Warfarin This list may not describe all possible interactions. Give your health care provider a list of all the medicines, herbs, non-prescription drugs, or dietary supplements you use. Also tell them if you smoke, drink alcohol, or use illegal drugs. Some items may interact with your medicine. What should I watch for while using this medication? Tell your care team if your symptoms do not get better or if they get worse. Visit your care team for regular checks on your progress. Because it may take several  weeks to see  the full effects of this medication, it is important to continue your treatment as prescribed. This medication may cause thoughts of suicide or depression. This includes sudden changes in mood, behaviors, or thoughts. These changes can happen at any time but are more common in the beginning of treatment or after a change in dose. Call your care team right away if you experience these thoughts or worsening depression. This medication may cause mood and behavior changes, such as anxiety, nervousness, irritability, hostility, restlessness, excitability, hyperactivity, or trouble sleeping. These changes can happen at any time but are more common in the beginning of treatment or after a change in dose. Call your care team right away if you notice any of these symptoms. This medication may cause serious skin reactions. They can happen weeks to months after starting the medication. Contact your care team right away if you notice fevers or flu-like symptoms with a rash. The rash may be red or purple and then turn into blisters or peeling of the skin. You may also notice a red rash with swelling of the face, lips, or lymph nodes in your neck or under your arms. Avoid drinks that contain alcohol while taking this medication. Drinking large amounts of alcohol, using sleeping or anxiety medications, or quickly stopping the use of these agents while taking this medication may increase your risk for a seizure. This medication may affect your coordination, reaction time, or judgment. Do not drive or operate machinery until you know how this medication affects you. Do not take this medication close to bedtime. It may prevent you from sleeping. Your mouth may get dry. Chewing sugarless gum or sucking hard candy and drinking plenty of water may help. Contact your care team if the problem does not go away or is severe. What side effects may I notice from receiving this medication? Side effects that you should report to your  care team as soon as possible: Allergic reactions--skin rash, itching, hives, swelling of the face, lips, tongue, or throat Increase in blood pressure Mood and behavior changes--anxiety, nervousness, confusion, hallucinations, irritability, hostility, thoughts of suicide or self-harm, worsening mood, feelings of depression Redness, blistering, peeling, or loosening of the skin, including inside the mouth Seizures Sudden eye pain or change in vision such as blurry vision, seeing halos around lights, vision loss Side effects that usually do not require medical attention (report to your care team if they continue or are bothersome): Constipation Dizziness Dry mouth Loss of appetite Nausea Tremors or shaking Trouble sleeping This list may not describe all possible side effects. Call your doctor for medical advice about side effects. You may report side effects to FDA at 1-800-FDA-1088. Where should I keep my medication? Keep out of the reach of children and pets. Store at room temperature between 20 and 25 degrees C (68 and 77 degrees F), away from direct sunlight and moisture. Keep tightly closed. Throw away any unused medication after the expiration date. NOTE: This sheet is a summary. It may not cover all possible information. If you have questions about this medicine, talk to your doctor, pharmacist, or health care provider.  2024 Elsevier/Gold Standard (2022-05-03 00:00:00)

## 2023-10-18 NOTE — Progress Notes (Unsigned)
 Psychiatric Initial Adult Assessment   Patient Identification: Karl Barrett MRN:  [id number] Date of Evaluation:  10/18/2023 Referral Source: Merita Norton FNP Chief Complaint:   Chief Complaint  Patient presents with   Establish Care   Depression   Anxiety   Visit Diagnosis:    ICD-10-CM   1. MDD (major depressive disorder), recurrent episode, mild (HCC)  F33.0 buPROPion (WELLBUTRIN) 75 MG tablet    2. GAD (generalized anxiety disorder)  F41.1     3. Attention and concentration deficit  R41.840       History of Present Illness:  [name] is a 53 year old Caucasian male, currently unemployed, separated, lives in [place], has a history of depression, anxiety, pulmonary emphysema, hypertension, Barrett's fakers, hyperlipidemia, type 2 diabetes mellitus, primary osteoarthritis involving multiple joints was evaluated in office today, presented to establish care.  He has a history of depression and anxiety exacerbated by significant life stressors over the past five years, including job loss, divorce, and family issues. He feels overwhelmed, with racing thoughts, difficulty sleeping, and a lack of appetite due to financial constraints. Denies serious suicidal ideation, but he sometimes feels like 'giving up'.  He experiences sleep disturbances, with racing thoughts at night, and often sleeps on a lounger due to living arrangements. Despite this, he sometimes finds comfort in the position due to shoulder and knee pain. No panic attacks, but he sometimes worries excessively. Denies hallucinations or paranoia.  Patient reports appetite varies.  Most of the time he is unable to eat since he has financial constraints.  He does have food stamps however he has to stretch it out sometimes.  Patient does report a history of attention and focus deficit all his life, including trouble with reading comprehension and concentration, impacting his educational and vocational pursuits. He is  undergoing vocational rehabilitation and is concerned about his ability to focus if he returns to school. There is a family history of ADHD, a daughter with ADHD, a niece who has ADHD and an uncle who may have had high functioning Asperger's.  Patient is interested in referral for testing.  Patient reports a history of trauma, reports his daughter went through something traumatic when she was 55 years old and that has been emotionally traumatic to him as well.  He currently denies any PTSD symptoms.  Patient is currently on Lexapro started by primary care provider few months ago.  Reports he is tolerating the dosage well.  Denies side effects.    Associated Signs/Symptoms: Depression Symptoms:  depressed mood, insomnia, fatigue, difficulty concentrating, anxiety, disturbed sleep, (Hypo) Manic Symptoms:   Denies Anxiety Symptoms:  Excessive Worry, Psychotic Symptoms:   Denies PTSD Symptoms: Had a traumatic exposure:  as noted above  Past Psychiatric History: Patient denies inpatient behavioral health admissions.  Denies suicide attempts.  Patient denies being in psychotherapy previously.  Previous Psychotropic Medications: Yes Lexapro  Substance Abuse History in the last 12 months: Patient reports previous history of using cannabis in his early 72s.  Currently denies any use.  Reports recent use of CBD Gummies.  Consequences of Substance Abuse: Legal Consequences:  yes in the past patient does report multiple legal issues in the past including DWIs at least x 3.  Patient currently denies pending charges.  Past Medical History:  Past Medical History:  Diagnosis Date   Anxiety    Barrett esophagus    Depression    Diabetes mellitus, type II (HCC)    Hyperlipidemia    Prediabetes  Past Surgical History:  Procedure Laterality Date   COLONOSCOPY  2008   COLONOSCOPY  02/02/2018   Tubular adenoma   ESOPHAGOGASTRODUODENOSCOPY  2010   ESOPHAGOGASTRODUODENOSCOPY  02/02/2018    no dysplasia, no Barrett   LITHOTRIPSY      Family Psychiatric History: As noted below.  Family History:  Family History  Problem Relation Age of Onset   Hypertension Mother    Heart disease Mother    Thyroid disease Mother    Pulmonary fibrosis Mother    Heart disease Father    Diabetes Father    Asperger's syndrome Paternal Uncle    Cancer Maternal Grandfather        pancreatic   ADD / ADHD Niece    ADD / ADHD Daughter     Social History:   Social History   Socioeconomic History   Marital status: Legally Separated    Spouse name: Not on file   Number of children: 2   Years of education: Not on file   Highest education level: GED or equivalent  Occupational History   Not on file  Tobacco Use   Smoking status: Every Day    Current packs/day: 1.25    Average packs/day: 1.3 packs/day for 28.0 years (35.0 ttl pk-yrs)    Types: Cigarettes    Passive exposure: Past   Smokeless tobacco: Never  Vaping Use   Vaping status: Former  Substance and Sexual Activity   Alcohol use: Not Currently   Drug use: No   Sexual activity: Not Currently  Other Topics Concern   Not on file  Social History Narrative   Not on file   Social Drivers of Health   Financial Resource Strain: High Risk (08/30/2023)   Overall Financial Resource Strain (CARDIA)    Difficulty of Paying Living Expenses: Very hard  Food Insecurity: Food Insecurity Present (08/23/2023)   Hunger Vital Sign    Worried About Running Out of Food in the Last Year: Sometimes true    Ran Out of Food in the Last Year: Sometimes true  Transportation Needs: Unmet Transportation Needs (08/30/2023)   PRAPARE - Administrator, Civil Service (Medical): Yes    Lack of Transportation (Non-Medical): Yes  Physical Activity: Not on file  Stress: Stress Concern Present (09/20/2023)   [employer] of Occupational Health - Occupational Stress Questionnaire    Feeling of Stress : Rather much  Social Connections:  Unknown (08/23/2023)   Social Connection and Isolation Panel [NHANES]    Frequency of Communication with Friends and Family: Twice a week    Frequency of Social Gatherings with Friends and Family: Once a week    Attends Religious Services: Patient declined    Database administrator or Organizations: Not on file    Attends Banker Meetings: Not on file    Marital Status: Not on file    Additional Social History: Patient was born and raised by both parents in [place].  Patient has 2 brothers and 2 sisters.  Patient got a GED.  Patient used to work at a [employer] previously.  Patient is currently unemployed.  Patient is trying to work with vocational rehab limitation to find a new job.  Patient reports he is religious.  Patient was married x 1, currently separated since the past couple of years.  He has 2 daughters.  Patient currently lives with her nephew at [place].  Patient reports his daughter also lives in the same home.  His sister  supports him financially.  He is currently trying to find a new job and is working with vocational rehabilitation.  Patient does report a history of legal problems in the past, currently none pending.  Does report he has access to a gun, safely locked away.  Allergies:   Allergies  Allergen Reactions   Nsaids     Barrett Esophagus     Metabolic Disorder Labs: Lab Results  Component Value Date   HGBA1C 9.9 (H) 08/19/2023   No results found for: "PROLACTIN" Lab Results  Component Value Date   CHOL 195 08/19/2023   TRIG 148 08/19/2023   HDL 45 08/19/2023   CHOLHDL 4.3 08/19/2023   LDLCALC 124 (H) 08/19/2023   LDLCALC 94 05/30/2021   Lab Results  Component Value Date   TSH 1.360 08/19/2023    Therapeutic Level Labs: No results found for: "LITHIUM" No results found for: "CBMZ" No results found for: "VALPROATE"  Current Medications: Current Outpatient Medications  Medication Sig Dispense Refill   albuterol (PROVENTIL HFA) 108  (90 Base) MCG/ACT inhaler Inhale 1-2 puffs into the lungs every 6 (six) hours as needed for wheezing or shortness of breath. 18 g 0   Blood Glucose Monitoring Suppl DEVI 1 each by Does not apply route daily before breakfast. May substitute to any manufacturer covered by patient's insurance. 1 each 0   budesonide-formoterol (SYMBICORT) 160-4.5 MCG/ACT inhaler Inhale 2 puffs into the lungs 2 (two) times daily. 10.2 g 11   buPROPion (WELLBUTRIN) 75 MG tablet Take 1 tablet (75 mg total) by mouth in the morning. 30 tablet 1   empagliflozin (JARDIANCE) 25 MG TABS tablet Take 1 tablet (25 mg total) by mouth daily. 90 tablet 0   escitalopram (LEXAPRO) 10 MG tablet Take 1 tablet (10 mg total) by mouth at bedtime. 90 tablet 1   glipiZIDE (GLUCOTROL XL) 10 MG 24 hr tablet Take 1 tablet (10 mg total) by mouth daily with breakfast. 90 tablet 0   Glucose Blood (BLOOD GLUCOSE TEST STRIPS) STRP 1 each by In Vitro route daily before breakfast. May substitute to any manufacturer covered by patient's insurance. 100 strip 3   Lancet Device MISC 1 each by Does not apply route daily before breakfast. May substitute to any manufacturer covered by patient's insurance. 1 each 0   Lancets Misc. MISC 1 each by Does not apply route daily before breakfast. May substitute to any manufacturer covered by patient's insurance. 100 each 3   lisinopril (ZESTRIL) 5 MG tablet Take 1 tablet (5 mg total) by mouth daily. 90 tablet 3   rosuvastatin (CRESTOR) 40 MG tablet Take 1 tablet (40 mg total) by mouth daily. 90 tablet 3   No current facility-administered medications for this visit.    Musculoskeletal: Strength & Muscle Tone: within normal limits Gait & Station: normal Patient leans: N/A  Psychiatric Specialty Exam: Review of Systems  Psychiatric/Behavioral:  Positive for decreased concentration, dysphoric mood and sleep disturbance. The patient is nervous/anxious.     Blood pressure 118/76, pulse 85, temperature 98.4 F (36.9  C), temperature source Temporal, height 6\' 1"  (1.854 m), weight 211 lb 3.2 oz (95.8 kg), SpO2 98%.Body mass index is 27.86 kg/m.  General Appearance: Fairly Groomed  Eye Contact:  Fair  Speech:  Normal Rate  Volume:  Normal  Mood:  Anxious and Depressed  Affect:  Congruent  Thought Process:  Goal Directed and Descriptions of Associations: Intact  Orientation:  Full (Time, Place, and Person)  Thought Content:  Logical  Suicidal Thoughts:  No  Homicidal Thoughts:  No  Memory:  Immediate;   Fair Recent;   Fair Remote;   Fair  Judgement:  Fair  Insight:  Fair  Psychomotor Activity:  Normal  Concentration:  Concentration: Fair and Attention Span: Fair  Recall:  [employer] of Knowledge:Fair  Language: Fair  Akathisia:  No  Handed:  Right  AIMS (if indicated):  not done  Assets:  Desire for Improvement Housing Social Support Transportation  ADL's:  Intact  Cognition: WNL  Sleep:  Poor   Screenings: GAD-7    Flowsheet Row Office Visit from 08/19/2023 in [facility] Office Visit from 05/30/2021 in [facility] at [facility] Office Visit from 02/22/2019 in [facility] Primary Care & [facility] at [facility]  Total GAD-7 Score 11 6 14       PHQ2-9    Flowsheet Row Patient Outreach Telephone from 08/30/2023 in [place] [facility]PARTMENT Office Visit from 08/19/2023 in [facility] Office Visit from 05/30/2021 in [facility] Primary Care & Sports Medicine at [facility] Office Visit from 07/13/2019 in [facility] Medicine at [facility] Office Visit from 04/20/2019 in [facility] Primary Care & Sports Medicine at [facility]  PHQ-2 Total Score 6 6 2  0 2  PHQ-9 Total Score 21 23 9  0 6      Flowsheet Row ED from 07/26/2022 in [facility] Emergency Department at [facility] ED from 06/05/2021 in [facility] Emergency Department at  [facility]  C-SSRS RISK CATEGORY No Risk No Risk       Assessment and Plan: Brylee Mcgreal is a 53 year old Caucasian male, has a history of depression, anxiety, multiple medical problems currently presents with anxiety, depression symptoms as well as concerns for ADHD, discussed assessment and plan as noted below.  Major Depressive Disorder-unstable Presents with symptoms of depression including sadness, hopelessness, and feelings of being overwhelmed. Reports difficulty sleeping, racing thoughts, and situational stressors including job loss, divorce, and financial instability. No history of serious suicidal ideation or attempts. Currently on Lexapro. Discussed starting bupropion to help with focus and concentration. Informed about potential side effects including anger issues, irritability, sleep problems, and appetite changes. Benefits include no sexual side effects or weight gain. Advised that it takes 6-8 weeks to build up in the system. Discussed the importance of sleep hygiene and setting a consistent bedtime and wake-up time. - Start Bupropion 75 mg in the morning - Continue Lexapro - Refer to therapy for coping strategies and sleep hygiene - Refer to [facility] for ADHD testing  Generalized Anxiety Disorder-unstable Reports symptoms of anxiety including worry and feeling overwhelmed. No history of panic attacks or severe anxiety symptoms. Anxiety appears to be situational and related to current life stressors. Discussed the importance of therapy for coping strategies. - Continue Lexapro 10 mg daily - Refer to therapy for coping strategies  Attention-Deficit/Hyperactivity Disorder (ADHD) - Suspected Reports long-standing difficulties with focus, concentration, and reading comprehension. Expresses concern about ability to concentrate if returning to school. No formal diagnosis of ADHD. Discussed referral for ADHD testing and potential out-of-pocket costs. Informed that  testing may take several months to schedule. - Refer to  Neuropsychiatry for ADHD testing - Start Bupropion 75 mg in the morning to help with focus and concentration  Reviewed and discussed labs dated 08/19/2019 24-1.360-within normal limits.   I have reviewed notes per primary  care provider-Ms. Robynn Pane Payne-dated 08/19/2023-patient current Lexapro 10 mg and referred to psychiatry.   Follow-up - Schedule follow-up appointment in six weeks.  Collaboration of Care: Referral or follow-up with counselor/therapist AEB patient encouraged to establish care with therapist  Patient/Guardian was advised Release of Information must be obtained prior to any record release in order to collaborate their care with an outside provider. Patient/Guardian was advised if they have not already done so to contact the registration department to sign all necessary forms in order for [place] to release information regarding their care.   Consent: Patient/Guardian gives verbal consent for treatment and assignment of benefits for services provided during this visit. Patient/Guardian expressed understanding and agreed to proceed.  This note was generated in part or whole with voice recognition software. Voice recognition is usually quite accurate but there are transcription errors that can and very often do occur. I apologize for any typographical errors that were not detected and corrected.    [name], MD 2/24/202512:58 PM

## 2023-10-19 ENCOUNTER — Encounter: Payer: Self-pay | Admitting: Family Medicine

## 2023-10-19 ENCOUNTER — Other Ambulatory Visit: Payer: Self-pay

## 2023-10-19 NOTE — Patient Instructions (Signed)
 Visit Information  Karl Barrett was given information about Medicaid Managed Care team care coordination services as a part of their [place] benefit. Karl Barrett verbally consented to engagement with the [facility] Managed Care team.   If you are experiencing a medical emergency, please call 911 or report to your local emergency department or urgent care.   If you have a non-emergency medical problem during routine business hours, please contact your provider's office and ask to speak with a nurse.   For questions related to your [place] health plan, please call: [phone]  If you would like to schedule transportation through your [place] plan, please call the following number at least 2 days in advance of your appointment: [phone].   There is no limit to the number of trips during the year between medical appointments, healthcare facilities, or pharmacies. Transportation must be scheduled at least 2 business days before but not more than thirty 30 days before of your appointment.  Call the Behavioral Health Crisis Line at [phone], at any time, 24 hours a day, 7 days a week. If you are in danger or need immediate medical attention call 911.  If you would like help to quit smoking, call 1-800-QUIT-NOW (331 084 2506) OR Espaol: 1-855-Djelo-Ya (8-756-433-2951) o para ms informacin haga clic aqu or Text READY to 884-166 to register via text  [name] - following are the goals we discussed in your visit today:    The  Patient                                              has been provided with contact information for the Managed Medicaid care management team and has been advised to call with any health related questions or concerns.   Karl Barrett, Karl Barrett, MHA [facility]  Managed Medicaid Social Worker [phone]   Following is a copy of your plan of care:  There are no care plans that you recently modified to  display for this patient.

## 2023-10-19 NOTE — Patient Outreach (Signed)
 Medicaid Managed Care Social Work Note  10/19/2023 Name:  Maclain Cohron MRN:  [id number] DOB:  [birth year]  Arash Karstens is an 53 y.o. year old male who is a primary patient of [name], DO.  The [facility] Managed Care Coordination team was consulted for assistance with:  Community Resources   Mr. Duesing was given information about Medicaid Managed Care Coordination team services today. [name] Patient agreed to services and verbal consent obtained.  Engaged with patient  for by telephone forfollow up visit in response to referral for case management and/or care coordination services.   Patient is participating in a Managed Medicaid Plan:  Yes  Assessments/Interventions:  Review of past medical history, allergies, medications, health status, including review of consultants reports, laboratory and other test data, was performed as part of comprehensive evaluation and provision of chronic care management services.  SDOH: (Social Drivers of Health) assessments and interventions performed: SDOH Interventions    Flowsheet Row Patient Outreach Telephone from 09/20/2023 in [place] POPULATION HEALTH DEPARTMENT Patient Outreach Telephone from 08/30/2023 in [place] POPULATION HEALTH DEPARTMENT Telephone from 08/23/2023 in  POPULATION HEALTH DEPARTMENT Office Visit from 05/30/2021 in [facility] Primary Care & Sports Medicine at [facility] Office Visit from 04/20/2019 in [facility] Primary Care & Sports Medicine at [facility] Office Visit from 02/22/2019 in [facility] Primary Care & Sports Medicine at [facility]  SDOH Interventions        Food Insecurity Interventions -- -- [id number] Referral, Community Resources Provided -- -- --  Housing Interventions -- -- [employer] Provided, Other (Comment)  [has lot rent and power and cable .] -- -- --  Transportation Interventions -- Programmer, applications Provided, Emergency planning/management officer  Provided, [employer] (Research scientist (life sciences))  Psychologist, forensic information provided] -- -- --  Utilities Interventions -- -- Intervention Not Indicated  [Told about Low income energy assitance program would not be helpful as utilities are not in his] -- -- --  Depression Interventions/Treatment  -- [employer] Provided -- Counseling Medication Medication  Financial Strain Interventions -- [employer] Provided -- -- -- --  Stress Interventions [employer] Provided, Education officer, environmental Provided -- -- -- --     BSW completed a telephone outreach with patient, he states he did receive the resources BSW sent. He is waiting to receive his computer back from his sister. Patient states things are going okay. No other resources are needed at this time. BSW provided patient with contact information for any future needs.   Advanced Directives Status:  Not addressed in this encounter.  Care Plan                 Allergies  Allergen Reactions   Nsaids     Barrett Esophagus     Medications Reviewed Today   Medications were not reviewed in this encounter     Patient Active Problem List   Diagnosis Date Noted   GAD (generalized anxiety disorder) 10/18/2023   Attention and concentration deficit 10/18/2023   Primary osteoarthritis involving multiple joints 08/19/2023   Depression 08/19/2023   Hypertension associated with diabetes (HCC) 08/19/2023   Uncontrolled type 2 diabetes mellitus with hyperglycemia (HCC) 08/19/2023   Type 2 diabetes mellitus with diabetic neuropathy, without long-term current use of insulin (HCC) 08/19/2023   Tobacco use disorder, severe, dependence 08/19/2023   Pulmonary emphysema (HCC) 08/19/2023   Encounter for screening prostate specific antigen (PSA) measurement 08/19/2023   Annual physical  exam 08/19/2023   History of colon polyps 08/19/2023   Encounter for screening for HIV 08/19/2023   Encounter for hepatitis C screening  test for low risk patient 08/19/2023   Cutaneous abscess of abdominal wall 08/19/2023   Hyperlipidemia associated with type 2 diabetes mellitus (HCC) 09/16/2017   Barrett's esophagus with dysplasia 02/19/2015    Conditions to be addressed/monitored per PCP order:   community resources  There are no care plans that you recently modified to display for this patient.   Follow up:  Patient agrees to Care Plan and Follow-up.  Plan: The  Patient has been provided with contact information for the Managed Medicaid care management team and has been advised to call with any health related questions or concerns.    Abelino Derrick, MHA [facility]  Managed [facility] Social Worker [phone]

## 2023-10-20 ENCOUNTER — Other Ambulatory Visit: Payer: Self-pay | Admitting: Family Medicine

## 2023-10-20 DIAGNOSIS — J439 Emphysema, unspecified: Secondary | ICD-10-CM

## 2023-10-20 DIAGNOSIS — F17200 Nicotine dependence, unspecified, uncomplicated: Secondary | ICD-10-CM

## 2023-10-20 NOTE — Telephone Encounter (Signed)
 Requested Prescriptions  Pending Prescriptions Disp Refills   albuterol (VENTOLIN HFA) 108 (90 Base) MCG/ACT inhaler [Pharmacy Med Name: VENTOLIN HFA 90 MCG INHALER] 3 each 0    Sig: INHALE 1-2 PUFFS BY MOUTH EVERY 6 HOURS AS NEEDED FOR WHEEZE OR SHORTNESS OF BREATH     Pulmonology:  Beta Agonists 2 Passed - 10/20/2023  3:11 PM      Passed - Last BP in normal range    BP Readings from Last 1 Encounters:  09/07/23 120/76         Passed - Last Heart Rate in normal range    Pulse Readings from Last 1 Encounters:  09/07/23 67         Passed - Valid encounter within last 12 months    Recent Outpatient Visits           2 months ago Annual physical exam   [facility] Merita Norton T, FNP   2 years ago Type II diabetes mellitus with complication [facility])   [place] Primary Care & Sports Medicine at [facility], Nyoka Cowden, MD   4 years ago Type II diabetes mellitus with complication [facility])   [place] Primary Care & Sports Medicine at [facility], Nyoka Cowden, MD   4 years ago Need for influenza vaccination   [facility] Primary Care & Sports Medicine at [facility], Nyoka Cowden, MD   4 years ago Tendonitis of elbow, right   [facility] Primary Care & Sports Medicine at [facility], Nyoka Cowden, MD       Future Appointments             In 4 weeks [place], Monico Blitz, DO [facility]

## 2023-10-21 ENCOUNTER — Telehealth: Payer: Self-pay | Admitting: *Deleted

## 2023-10-21 ENCOUNTER — Other Ambulatory Visit: Payer: Self-pay | Admitting: Licensed Clinical Social Worker

## 2023-10-21 ENCOUNTER — Other Ambulatory Visit: Payer: Self-pay | Admitting: *Deleted

## 2023-10-21 DIAGNOSIS — F17210 Nicotine dependence, cigarettes, uncomplicated: Secondary | ICD-10-CM

## 2023-10-21 DIAGNOSIS — Z87891 Personal history of nicotine dependence: Secondary | ICD-10-CM

## 2023-10-21 DIAGNOSIS — Z122 Encounter for screening for malignant neoplasm of respiratory organs: Secondary | ICD-10-CM

## 2023-10-21 NOTE — Patient Outreach (Signed)
 Medicaid Managed Care Social Work Note  10/21/2023 Name:  Karl Barrett MRN:  [id number] DOB:  1971/02/13  Karl Barrett is an 53 y.o. year old male who is a primary patient of [name], DO.  The Medicaid Managed Care Coordination team was consulted for assistance with:  Mental Health Counseling and Resources  Mr. Angerer was given information about Medicaid Managed Care Coordination team services today. [name] Patient agreed to services and verbal consent obtained.  Engaged with patient  for by telephone forfollow up visit in response to referral for case management and/or care coordination services.   Patient is participating in a Managed Medicaid Plan:  Yes  Assessments/Interventions:  Review of past medical history, allergies, medications, health status, including review of consultants reports, laboratory and other test data, was performed as part of comprehensive evaluation and provision of chronic care management services.  SDOH: (Social Drivers of Health) assessments and interventions performed: SDOH Interventions    Flowsheet Row Patient Outreach Telephone from 09/20/2023 in [place] [facility] DEPARTMENT Patient Outreach Telephone from 08/30/2023 in [place] POPULATION HEALTH DEPARTMENT Telephone from 08/23/2023 in [place] POPULATION HEALTH DEPARTMENT Office Visit from 05/30/2021 in [facility] at [facility] Office Visit from 04/20/2019 in [facility] at [facility] Office Visit from 02/22/2019 in [facility] Primary Care & Sports Medicine at [facility]  SDOH Interventions        Food Insecurity Interventions -- -- [id number] Referral, Community Resources Provided -- -- --  Housing Interventions -- -- [employer] Provided, Other (Comment)  [has lot rent and power and cable .] -- -- --  Transportation Interventions -- Programmer, applications Provided, Oncologist Provided, [employer] (Research scientist (life sciences))  Psychologist, forensic information provided] -- -- --  Utilities Interventions -- -- Intervention Not Indicated  [Told about Low income energy assitance program would not be helpful as utilities are not in his] -- -- --  Depression Interventions/Treatment  -- [employer] Provided -- Counseling Medication Medication  Financial Strain Interventions -- [employer] Provided -- -- -- --  Stress Interventions [employer] Provided, Education officer, environmental Provided -- -- -- --       Advanced Directives Status:  See Care Plan for related entries.  Care Plan                 Allergies  Allergen Reactions   Nsaids     Barrett Esophagus     Medications Reviewed Today     Reviewed by [name], LCSW (Social Worker) on 10/21/23 at 1352  Med List Status: <None>   Medication Order Taking? Sig Documenting Provider Last Dose Status Informant  albuterol (VENTOLIN HFA) 108 (90 Base) MCG/ACT inhaler [id number]  INHALE 1-2 PUFFS BY MOUTH EVERY 6 HOURS AS NEEDED FOR WHEEZE OR SHORTNESS OF BREATH [name], DO  Active   Blood Glucose Monitoring Suppl DEVI [id number] No 1 each by Does not apply route daily before breakfast. May substitute to any manufacturer covered by patient's insurance. [name], DO Taking Active   budesonide-formoterol [facility]) 160-4.5 MCG/ACT inhaler [id number] No Inhale 2 puffs into the lungs 2 (two) times daily. [name], FNP Taking Active   buPROPion [facility]) 75 MG tablet [id number]  Take 1 tablet (75 mg total) by mouth in the morning. [name], MD  Active   empagliflozin (JARDIANCE) 25 MG TABS tablet [id number] No Take  1 tablet (25 mg total) by mouth daily. [name], FNP Taking Active   escitalopram (LEXAPRO) 10 MG tablet [id number] No Take 1 tablet (10 mg total) by mouth at bedtime. [name], FNP Taking Active   glipiZIDE (GLUCOTROL XL) 10 MG 24 hr  tablet [id number] No Take 1 tablet (10 mg total) by mouth daily with breakfast. [name], FNP Taking Active   Glucose Blood (BLOOD GLUCOSE TEST STRIPS) STRP [id number] No 1 each by In Vitro route daily before breakfast. May substitute to any manufacturer covered by patient's insurance. [name], DO Taking Active   Lancet Device MISC [id number] No 1 each by Does not apply route daily before breakfast. May substitute to any manufacturer covered by patient's insurance. [name], DO Taking Active   Lancets Misc. MISC [id number] No 1 each by Does not apply route daily before breakfast. May substitute to any manufacturer covered by patient's insurance. [name], DO Taking Active   lisinopril (ZESTRIL) 5 MG tablet [id number] No Take 1 tablet (5 mg total) by mouth daily. [name], FNP Taking Active   rosuvastatin (CRESTOR) 40 MG tablet [id number] No Take 1 tablet (40 mg total) by mouth daily. [name], FNP Taking Active             Patient Active Problem List   Diagnosis Date Noted   GAD (generalized anxiety disorder) 10/18/2023   Attention and concentration deficit 10/18/2023   Primary osteoarthritis involving multiple joints 08/19/2023   Depression 08/19/2023   Hypertension associated with diabetes (HCC) 08/19/2023   Uncontrolled type 2 diabetes mellitus with hyperglycemia (HCC) 08/19/2023   Type 2 diabetes mellitus with diabetic neuropathy, without long-term current use of insulin (HCC) 08/19/2023   Tobacco use disorder, severe, dependence 08/19/2023   Pulmonary emphysema (HCC) 08/19/2023   Encounter for screening prostate specific antigen (PSA) measurement 08/19/2023   Annual physical exam 08/19/2023   History of colon polyps 08/19/2023   Encounter for screening for HIV 08/19/2023   Encounter for hepatitis C screening test for low risk patient 08/19/2023   Cutaneous abscess of abdominal wall 08/19/2023   Hyperlipidemia associated with type 2 diabetes  mellitus (HCC) 09/16/2017   Barrett's esophagus with dysplasia 02/19/2015    Conditions to be addressed/monitored per PCP order:  Depression  Care Plan : LCSW Plan of Care  Updates made by [name], LCSW since 10/21/2023 12:00 AM     Problem: Depression Identification (Depression)      Goal: Depressive Symptoms Identified   Start Date: 08/30/2023  Note:    Timeframe:  Short-Range Goal Priority:  High Start Date:  08/30/23            Expected End Date:  ongoing                     Follow Up Date-- Goal met and ended on 10/21/23  - keep 90 percent of scheduled appointments -consider counseling or psychiatry -consider bumping up your self-care  -consider creating a stronger support network   Why is this important?             Combating depression may take some time.            If you don't feel better right away, don't give up on your treatment plan.    Current barriers:   Chronic Mental Health needs related to symptoms of depression, stress and possible cognitive concerns with processing new information. Patient also  suspects that he has ADHD. Patient requires Support, Education, Resources, Referrals, Advocacy, and Care Coordination, in order to meet Unmet Mental Health Needs and to find a therapist and psychiatrist. SDOH barriers: navigating the internet, lack of food resources, unable to keep up with appointments and afford bills.  Patient will implement clinical interventions discussed today to decrease symptoms of depression and increase knowledge and/or ability of: coping skills. Mental Health Concerns and Social Isolation Patient lacks knowledge of available community counseling agencies and resources.  Clinical Goal(s): verbalize understanding of plan for management of Depression, and Stress symptoms and demonstrate a reduction in symptoms. Patient will connect with a provider for ongoing mental health treatment, increase coping skills, healthy habits, self-management  skills, and stress reduction        Clinical Interventions:  Assessed patient's previous and current treatment, coping skills, support system and barriers to care. Patient provided hx  Verbalization of feelings encouraged, motivational interviewing employed Emotional support provided, positive coping strategies explored. Establishing healthy boundaries emphasized and healthy self-care education provided Patient was educated on available mental health resources within their area that accept Medicaid and offer counseling and psychiatry. Patient was advised to contact the back of his insurance cards for assistance with benefits as well. Provided patient with [place] [employer] transportation information, advised to call [phone], 2-3 days prior to appointments to schedule transportation. Patient has successfully started [employer] to help him with navigating the internet and getting comfortable with using computers. Patient educated on the difference between therapy and psychiatry per patient request Email sent to patient today with available mental health resources within his area that accept Medicaid and offer the services that she is interested in. Email included instructions for scheduling at [facility] as well as some crisis support resources and [facility]'s walk in clinic hours. Patient will review resources over the next two weeks and make a decision regarding where he wishes to gain MH treatment at. Emotional support provided. CBT intervention implemented regarding "being mentally fit" by combating negative thinking and replacing it with uplifting support, hope and positivity.. Patient reports significant worsening mood impacting their ability to function appropriately and carry out daily task. Assessed social determinant of health barriers Discussed use of relaxation techniques and/or diversional activities to assist with pain reduction (distraction, imagery, relaxation, massage,  acupressure, TENS, heat, and cold application; Reviewed with patient prescribed pharmacological and nonpharmacological pain relief strategies; Patient receives strong support from daughter and nephew.  LCSW provided education on relaxation techniques such as meditation, deep breathing, massage, grounding exercises or yoga that can activate the body's relaxation response and ease symptoms of stress and anxiety. LCSW ask that when pt is struggling with difficult emotions and racing thoughts that they start this relaxation response process. LCSW provided extensive education on healthy coping skills for anxiety. SW used active and reflective listening, validated patient's feelings/concerns, and provided emotional support. Patient will work on implementing appropriate self-care habits into their daily routine such as: staying positive, writing a gratitude list, drinking water, staying active around the house, taking their medications as prescribed, combating negative thoughts or emotions and staying connected with their family and friends. Positive reinforcement provided for this decision to work on this.  Motivational Interviewing employed Depression screen reviewed  PHQ2/ PHQ9 completed or reviewed  Mindfulness or Relaxation training provided Active listening / Reflection utilized  Advance Care and HCPOA education provided Emotional Support Provided Problem Solving /Task Center strategies reviewed Provided psychoeducation for mental health needs  Provided brief CBT  Reviewed mental  health medications and discussed importance of compliance:  Quality of sleep assessed & Sleep Hygiene techniques promoted  Participation in counseling encouraged  Verbalization of feelings encouraged  Suicidal Ideation/Homicidal Ideation assessed: Patient denies SI/HI  Review resources, discussed options and provided patient information about  Mental Health Resources Inter-disciplinary care team collaboration (see  longitudinal plan of care) [facility] LCSW made referral for [facility] BSW involvement per patient's request for financial resource support regarding phone bill, utility, food and financial support and resource connection.  09/20/23 update- Patient has successfully been set up for psychiatry with [facility] for next month. He reports still in need of therapy but is having a hard time deciding where he wishes to gain services at. Patient mentioned that her daughter goes to [facility] and he is interested in possibly gaining treatment there. [facility] LCSW provided extensive education on their enrollment process as well as the services that they provide. Patient shares that he is stable at this time and is aware of his local crisis resources that he can utilize if needed. Brief emotional support provided. Discharge update- Pt reports that he successfully attended his initial psychiatry appointment at [facility]. He states that he has [facility] number and will contact them when he is ready to start therapy treatment but at this time he has a lot of his plate and is overwhelmed with appointments. He denies any urgent needs and was encouraged to contact the number on the back of his insurance card to gain assistance with his DME questions. Patient was reminded of his upcoming appointment with [facility] RNCM on 10/25/23. He is agreeable to LCSW case closure at this time.   Patient Goals/Self-Care Activities: Take medications as prescribed   Attend all scheduled provider appointments Call pharmacy for medication refills 3-7 days in advance of running out of medications Perform all self care activities independently  Perform IADL's (shopping, preparing meals, housekeeping, managing finances) independently Call provider office for new concerns or questions Work with the social worker to address care coordination needs and will continue to work with the clinical team to address health care and disease management related  needs call 1-800-273-TALK (toll free, 24 hour hotline) If in a crisis, go to [facility] [address] [place] [phone]) call 911 if experiencing a Mental Health or Behavioral Health Crisis  Utilize healthy coping skills and supportive resources discussed Contact PCP with any questions or concerns Keep 90 percent of counseling appointments Call your insurance provider for more information about your Enhanced Benefits  Check out counseling resources provided  Begin personal counseling with LCSW, to reduce and manage symptoms of Depression and Stress, until well-established with mental health provider Accept all calls from representative with Behavioral Health Providers in an effort to establish ongoing mental health counseling and supportive services. Incorporate into daily practice - relaxation techniques, deep breathing exercises, and mindfulness meditation strategies. Talk about feelings with friends, family members, spiritual advisor, etc. Contact LCSW directly [phone]), if you have questions, need assistance, or if additional social work needs are identified between now and our next scheduled telephone outreach call. Call 988 for mental health hotline/crisis line if needed (24/7 available) Try techniques to reduce symptoms of anxiety/negative thinking (deep breathing, distraction, positive self talk, etc)  - develop a personal safety plan - develop a plan to deal with triggers like holidays, anniversaries - exercise at least 2 to 3 times per week - have a plan for how to handle bad days - journal  feelings and what helps to feel better or worse - spend time or talk with others at least 2 to 3 times per week - watch for early signs of feeling worse - begin personal counseling - call and visit an old friend - check out volunteer opportunities - join a support group - laugh; watch a funny movie or comedian - learn and use visualization  or guided imagery - perform a random act of kindness - practice relaxation or meditation daily - start or continue a personal journal - practice positive thinking and self-talk -continue with compliance of taking medication  -identify current effective and ineffective coping strategies.  -implement positive self-talk in care to increase self-esteem, confidence and feelings of control.  -consider alternative and complementary therapy approaches such as meditation, mindfulness or yoga.  Call your insurance provider to gain education on benefits if desired Call your primary care doctor if symptoms get worse  -journaling, prayer, worship services, meditation or pastoral counseling.  -increase participation in pleasurable group activities such as hobbies, singing, sports or volunteering).  -consider the use of meditative movement therapy such as tai chi, yoga or qigong.  -start a regular daily exercise program based on tolerance, ability and patient choice to support positive thinking and activity    Follow Up Plan:  The patient has been provided with contact information for the care management team and has been advised to call with any mental health or health related questions or concerns.  The care management team will reach out to the patient again over the next 30 business  days.   If you are experiencing a Mental Health or Behavioral Health Crisis or need someone to talk to, please call the Suicide and Crisis Lifeline: 988    Patient Goals: Follow up goal     Follow up:  Patient requests no follow-up at this time.  Plan: The Managed Medicaid care management team is available to follow up with the patient after provider conversation with the patient regarding recommendation for care management engagement and subsequent re-referral to the care management team.   Dickie La, BSW, MSW, LCSW Licensed Clinical Social Worker [place]   [facility] [email or web address] Direct Dial: [phone]

## 2023-10-21 NOTE — Telephone Encounter (Signed)
 Lung Cancer Screening Narrative/Criteria Questionnaire (Cigarette Smokers Only- No Cigars/Pipes/vapes)   Karl Barrett   SDMV:11/03/23 9:30- Vernona Rieger                                           July 04, 1971              LDCT: 11/08/23 9:00- [facility]    53 y.o.   Phone: [phone]  Lung Screening Narrative (confirm age 75-77 yrs Medicare / 50-80 yrs Private pay insurance)   Insurance information:MCD   Referring Provider:Payne   This screening involves an initial phone call with a team member from our program. It is called a shared decision making visit. The initial meeting is required by insurance and Medicare to make sure you understand the program. This appointment takes about 15-20 minutes to complete. The CT scan will completed at a separate date/time. This scan takes about 5-10 minutes to complete and you may eat and drink before and after the scan.  Criteria questions for Lung Cancer Screening:   Are you a current or former smoker? Current Age began smoking: 20   If you are a former smoker, what year did you quit smoking? (within 15 yrs)   To calculate your smoking history, I need an accurate estimate of how many packs of cigarettes you smoked per day and for how many years. (Not just the number of PPD you are now smoking)   Years smoking 36 x Packs per day 1 = Pack years 36   (at least 20 pack yrs)   (Make sure they understand that we need to know how much they have smoked in the past, not just the number of PPD they are smoking now)  Do you have a personal history of cancer?  No    Do you have a family history of cancer? Yes  (cancer type and and relative) MGF( pancreatic) PGF (lung)  Are you coughing up blood?  No  Have you had unexplained weight loss of 15 lbs or more in the last 6 months? No  It looks like you meet all criteria.     Additional information: N/A

## 2023-10-21 NOTE — Patient Instructions (Signed)
 Visit Information  Karl Barrett was given information about Medicaid Managed Care team care coordination services as a part of their [place] benefit. Karl Barrett verbally consented to engagement with the [facility] Managed Care team.   If you are experiencing a medical emergency, please call 911 or report to your local emergency department or urgent care.   If you have a non-emergency medical problem during routine business hours, please contact your provider's office and ask to speak with a nurse.   For questions related to your [place] health plan, please call: [phone]  If you would like to schedule transportation through your [place] plan, please call the following number at least 2 days in advance of your appointment: [phone].   There is no limit to the number of trips during the year between medical appointments, healthcare facilities, or pharmacies. Transportation must be scheduled at least 2 business days before but not more than thirty 30 days before of your appointment.  Call the Behavioral Health Crisis Line at [phone], at any time, 24 hours a day, 7 days a week. If you are in danger or need immediate medical attention call 911.  If you would like help to quit smoking, call 1-800-QUIT-NOW (206-695-7077) OR Espaol: 1-855-Djelo-Ya (7-253-664-4034) o para ms informacin haga clic aqu or Text READY to 742-595 to register via text  Following is a copy of your plan of care:  Care Plan : LCSW Plan of Care  Updates made by Karl Bryant, LCSW since 10/21/2023 12:00 AM     Problem: Depression Identification (Depression)      Goal: Depressive Symptoms Identified   Start Date: 08/30/2023  Note:    Timeframe:  Short-Range Goal Priority:  High Start Date:  08/30/23            Expected End Date:  ongoing                     Follow Up Date-- Goal met and ended on 10/21/23  - keep 90 percent of scheduled  appointments -consider counseling or psychiatry -consider bumping up your self-care  -consider creating a stronger support network   Why is this important?             Combating depression may take some time.            If you don't feel better right away, don't give up on your treatment plan.    Current barriers:   Chronic Mental Health needs related to symptoms of depression, stress and possible cognitive concerns with processing new information. Patient also suspects that he has ADHD. Patient requires Support, Education, Resources, Referrals, Advocacy, and Care Coordination, in order to meet Unmet Mental Health Needs and to find a therapist and psychiatrist. SDOH barriers: navigating the internet, lack of food resources, unable to keep up with appointments and afford bills.  Patient will implement clinical interventions discussed today to decrease symptoms of depression and increase knowledge and/or ability of: coping skills. Mental Health Concerns and Social Isolation Patient lacks knowledge of available community counseling agencies and resources.  Clinical Goal(s): verbalize understanding of plan for management of Depression, and Stress symptoms and demonstrate a reduction in symptoms. Patient will connect with a provider for ongoing mental health treatment, increase coping skills, healthy habits, self-management skills, and stress reduction        Patient Goals/Self-Care Activities: Take medications as prescribed   Attend all scheduled provider appointments Call pharmacy for medication  refills 3-7 days in advance of running out of medications Perform all self care activities independently  Perform IADL's (shopping, preparing meals, housekeeping, managing finances) independently Call provider office for new concerns or questions Work with the social worker to address care coordination needs and will continue to work with the clinical team to address health care and disease management  related needs call 1-800-273-TALK (toll free, 24 hour hotline) If in a crisis, go to [facility] [address] [place] [phone]) call 911 if experiencing a Mental Health or Behavioral Health Crisis  Utilize healthy coping skills and supportive resources discussed Contact PCP with any questions or concerns Keep 90 percent of counseling appointments Call your insurance provider for more information about your Enhanced Benefits  Check out counseling resources provided  Begin personal counseling with LCSW, to reduce and manage symptoms of Depression and Stress, until well-established with mental health provider Accept all calls from representative with Behavioral Health Providers in an effort to establish ongoing mental health counseling and supportive services. Incorporate into daily practice - relaxation techniques, deep breathing exercises, and mindfulness meditation strategies. Talk about feelings with friends, family members, spiritual advisor, etc. Contact LCSW directly [phone]), if you have questions, need assistance, or if additional social work needs are identified between now and our next scheduled telephone outreach call. Call 988 for mental health hotline/crisis line if needed (24/7 available) Try techniques to reduce symptoms of anxiety/negative thinking (deep breathing, distraction, positive self talk, etc)  - develop a personal safety plan - develop a plan to deal with triggers like holidays, anniversaries - exercise at least 2 to 3 times per week - have a plan for how to handle bad days - journal feelings and what helps to feel better or worse - spend time or talk with others at least 2 to 3 times per week - watch for early signs of feeling worse - begin personal counseling - call and visit an old friend - check out volunteer opportunities - join a support group - laugh; watch a funny movie or comedian - learn and use  visualization or guided imagery - perform a random act of kindness - practice relaxation or meditation daily - start or continue a personal journal - practice positive thinking and self-talk -continue with compliance of taking medication  -identify current effective and ineffective coping strategies.  -implement positive self-talk in care to increase self-esteem, confidence and feelings of control.  -consider alternative and complementary therapy approaches such as meditation, mindfulness or yoga.  Call your insurance provider to gain education on benefits if desired Call your primary care doctor if symptoms get worse  -journaling, prayer, worship services, meditation or pastoral counseling.  -increase participation in pleasurable group activities such as hobbies, singing, sports or volunteering).  -consider the use of meditative movement therapy such as tai chi, yoga or qigong.  -start a regular daily exercise program based on tolerance, ability and patient choice to support positive thinking and activity    Follow Up Plan:  The patient has been provided with contact information for the care management team and has been advised to call with any mental health or health related questions or concerns.  The care management team will reach out to the patient again over the next 30 business  days.   If you are experiencing a Mental Health or Behavioral Health Crisis or need someone to talk to, please call the Suicide and Crisis Lifeline: 988    Patient Goals: Follow  up goal     24- Hour Availability:    [facility]  [address] [place], [place] [phone] Crisis [phone]   [facility] [phone]  [place] [facility]  [phone]    [facility]  [phone] (after hours)   Therapeutic Alternative/Mobile Crisis   [phone]   [place] National Suicide Hotline  6813315148 Len Childs) [place]  016   Call [phone] for mental health emergencies   [facility]  [phone]);  [place] and [employer]  [phone]); [place], [place], [place], [place], [place], [place], [place]    [facility] for [facility] Residents For 24/7 walk-up access to mental health services for [facility] children (4+), adolescents and adults, please visit the [facility] located at [address] in [place], [place].  *[facility] also provides comprehensive outpatient behavioral health services in a variety of locations around the [place].  Connect With [place] [address] [place], [place] [address] HelpLine: (229) 382-8592 or [phone])573-0032  Get Directions  Find Help 24/7 By Phone Call our 24-hour HelpLine at (631) 179-2410 or (450)251-0651 for immediate assistance for mental health and substance abuse issues.  Walk-In Help [place]: [facility] (Ages 4 and Up) [place]: Emergency Dept., [facility] Additional Resources National Hopeline Network: 1-800-SUICIDE The National Suicide Prevention Lifeline: 1-800-273-TALK     The following coping skill education was provided for stress relief and mental health management: "When your car dies or a deadline looms, how do you respond? Long-term, low-grade or acute stress takes a serious toll on your body and mind, so don't ignore feelings of constant tension. Stress is a natural part of life. However, too much stress can harm our health, especially if it continues every day. This is chronic stress and can put you at risk for heart problems like heart disease and depression. Understand what's happening inside your body and learn simple coping skills to combat the negative impacts of everyday stressors.  Types of Stress There are two types of stress: Emotional - types of emotional stress are relationship problems,  pressure at work, financial worries, experiencing discrimination or having a major life change. Physical - Examples of physical stress include being sick having pain, not sleeping well, recovery from an injury or having an alcohol and drug use disorder. Fight or Flight Sudden or ongoing stress activates your nervous system and floods your bloodstream with adrenaline and cortisol, two hormones that raise blood pressure, increase heart rate and spike blood sugar. These changes pitch your body into a fight or flight response. That enabled our ancestors to outrun saber-toothed tigers, and it's helpful today for situations like dodging a car accident. But most modern chronic stressors, such as finances or a challenging relationship, keep your body in that heightened state, which hurts your health. Effects of Too Much Stress If constantly under stress, most of [place] will eventually start to function less well.  Multiple studies link chronic stress to a higher risk of heart disease, stroke, depression, weight gain, memory loss and even premature death, so it's important to recognize the warning signals. Talk to your doctor about ways to manage stress if you're experiencing any of these symptoms: Prolonged periods of poor sleep. Regular, severe headaches. Unexplained weight loss or gain. Feelings of isolation, withdrawal or worthlessness. Constant anger and irritability. Loss of interest in activities. Constant worrying or obsessive thinking. Excessive  alcohol or drug use. Inability to concentrate.  10 Ways to Cope with Chronic Stress It's key to recognize stressful situations as they occur because it allows you to focus on managing how you react. We all need to know when to close our eyes and take a deep breath when we feel tension rising. Use these tips to prevent or reduce chronic stress. 1. Rebalance Work and Home All work and no play? If you're spending too much time at the office, intentionally put  more dates in your calendar to enjoy time for fun, either alone or with others. 2. Get Regular Exercise Moving your body on a regular basis balances the nervous system and increases blood circulation, helping to flush out stress hormones. Even a daily 20-minute walk makes a difference. Any kind of exercise can lower stress and improve your mood ? just pick activities that you enjoy and make it a regular habit. 3. Eat Well and Limit Alcohol and Stimulants Alcohol, nicotine and caffeine may temporarily relieve stress but have negative health impacts and can make stress worse in the long run. Well-nourished bodies cope better, so start with a good breakfast, add more organic fruits and vegetables for a well-balanced diet, avoid processed foods and sugar, try herbal tea and drink more water. 4. Connect with Supportive People Talking face to face with another person releases hormones that reduce stress. Lean on those good listeners in your life. 5. Carve Out Hobby Time Do you enjoy gardening, reading, listening to music or some other creative pursuit? Engage in activities that bring you pleasure and joy; research shows that reduces stress by almost half and lowers your heart rate, too. 6. Practice Meditation, Stress Reduction or Yoga Relaxation techniques activate a state of restfulness that counterbalances your body's fight-or-flight hormones. Even if this also means a 10-minute break in a long day: listen to music, read, go for a walk in nature, do a hobby, take a bath or spend time with a friend. Also consider doing a mindfulness exercise or try a daily deep breathing or imagery practice. Deep Breathing Slow, calm and deep breathing can help you relax. Try these steps to focus on your breathing and repeat as needed. Find a comfortable position and close your eyes. Exhale and drop your shoulders. Breathe in through your nose; fill your lungs and then your belly. Think of relaxing your body, quieting your  mind and becoming calm and peaceful. Breathe out slowly through your nose, relaxing your belly. Think of releasing tension, pain, worries or distress. Repeat steps three and four until you feel relaxed. Imagery This involves using your mind to excite the senses -- sound, vision, smell, taste and feeling. This may help ease your stress. Begin by getting comfortable and then do some slow breathing. Imagine a place you love being at. It could be somewhere from your childhood, somewhere you vacationed or just a place in your imagination. Feel how it is to be in the place you're imagining. Pay attention to the sounds, air, colors, and who is there with you. This is a place where you feel cared for and loved. All is well. You are safe. Take in all the smells, sounds, tastes and feelings. As you do, feel your body being nourished and healed. Feel the calm that surrounds you. Breathe in all the good. Breathe out any discomfort or tension. 7. Sleep Enough If you get less than seven to eight hours of sleep, your body won't tolerate stress as well as it  could. If stress keeps you up at night, address the cause, and add extra meditation into your day to make up for the lost z's. Try to get seven to nine hours of sleep each night. Make a regular bedtime schedule. Keep your room dark and cool. Try to avoid computers, TV, cell phones and tablets before bed. 8. Bond with Connections You Enjoy Go out for a coffee with a friend, chat with a neighbor, call a family member, visit with a clergy member, or even hang out with your pet. Clinical studies show that spending even a short time with a companion animal can cut anxiety levels almost in half. 9. Take a Vacation Getting away from it all can reset your stress tolerance by increasing your mental and emotional outlook, which makes you a happier, more productive person upon return. Leave your cellphone and laptop at home! 10. See a Counselor, Coach or Therapist If  negative thoughts overwhelm your ability to make positive changes, it's time to seek professional help. Make an appointment today--your health and life are worth it."  Dickie La, BSW, MSW, LCSW Licensed Clinical Social Worker [employer]   [facility] [email or web address] Direct Dial: [phone]

## 2023-10-25 ENCOUNTER — Other Ambulatory Visit: Payer: Self-pay | Admitting: *Deleted

## 2023-10-25 NOTE — Patient Outreach (Signed)
 Medicaid Managed Care   Nurse Care Manager Note  10/25/2023 Name:  Karl Barrett MRN:  [id number] DOB:  1971-02-26  Karl Barrett is an 53 y.o. year old male who is a primary patient of [name], DO.  The [facility] Managed Care Coordination team was consulted for assistance with:    DMII Pain  Karl Barrett was given information about Medicaid Managed Care Coordination team services today. Karl Barrett Patient agreed to services and verbal consent obtained.  Engaged with patient by telephone for follow up visit in response to provider referral for case management and/or care coordination services.   Patient is participating in a Managed Medicaid Plan:  Yes  Assessments/Interventions:  Review of past medical history, allergies, medications, health status, including review of consultants reports, laboratory and other test data, was performed as part of comprehensive evaluation and provision of chronic care management services.  SDOH (Social Drivers of Health) assessments and interventions performed: SDOH Interventions    Flowsheet Row Patient Outreach Telephone from 09/20/2023 in [place] POPULATION HEALTH DEPARTMENT Patient Outreach Telephone from 08/30/2023 in [place] POPULATION HEALTH DEPARTMENT Telephone from 08/23/2023 in [place] POPULATION HEALTH DEPARTMENT Office Visit from 05/30/2021 in [facility] Primary Care & Sports Medicine at [facility] Office Visit from 04/20/2019 in [facility] Primary Care & [facility] Medicine at [facility] Office Visit from 02/22/2019 in [facility] Primary Care & Sports Medicine at [facility]  SDOH Interventions        Food Insecurity Interventions -- -- [id number] Referral, Community Resources Provided -- -- --  Housing Interventions -- -- [employer] Provided, Other (Comment)  [has lot rent and power and cable .] -- -- --  Transportation Interventions -- Programmer, applications Provided, Emergency planning/management officer  Provided, [employer] (Research scientist (life sciences))  Psychologist, forensic information provided] -- -- --  Utilities Interventions -- -- Intervention Not Indicated  [Told about Low income energy assitance program would not be helpful as utilities are not in his] -- -- --  Depression Interventions/Treatment  -- [employer] Provided -- Counseling Medication Medication  Financial Strain Interventions -- [employer] Provided -- -- -- --  Stress Interventions [employer] Provided, Education officer, environmental Provided -- -- -- --       Care Plan  Allergies  Allergen Reactions   Nsaids     Barrett Esophagus     Medications Reviewed Today     Reviewed by Heidi Dach, RN (Registered Nurse) on 10/25/23 at [id number]  Med List Status: <None>   Medication Order Taking? Sig Documenting Provider Last Dose Status Informant  albuterol (VENTOLIN HFA) 108 (90 Base) MCG/ACT inhaler [id number] Yes INHALE 1-2 PUFFS BY MOUTH EVERY 6 HOURS AS NEEDED FOR WHEEZE OR SHORTNESS OF BREATH Pardue, Sarah N, DO Taking Active   Blood Glucose Monitoring Suppl DEVI [id number] No 1 each by Does not apply route daily before breakfast. May substitute to any manufacturer covered by patient's insurance.  Patient not taking: Reported on 10/25/2023   [name], DO Not Taking Active   budesonide-formoterol [facility]) 160-4.5 MCG/ACT inhaler [id number] Yes Inhale 2 puffs into the lungs 2 (two) times daily. [name], FNP Taking Active   buPROPion [facility]) 75 MG tablet [id number] No Take 1 tablet (75 mg total) by mouth in the morning.  Patient not taking: Reported on 10/25/2023   [name], MD Not Taking Active   empagliflozin (JARDIANCE) 25 MG TABS tablet [id number] Yes Take 1 tablet (25 mg  total) by mouth daily. [name], FNP Taking Active   escitalopram (LEXAPRO) 10 MG tablet [id number] Yes Take 1 tablet (10 mg total) by mouth at bedtime. [name], FNP Taking Active   glipiZIDE  (GLUCOTROL XL) 10 MG 24 hr tablet [id number] Yes Take 1 tablet (10 mg total) by mouth daily with breakfast. [name], FNP Taking Active   Glucose Blood (BLOOD GLUCOSE TEST STRIPS) STRP [id number] No 1 each by In Vitro route daily before breakfast. May substitute to any manufacturer covered by patient's insurance.  Patient not taking: Reported on 10/25/2023   [name], DO Not Taking Active   Lancet Device MISC [id number] No 1 each by Does not apply route daily before breakfast. May substitute to any manufacturer covered by patient's insurance.  Patient not taking: Reported on 10/25/2023   [name], DO Not Taking Active   Lancets Misc. MISC [id number] No 1 each by Does not apply route daily before breakfast. May substitute to any manufacturer covered by patient's insurance.  Patient not taking: Reported on 10/25/2023   [name], DO Not Taking Active   lisinopril (ZESTRIL) 5 MG tablet [id number] Yes Take 1 tablet (5 mg total) by mouth daily. [name], FNP Taking Active   rosuvastatin (CRESTOR) 40 MG tablet [id number] Yes Take 1 tablet (40 mg total) by mouth daily. [name], FNP Taking Active             Patient Active Problem List   Diagnosis Date Noted   GAD (generalized anxiety disorder) 10/18/2023   Attention and concentration deficit 10/18/2023   Primary osteoarthritis involving multiple joints 08/19/2023   Depression 08/19/2023   Hypertension associated with diabetes (HCC) 08/19/2023   Uncontrolled type 2 diabetes mellitus with hyperglycemia (HCC) 08/19/2023   Type 2 diabetes mellitus with diabetic neuropathy, without long-term current use of insulin (HCC) 08/19/2023   Tobacco use disorder, severe, dependence 08/19/2023   Pulmonary emphysema (HCC) 08/19/2023   Encounter for screening prostate specific antigen (PSA) measurement 08/19/2023   Annual physical exam 08/19/2023   History of colon polyps 08/19/2023   Encounter for screening for HIV 08/19/2023    Encounter for hepatitis C screening test for low risk patient 08/19/2023   Cutaneous abscess of abdominal wall 08/19/2023   Hyperlipidemia associated with type 2 diabetes mellitus (HCC) 09/16/2017   Barrett's esophagus with dysplasia 02/19/2015    Conditions to be addressed/monitored per PCP order:  DMII  Care Plan : RN Care Manager Plan of Care  Updates made by Heidi Dach, RN since 10/25/2023 12:00 AM     Problem: Health Management needs related to DM and Pain      Long-Range Goal: Development of Plan of Care to address Health Management needs related to DM and Pain   Start Date: 08/24/2023  Expected End Date: 11/22/2023  Note:   Current Barriers:  Chronic Disease Management support and education needs related to DMII and Pain   RNCM Clinical Goal(s):  Patient will verbalize understanding of plan for management of DMII and Pain as evidenced by Patient reports take all medications exactly as prescribed and will call provider for medication related questions as evidenced by patient reports and EMR attend all scheduled medical appointments: [facility] PT on 10/28/23, Pulmonology on 11/03/23, 11/08/23 for Lung CT, [facility] on 11/10/23, PCP on 11/17/23 and [facility] on 11/25/23 as evidenced by provider documentation  through collaboration with RN Care manager, provider, and care team.  Interventions: Evaluation of current treatment plan related to  self management and patient's adherence to plan as established by provider Provided patient with [place] [employer] transportation information, advised to call [phone], 2-3 days prior to appointment to schedule transportation   Diabetes Interventions:  (Status:  Goal on track:  Yes.) Long Term Goal Assessed patient's understanding of A1c goal: <7% Provided education to patient about basic DM disease process Reviewed medications with patient and discussed importance of medication adherence Counseled on importance of regular  laboratory monitoring as prescribed Discussed plans with patient for ongoing care management follow up and provided patient with direct contact information for care management team Reviewed scheduled/upcoming provider appointments including: [facility] on 10/28/23, Pulmonology on 11/03/23, 11/08/23 for Lung CT, [facility] on 11/10/23, PCP on 11/17/23 and [facility] on 11/25/23 Review of patient status, including review of consultants reports, relevant laboratory and other test results, and medications completed Assessed social determinant of health barriers Provided patient with information to [facility] [phone], advised to contact regarding charge account for needed prescriptions-revisited Reviewed member benefits provided by [employer], advised patient to call member services [phone] to inquire(My Health Pays, [employer] Diabetes Prevention, OTC products, Food rewards card)-revisited Discussed recent eye exam at [facility]-received new glasses Provided patient with education on checking BS Discussed referral to Diabetic Education, patient is interested Collaborated with PCP, requesting referral to Diabetic Education Lab Results  Component Value Date   HGBA1C 9.9 (H) 08/19/2023    Pain Interventions:  (Status:  Goal on track:  Yes.) Long Term Goal Pain assessment performed Medications reviewed Reviewed provider established plan for pain management Discussed importance of adherence to all scheduled medical appointments Counseled on the importance of reporting any/all new or changed pain symptoms or management strategies to pain management provider Advised patient to report to care team affect of pain on daily activities Discussed use of relaxation techniques and/or diversional activities to assist with pain reduction (distraction, imagery, relaxation, massage, acupressure, TENS, heat, and cold application Assessed social determinant of health  barriers Discussed appointment with [facility] for PT on 10/28/23 Reviewed provider notes and discussed the importance of lowering A1C prior to getting gel injections  Patient Goals/Self-Care Activities: Take all medications as prescribed Attend all scheduled provider appointments Call provider office for new concerns or questions  keep appointment with eye doctor drink 6 to 8 glasses of water each day fill half of plate with vegetables manage portion size Include a lean protein with each meal/snack Cut out soda and candy  Follow Up Plan:  Telephone follow up appointment with care management team member scheduled for:  10/25/23 at 9am      Follow Up:  Patient agrees to Care Plan and Follow-up.  Plan: The Managed Medicaid care management team will reach out to the patient again over the next 30 days.  Date/time of next scheduled RN care management/care coordination outreach:  11/26/23 at 9am  [name] RN, BSN [place]  Value-Based Care Institute [facility] Health RN Care Manager [phone]

## 2023-10-25 NOTE — Patient Instructions (Signed)
 Visit Information  Karl Barrett was given information about Medicaid Managed Care team care coordination services as a part of their [place] [employer] Medicaid benefit. Karl Barrett verbally consented to engagement with the [facility] Managed Care team.   If you are experiencing a medical emergency, please call 911 or report to your local emergency department or urgent care.   If you have a non-emergency medical problem during routine business hours, please contact your provider's office and ask to speak with a nurse.   For questions related to your [place] [employer] Medicaid health plan, please call: [phone]  If you would like to schedule transportation through your [place] [employer] Medicaid plan, please call the following number at least 2 days in advance of your appointment: [phone].   There is no limit to the number of trips during the year between medical appointments, healthcare facilities, or pharmacies. Transportation must be scheduled at least 2 business days before but not more than thirty 30 days before of your appointment.  Call the Behavioral Health Crisis Line at [phone], at any time, 24 hours a day, 7 days a week. If you are in danger or need immediate medical attention call 911.  If you would like help to quit smoking, call 1-800-QUIT-NOW (707-117-1862) OR Espaol: 1-855-Djelo-Ya (9-563-875-6433) o para ms informacin haga clic aqu or Text READY to 295-188 to register via text  Karl Barrett - following are the goals we discussed in your visit today:   Please see education materials related to Diabetes provided as print materials.   The patient verbalized understanding of instructions, educational materials, and care plan provided today and agreed to receive a mailed copy of patient instructions, educational materials, and care plan.   Telephone follow up appointment with Managed Medicaid care management team member scheduled for:11/26/23 at 9am  Karl Emms  RN, BSN   Value-Based Care Institute [facility] Health RN Care Manager [phone]    Following is a copy of your plan of care:  Care Plan : RN Care Manager Plan of Care  Updates made by Karl Dach, RN since 10/25/2023 12:00 AM     Problem: Health Management needs related to DM and Pain      Long-Range Goal: Development of Plan of Care to address Health Management needs related to DM and Pain   Start Date: 08/24/2023  Expected End Date: 11/22/2023  Note:   Current Barriers:  Chronic Disease Management support and education needs related to DMII and Pain   RNCM Clinical Goal(s):  Patient will verbalize understanding of plan for management of DMII and Pain as evidenced by Patient reports take all medications exactly as prescribed and will call provider for medication related questions as evidenced by patient reports and EMR attend all scheduled medical appointments: [facility] on 10/28/23, Pulmonology on 11/03/23, 11/08/23 for Lung CT, [facility] on 11/10/23, PCP on 11/17/23 and [facility] on 11/25/23 as evidenced by provider documentation  through collaboration with RN Care manager, provider, and care team.   Interventions: Evaluation of current treatment plan related to  self management and patient's adherence to plan as established by provider Provided patient with [employer] transportation information, advised to call [phone], 2-3 days prior to appointment to schedule transportation   Diabetes Interventions:  (Status:  Goal on track:  Yes.) Long Term Goal Assessed patient's understanding of A1c goal: <7% Provided education to patient about basic DM disease process Reviewed medications with patient and discussed importance of medication adherence Counseled on importance of  regular laboratory monitoring as prescribed Discussed plans with patient for ongoing care management follow up and provided patient with direct contact information for care  management team Reviewed scheduled/upcoming provider appointments including: [facility] on 10/28/23, Pulmonology on 11/03/23, 11/08/23 for Lung CT, [facility] on 11/10/23, PCP on 11/17/23 and [facility] on 11/25/23 Review of patient status, including review of consultants reports, relevant laboratory and other test results, and medications completed Assessed social determinant of health barriers Provided patient with information to [facility] [phone], advised to contact regarding charge account for needed prescriptions-revisited Reviewed member benefits provided by [employer], advised patient to call member services [phone] to inquire(My Health Pays, [employer] Diabetes Prevention, OTC products, Food rewards card)-revisited Discussed recent eye exam at [facility]-received new glasses Provided patient with education on checking BS Discussed referral to Diabetic Education, patient is interested Collaborated with PCP, requesting referral to Diabetic Education Lab Results  Component Value Date   HGBA1C 9.9 (H) 08/19/2023    Pain Interventions:  (Status:  Goal on track:  Yes.) Long Term Goal Pain assessment performed Medications reviewed Reviewed provider established plan for pain management Discussed importance of adherence to all scheduled medical appointments Counseled on the importance of reporting any/all new or changed pain symptoms or management strategies to pain management provider Advised patient to report to care team affect of pain on daily activities Discussed use of relaxation techniques and/or diversional activities to assist with pain reduction (distraction, imagery, relaxation, massage, acupressure, TENS, heat, and cold application Assessed social determinant of health barriers Discussed appointment with [facility] for PT on 10/28/23 Reviewed provider notes and discussed the importance of lowering A1C prior to getting gel  injections  Patient Goals/Self-Care Activities: Take all medications as prescribed Attend all scheduled provider appointments Call provider office for new concerns or questions  keep appointment with eye doctor drink 6 to 8 glasses of water each day fill half of plate with vegetables manage portion size Include a lean protein with each meal/snack Cut out soda and candy  Follow Up Plan:  Telephone follow up appointment with care management team member scheduled for:  10/25/23 at 9am

## 2023-10-29 ENCOUNTER — Other Ambulatory Visit: Payer: Self-pay | Admitting: Family Medicine

## 2023-10-29 DIAGNOSIS — E1140 Type 2 diabetes mellitus with diabetic neuropathy, unspecified: Secondary | ICD-10-CM

## 2023-11-01 ENCOUNTER — Other Ambulatory Visit: Payer: Self-pay | Admitting: Psychiatry

## 2023-11-01 DIAGNOSIS — F330 Major depressive disorder, recurrent, mild: Secondary | ICD-10-CM

## 2023-11-03 ENCOUNTER — Ambulatory Visit (INDEPENDENT_AMBULATORY_CARE_PROVIDER_SITE_OTHER): Payer: BC Managed Care – PPO | Admitting: Physician Assistant

## 2023-11-03 DIAGNOSIS — Z91199 Patient's noncompliance with other medical treatment and regimen due to unspecified reason: Secondary | ICD-10-CM

## 2023-11-03 NOTE — Progress Notes (Signed)
 Could not reach patient for [facility] visit.  Darcella Gasman Addalynne Golding, PA-C

## 2023-11-08 ENCOUNTER — Encounter: Payer: Self-pay | Admitting: Adult Health

## 2023-11-08 ENCOUNTER — Ambulatory Visit
Admission: RE | Admit: 2023-11-08 | Discharge: 2023-11-08 | Disposition: A | Discharge status: Home / Self Care | Source: Ambulatory Visit | Attending: Acute Care | Admitting: Acute Care

## 2023-11-08 ENCOUNTER — Ambulatory Visit: Admitting: Adult Health

## 2023-11-08 ENCOUNTER — Ambulatory Visit: Payer: BC Managed Care – PPO

## 2023-11-08 DIAGNOSIS — Z122 Encounter for screening for malignant neoplasm of respiratory organs: Secondary | ICD-10-CM | POA: Diagnosis present

## 2023-11-08 DIAGNOSIS — Z87891 Personal history of nicotine dependence: Secondary | ICD-10-CM | POA: Diagnosis present

## 2023-11-08 DIAGNOSIS — F17210 Nicotine dependence, cigarettes, uncomplicated: Secondary | ICD-10-CM

## 2023-11-08 NOTE — Patient Instructions (Signed)

## 2023-11-08 NOTE — Progress Notes (Signed)
  Virtual Visit via Telephone Note  I connected with Karl Barrett , 11/08/23 10:00 AM by a telemedicine application and verified that I am speaking with the correct person using two identifiers.  Location: Patient: home Provider: home   I discussed the limitations of evaluation and management by telemedicine and the availability of in person appointments. The patient expressed understanding and agreed to proceed.   Shared Decision Making Visit Lung Cancer Screening Program [phone])   Eligibility: 53 y.o. Pack Years Smoking History Calculation = 40 pack years  (# packs/per year x # years smoked) Recent History of coughing up blood  no Unexplained weight loss? no ( >Than 15 pounds within the last 6 months ) Prior History Lung / other cancer no (Diagnosis within the last 5 years already requiring surveillance chest CT Scans). Smoking Status Current Smoker  Visit Components: Discussion included one or more decision making aids. YES Discussion included risk/benefits of screening. YES Discussion included potential follow up diagnostic testing for abnormal scans. YES Discussion included meaning and risk of over diagnosis. YES Discussion included meaning and risk of False Positives. YES Discussion included meaning of total radiation exposure. YES  Counseling Included: Importance of adherence to annual lung cancer LDCT screening. YES Impact of comorbidities on ability to participate in the program. YES Ability and willingness to under diagnostic treatment. YES  Smoking Cessation Counseling: Current Smokers:  Discussed importance of smoking cessation. no Information about tobacco cessation classes and interventions provided to patient. no Patient provided with "ticket" for LDCT Scan. no Symptomatic Patient. NO Diagnosis Code: Tobacco Use Z72.0 Asymptomatic Patient yes  Counseling (Intermediate counseling: > three minutes counseling) V7846 (CT Chest Lung Cancer Screening Low  Dose W/O CM) [id number]  Z12.2-Screening of respiratory organs Z87.891-Personal history of nicotine dependence   [name] 11/08/23

## 2023-11-17 ENCOUNTER — Ambulatory Visit (INDEPENDENT_AMBULATORY_CARE_PROVIDER_SITE_OTHER): Payer: BC Managed Care – PPO | Admitting: Family Medicine

## 2023-11-17 ENCOUNTER — Encounter: Payer: Self-pay | Admitting: Family Medicine

## 2023-11-17 VITALS — BP 109/67 | HR 67 | Ht 73.0 in | Wt 205.0 lb

## 2023-11-17 DIAGNOSIS — D582 Other hemoglobinopathies: Secondary | ICD-10-CM | POA: Diagnosis not present

## 2023-11-17 DIAGNOSIS — F17200 Nicotine dependence, unspecified, uncomplicated: Secondary | ICD-10-CM

## 2023-11-17 DIAGNOSIS — J439 Emphysema, unspecified: Secondary | ICD-10-CM

## 2023-11-17 DIAGNOSIS — Z716 Tobacco abuse counseling: Secondary | ICD-10-CM

## 2023-11-17 DIAGNOSIS — B351 Tinea unguium: Secondary | ICD-10-CM

## 2023-11-17 DIAGNOSIS — E1140 Type 2 diabetes mellitus with diabetic neuropathy, unspecified: Secondary | ICD-10-CM | POA: Diagnosis not present

## 2023-11-17 DIAGNOSIS — M150 Primary generalized (osteo)arthritis: Secondary | ICD-10-CM

## 2023-11-17 MED ORDER — CICLOPIROX 8 % EX SOLN
Freq: Every day | CUTANEOUS | 0 refills | Status: AC
  Filled 2023-12-22: qty 6.6, 30d supply, fill #0

## 2023-11-17 NOTE — Assessment & Plan Note (Signed)
 Not using inhalers currently. Clarified which medication was for every day use (symbicort) and which was as needed (albuterol)

## 2023-11-17 NOTE — Progress Notes (Unsigned)
 Established patient visit   Patient: Karl Barrett   DOB: [birth year]   53 y.o. Male  MRN: [id number] Visit Date: 11/17/2023  Today's healthcare provider: [name], DO   Chief Complaint  Patient presents with   Establish Care    Discuss mood/ depression    Subjective    HPI Karl Barrett is a 53 year old male with arthritis who presents for a three-month follow-up.  He has arthritis with significant swelling primarily in his knees, affecting his ability to work. Swelling is also present in his hands and shoulders, with x-rays indicating minimal cartilage in his shoulders. He describes a knot swelling around his neck, initially thought to be related to lymph nodes. He recently had a CT scan of the chest and is awaiting results.  He is experiencing significant life stressors, including living at his nephew's house and sleeping on a chair with an ottoman. He is undergoing vocational rehabilitation due to his inability to continue his previous job of over thirty years because of arthritis. He reports financial difficulties, including being two months behind on the power bill and having his car insurance canceled. He also mentions challenges with communication due to lack of a phone and limited access to the internet.  He is currently taking medications but initially had issues obtaining them due to financial constraints. He is on Symbicort daily and albuterol as needed. He has also started bupropion at a low dose and has a history of using bupropion to quit smoking, which was successful for three years in the past. He is currently smoking and wants to quit.  He endorses a history of shingles, which he believes he experienced in elementary school, and describes it as painful. He reports numbness and tingling in the bottom of his feet and does not regularly check his blood sugars. He has tight hamstrings and occasional ankle problems, with a history of injuries to both  ankles.  When informed of his historically high hemoglobin level of 18, with the upper limit of normal being 16, he states he wonders wonders if his previous profession, which involved exposure to metal dust, could be related to this finding.  He is receiving food stamps, but they only last about a week and a half due to high grocery prices. He finds it challenging to choose healthy foods as a diabetic. He has difficulty traveling due to limited gas and financial resources.     Medications: Outpatient Medications Prior to Visit  Medication Sig   albuterol (VENTOLIN HFA) 108 (90 Base) MCG/ACT inhaler INHALE 1-2 PUFFS BY MOUTH EVERY 6 HOURS AS NEEDED FOR WHEEZE OR SHORTNESS OF BREATH   Blood Glucose Monitoring Suppl DEVI 1 each by Does not apply route daily before breakfast. May substitute to any manufacturer covered by patient's insurance.   budesonide-formoterol (SYMBICORT) 160-4.5 MCG/ACT inhaler Inhale 2 puffs into the lungs 2 (two) times daily.   buPROPion (WELLBUTRIN) 75 MG tablet TAKE 1 TABLET (75 MG TOTAL) BY MOUTH IN THE MORNING   empagliflozin (JARDIANCE) 25 MG TABS tablet Take 1 tablet (25 mg total) by mouth daily.   escitalopram (LEXAPRO) 10 MG tablet Take 1 tablet (10 mg total) by mouth at bedtime.   Glucose Blood (BLOOD GLUCOSE TEST STRIPS) STRP 1 each by In Vitro route daily before breakfast. May substitute to any manufacturer covered by patient's insurance.   Lancet Device MISC 1 each by Does not apply route daily before breakfast. May substitute to any  manufacturer covered by [employer].   lisinopril (ZESTRIL) 5 MG tablet Take 1 tablet (5 mg total) by mouth daily.   rosuvastatin (CRESTOR) 40 MG tablet Take 1 tablet (40 mg total) by mouth daily.   [DISCONTINUED] glipiZIDE (GLUCOTROL XL) 10 MG 24 hr tablet Take 1 tablet (10 mg total) by mouth daily with breakfast.   No facility-administered medications prior to visit.        Objective    BP 109/67   Pulse 67    Ht 6\' 1"  (1.854 m)   Wt 205 lb (93 kg)   SpO2 97%   BMI 27.05 kg/m     Physical Exam Constitutional:      Appearance: Normal appearance.  HENT:     Head: Normocephalic and atraumatic.  Eyes:     General: No scleral icterus.    Conjunctiva/sclera: Conjunctivae normal.  Cardiovascular:     Pulses:          Dorsalis pedis pulses are 2+ on the right side and 2+ on the left side.       Posterior tibial pulses are 2+ on the right side and 2+ on the left side.  Musculoskeletal:     Right foot: Normal range of motion. No deformity, bunion, Charcot foot, foot drop or prominent metatarsal heads.     Left foot: Normal range of motion. No deformity, bunion, Charcot foot, foot drop or prominent metatarsal heads.  Feet:     Right foot:     Protective Sensation: 10 sites tested.  7 sites sensed.     Skin integrity: No ulcer, blister, skin breakdown, erythema, warmth, callus, dry skin or fissure.     Toenail Condition: Fungal disease present.    Left foot:     Protective Sensation: 10 sites tested.  9 sites sensed.     Skin integrity: No ulcer, blister, skin breakdown, erythema, warmth, callus, dry skin or fissure.     Toenail Condition: Fungal disease present. Neurological:     Mental Status: He is alert and oriented to person, place, and time. Mental status is at baseline.  Psychiatric:        Mood and Affect: Mood normal.        Behavior: Behavior normal.      Results for orders placed or performed in visit on 11/17/23  Comprehensive metabolic panel  Result Value Ref Range   Glucose 178 (H) 70 - 99 mg/dL   BUN 12 6 - 24 mg/dL   Creatinine, Ser 8.11 0.76 - 1.27 mg/dL   eGFR 914 >78 GN/FAO/1.30   BUN/Creatinine Ratio 16 9 - 20   Sodium 137 134 - 144 mmol/L   Potassium 4.5 3.5 - 5.2 mmol/L   Chloride 99 96 - 106 mmol/L   CO2 22 20 - 29 mmol/L   Calcium 9.6 8.7 - 10.2 mg/dL   Total Protein 6.5 6.0 - 8.5 g/dL   Albumin 4.3 3.8 - 4.9 g/dL   Globulin, Total 2.2 1.5 - 4.5 g/dL    Bilirubin Total 0.5 0.0 - 1.2 mg/dL   Alkaline Phosphatase 82 44 - 121 IU/L   AST 15 0 - 40 IU/L   ALT 21 0 - 44 IU/L  Hemoglobin A1c  Result Value Ref Range   Hgb A1c MFr Bld 8.4 (H) 4.8 - 5.6 %   Est. average glucose Bld gHb Est-mCnc 194 mg/dL  Lipid panel  Result Value Ref Range   Cholesterol, Total 157 100 - 199 mg/dL   Triglycerides 865  0 - 149 mg/dL   HDL 48 >40 mg/dL   VLDL Cholesterol Cal 19 5 - 40 mg/dL   LDL Chol Calc (NIH) 90 0 - 99 mg/dL   Chol/HDL Ratio 3.3 0.0 - 5.0 ratio    Assessment & Plan    Type 2 diabetes mellitus with diabetic neuropathy, without long-term current use of insulin (HCC) -     Comprehensive metabolic panel with GFR -     Hemoglobin A1c -     Lipid panel  Pulmonary emphysema, unspecified emphysema type (HCC) Assessment & Plan: Not using inhalers currently. Clarified which medication was for every day use (symbicort) and which was as needed (albuterol)   Onychomycosis of great toe -     Ciclopirox; Apply topically at bedtime. Apply over nail and surrounding skin. Apply daily over previous coat. After seven (7) days, may remove with alcohol and continue cycle.  Dispense: 6.6 mL; Refill: 0  Elevated hemoglobin (HCC) -     Ambulatory referral to Hematology / Oncology  Encounter for smoking cessation counseling  Tobacco use disorder, severe, dependence  Primary osteoarthritis involving multiple joints    Diabetes Mellitus with diabetic neuropathy Diabetes management hindered by financial constraints. Possible diabetic neuropathy indicated by foot numbness. Lack of glucose monitoring due to insurance issues. - Resend prescription for glucose meter. - Order A1c test for next week.  Pulmonary emphysema Emphysema managed with Symbicort and albuterol. Confusion about inhaler use due to disorganization. - Educate on Symbicort daily use and albuterol as needed. - Ensure prescriptions sent to correct pharmacy.  Tobacco Use Disorder Chronic  smoking with interest in quitting. Previous success with bupropion. Current stressors may affect quitting ability. - Discuss smoking cessation options, including nicotine patches. - Provide contact information for smoking cessation support services. - Continue bupropion for smoking cessation and depression  Polycythemia Prolonged history of elevated hemoglobin raises concern for hemochromatosis versus secondary causes like smoking or metal dust exposure. - Refer to hematology for further evaluation.  Onychomycosis Thickened toenails previously treated with Lamisil. Concerns about liver impact from oral antifungals. Topical treatment preferred. - Prescribe topical antifungal paint for toenails, apply daily at bedtime.  Arthritis Chronic arthritis limits daily activities and employment. X-rays show limited knee cartilage. Vocational rehabilitation needed due to job limitations. - Continue vocational rehabilitation. - Consider referral to occupational therapy.  General Health Maintenance Due for pneumonia and shingles vaccines. Previous shingles in childhood. - Recommended Prevnar 20 or 21 vaccine; patient declines at this time. - Discuss shingles vaccine options; patient declines at this time.   Return in about 3 months (around 02/28/2024) for DM, Anx/Dep.      I discussed the assessment and treatment plan with the patient  The patient was provided an opportunity to ask questions and all were answered. The patient agreed with the plan and demonstrated an understanding of the instructions.   The patient was advised to call back or seek an in-person evaluation if the symptoms worsen or if the condition fails to improve as anticipated.    [name], DO  [facility] [phone] (phone) [phone] (fax)  [facility]

## 2023-11-23 ENCOUNTER — Other Ambulatory Visit: Payer: Self-pay | Admitting: Family Medicine

## 2023-11-23 DIAGNOSIS — E1165 Type 2 diabetes mellitus with hyperglycemia: Secondary | ICD-10-CM

## 2023-11-23 MED ORDER — GLIPIZIDE ER 10 MG PO TB24
10.0000 mg | ORAL_TABLET | Freq: Every day | ORAL | 0 refills | Status: DC
  Filled 2023-12-22: qty 90, 90d supply, fill #0

## 2023-11-23 NOTE — Telephone Encounter (Signed)
[facility] faxed refill request for the following medications:  glipiZIDE (GLUCOTROL XL) 10 MG 24 hr tablet   Please advise.

## 2023-11-25 ENCOUNTER — Ambulatory Visit: Payer: Medicaid Other | Admitting: General Surgery

## 2023-11-26 ENCOUNTER — Telehealth: Payer: Self-pay | Admitting: *Deleted

## 2023-11-26 ENCOUNTER — Other Ambulatory Visit: Payer: Self-pay | Admitting: *Deleted

## 2023-11-26 NOTE — Patient Outreach (Signed)
  Medicaid Managed Care   Unsuccessful Attempt Note   11/26/2023 Name: Karl Barrett MRN: [id number] DOB: 08-Apr-1971  Referred by: [name], DO Reason for referral : High Risk Managed Medicaid (Unsuccessful RNCM follow up telephone outreach)   An unsuccessful telephone outreach was attempted today. The patient was referred to the case management team for assistance with care management and care coordination.    Follow Up Plan: A HIPAA compliant phone message was left for the patient providing contact information and requesting a return call. and The Managed Medicaid care management team will reach out to the patient again over the next 7 days.    [name] RN, BSN [place]  [facility]: [facility] RN Care Manager [phone]

## 2023-11-26 NOTE — Patient Instructions (Signed)
 Visit Information  Mr. Akshar Starnes  - as a part of your Medicaid benefit, you are eligible for care management and care coordination services at no cost or copay. I was unable to reach you by phone today but would be happy to help you with your health related needs. Please feel free to call me @ [phone].   A member of the Managed Medicaid care management team will reach out to you again over the next 7 days.   [name] RN, BSN [place]  Value[facility]Based Care Institute [facility] RN Care Manager [phone]

## 2023-11-26 NOTE — Patient Outreach (Signed)
  Care Management   Note  11/26/2023 Name: Karl Barrett MRN: [id number] DOB: 19-Jul-1971  Karl Barrett is enrolled in a Managed Medicaid plan: Yes. Outreach attempt today was unsuccessful.   The care management team will reach out to the patient again over the next 7 days.   [name] RN, BSN [place]  Value-Based Care Institute [facility] RN Care Manager [phone]

## 2023-11-27 LAB — COMPREHENSIVE METABOLIC PANEL WITH GFR
ALT: 21 IU/L (ref 0–44)
AST: 15 IU/L (ref 0–40)
Albumin: 4.3 g/dL (ref 3.8–4.9)
Alkaline Phosphatase: 82 IU/L (ref 44–121)
BUN/Creatinine Ratio: 16 (ref 9–20)
BUN: 12 mg/dL (ref 6–24)
Bilirubin Total: 0.5 mg/dL (ref 0.0–1.2)
CO2: 22 mmol/L (ref 20–29)
Calcium: 9.6 mg/dL (ref 8.7–10.2)
Chloride: 99 mmol/L (ref 96–106)
Creatinine, Ser: 0.76 mg/dL (ref 0.76–1.27)
Globulin, Total: 2.2 g/dL (ref 1.5–4.5)
Glucose: 178 mg/dL — ABNORMAL HIGH (ref 70–99)
Potassium: 4.5 mmol/L (ref 3.5–5.2)
Sodium: 137 mmol/L (ref 134–144)
Total Protein: 6.5 g/dL (ref 6.0–8.5)
eGFR: 106 mL/min/{1.73_m2} (ref >59)

## 2023-11-27 LAB — LIPID PANEL
Chol/HDL Ratio: 3.3 ratio (ref 0.0–5.0)
Cholesterol, Total: 157 mg/dL (ref 100–199)
HDL: 48 mg/dL (ref >39)
LDL Chol Calc (NIH): 90 mg/dL (ref 0–99)
Triglycerides: 101 mg/dL (ref 0–149)
VLDL Cholesterol Cal: 19 mg/dL (ref 5–40)

## 2023-11-27 LAB — HEMOGLOBIN A1C
Est. average glucose Bld gHb Est-mCnc: 194 mg/dL
Hgb A1c MFr Bld: 8.4 % — ABNORMAL HIGH (ref 4.8–5.6)

## 2023-11-29 ENCOUNTER — Encounter: Payer: Self-pay | Admitting: Oncology

## 2023-11-29 ENCOUNTER — Inpatient Hospital Stay

## 2023-11-29 ENCOUNTER — Inpatient Hospital Stay: Attending: Oncology | Admitting: Oncology

## 2023-11-29 ENCOUNTER — Telehealth: Payer: Self-pay

## 2023-11-29 VITALS — BP 110/72 | HR 63 | Temp 96.2°F | Resp 16 | Ht 73.0 in | Wt 206.2 lb

## 2023-11-29 DIAGNOSIS — E1140 Type 2 diabetes mellitus with diabetic neuropathy, unspecified: Secondary | ICD-10-CM | POA: Insufficient documentation

## 2023-11-29 DIAGNOSIS — F17210 Nicotine dependence, cigarettes, uncomplicated: Secondary | ICD-10-CM | POA: Insufficient documentation

## 2023-11-29 DIAGNOSIS — I10 Essential (primary) hypertension: Secondary | ICD-10-CM | POA: Insufficient documentation

## 2023-11-29 DIAGNOSIS — D751 Secondary polycythemia: Secondary | ICD-10-CM | POA: Insufficient documentation

## 2023-11-29 DIAGNOSIS — Z794 Long term (current) use of insulin: Secondary | ICD-10-CM | POA: Diagnosis not present

## 2023-11-29 DIAGNOSIS — Z79899 Other long term (current) drug therapy: Secondary | ICD-10-CM | POA: Diagnosis not present

## 2023-11-29 LAB — CBC WITH DIFFERENTIAL/PLATELET
Abs Immature Granulocytes: 0.02 10*3/uL (ref 0.00–0.07)
Basophils Absolute: 0.1 10*3/uL (ref 0.0–0.1)
Basophils Relative: 1 %
Eosinophils Absolute: 0.1 10*3/uL (ref 0.0–0.5)
Eosinophils Relative: 1 %
HCT: 53.9 % — ABNORMAL HIGH (ref 39.0–52.0)
Hemoglobin: 18.1 g/dL — ABNORMAL HIGH (ref 13.0–17.0)
Immature Granulocytes: 0 %
Lymphocytes Relative: 21 %
Lymphs Abs: 1.6 10*3/uL (ref 0.7–4.0)
MCH: 32 pg (ref 26.0–34.0)
MCHC: 33.6 g/dL (ref 30.0–36.0)
MCV: 95.4 fL (ref 80.0–100.0)
Monocytes Absolute: 0.6 10*3/uL (ref 0.1–1.0)
Monocytes Relative: 8 %
Neutro Abs: 5.5 10*3/uL (ref 1.7–7.7)
Neutrophils Relative %: 69 %
Platelets: 162 10*3/uL (ref 150–400)
RBC: 5.65 MIL/uL (ref 4.22–5.81)
RDW: 11.9 % (ref 11.5–15.5)
WBC: 7.9 10*3/uL (ref 4.0–10.5)
nRBC: 0 % (ref 0.0–0.2)

## 2023-11-29 LAB — URINALYSIS, COMPLETE (UACMP) WITH MICROSCOPIC
Bacteria, UA: NONE SEEN
Bilirubin Urine: NEGATIVE
Glucose, UA: >=500 mg/dL — AB
Ketones, ur: NEGATIVE mg/dL
Leukocytes,Ua: NEGATIVE
Nitrite: NEGATIVE
Protein, ur: NEGATIVE mg/dL
Specific Gravity, Urine: 1.032 — ABNORMAL HIGH (ref 1.005–1.030)
Squamous Epithelial / HPF: 0 /HPF (ref 0–5)
pH: 5 (ref 5.0–8.0)

## 2023-11-29 NOTE — Telephone Encounter (Signed)
 Clinical Social Work was referred by medical provider for assessment of psychosocial needs.  CSW attempted to contact patient by phone.  Left voicemail with contact information and request for return call.
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
## 2023-11-30 ENCOUNTER — Encounter: Payer: Self-pay | Admitting: Family Medicine

## 2023-11-30 LAB — TESTOSTERONE: Testosterone: 420 ng/dL (ref 264–916)

## 2023-11-30 LAB — ERYTHROPOIETIN: Erythropoietin: 3.5 m[IU]/mL (ref 2.6–18.5)

## 2023-12-02 ENCOUNTER — Other Ambulatory Visit: Payer: Self-pay | Admitting: Pharmacist

## 2023-12-02 ENCOUNTER — Telehealth: Payer: Self-pay

## 2023-12-02 LAB — JAK2 GENOTYPR

## 2023-12-02 NOTE — Telephone Encounter (Signed)
-----   Message from Nurse Shawna Orleans R sent at 11/26/2023  9:11 AM EDT ----- Ok Edwards, I had an unsuccessful f/u outreach with Mr. Kinnard today. Please attempt to reschedule. Thank you

## 2023-12-02 NOTE — Progress Notes (Addendum)
 12/02/2023 Name: Karl Barrett MRN: [id number] DOB: 11-24-1970  Chief Complaint  Patient presents with   Diabetes    Karl Barrett is a 53 y.o. year old male who presented for a telephone visit.   They were referred to the pharmacist by their PCP for assistance in managing diabetes and complex medication management.    Subjective:  Care Team: Primary Care Provider: [name], DO ; Next Scheduled Visit: 02/23/24 Clinical Pharmacist: Karl Barrett, PharmD  Medication Access/Adherence  Current Pharmacy:  [phone] [place] Rough, [place] - [address] [place] [address] Phone: [phone] Fax: [phone]  [facility] [address] [place] [address] Phone: [phone] Fax: [phone]   Patient reports affordability concerns with their medications: Yes - had to borrow money to get medications Patient reports access/transportation concerns to their pharmacy: Yes - can't pay insurance + cars won't pass inspection Patient reports adherence concerns with their medications:  No    *Living with nephew Karl Barrett currently in mobile home- triple bypass recently (no job either); daughter (22 yrs) living with them as well with new daycare job or driver's license   Diabetes:  Current medications: Glipizide XL 10mg  daily, Jardiance 25mg  daily Medications tried in the past: Metformin (GI issues + nerve pain in legs)  Current glucose readings: N/A Using  meter; testing 0 times daily   Patient denies hypoglycemic s/sx including dizziness, shakiness, sweating. Patient denies hyperglycemic symptoms including polyuria, polydipsia, polyphagia, nocturia, neuropathy, blurred vision.  Current meal patterns:  - Breakfast: Frozen sausage biscuits, cereal - Lunch: Fast food - Supper: Meat + potatoes/corn, lasagna  - Snacks: Little Debbie, pudding - Drinks: Soda (Dr. 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  Current medication access support: Medicaid only- $4 copays   Objective:  Lab Results  Component Value Date   HGBA1C 8.4 (H) 11/26/2023    Lab Results  Component Value Date   CREATININE 0.76 11/26/2023   BUN 12 11/26/2023   NA 137 11/26/2023   K 4.5 11/26/2023   CL 99 11/26/2023   CO2 22 11/26/2023    Lab Results  Component Value Date   CHOL 157 11/26/2023   HDL 48 11/26/2023   LDLCALC 90 11/26/2023   TRIG 101 11/26/2023   CHOLHDL 3.3 11/26/2023    Medications Reviewed Today   Medications were not reviewed in this encounter       Assessment/Plan:   Diabetes: - Currently uncontrolled - Reviewed long term cardiovascular and renal outcomes of uncontrolled blood sugar - Reviewed goal A1c, goal fasting, and goal 2 hour post prandial glucose - Reviewed dietary modifications including cutting out sodas and sweets with snacks first - Reviewed lifestyle modifications including adding more exercise where possible - Was having stomach upset/diarrhea and had to pull back on some meds; doing them all now since last Monday and spacing them out some  - Advised Jardiance + Glipizide XL have to be in the morning; Lexapro at night per instructions   Follow Up Plan:  - Follow-up on 4/23 to see how BG readings are doing and add Trulicity/Ozempic if needed - Reports inconsistency with DM medications which has reflected on A1c- advised importance of consistency to get BG's down - Advised to use [facility] pharmacy for [facility] charge account if needed- had to borrow money this  month - Called pharmacy to get glucometer and testing supplies set-up for free; has not been testing  - Got meter for free from pharmacy with free meter program - Plans to start Bupropion on Friday and working on smoking cessation   *Of note, patient recently lost his job and has no  money- trying to organize appts and resources currently   Karl Barrett, PharmD [facility] Phone Number: [phone]

## 2023-12-04 LAB — NGS JAK2 E12-15/CALR/MPL

## 2023-12-06 ENCOUNTER — Telehealth: Payer: Self-pay

## 2023-12-06 ENCOUNTER — Inpatient Hospital Stay

## 2023-12-06 NOTE — Progress Notes (Signed)
 [facility] Clinical Social Work  Initial Assessment   Karl Barrett is a 53 y.o. year old male contacted by phone. Clinical Social Work was referred by medical provider for assessment of psychosocial needs.   SDOH (Social Determinants of Health) assessments performed: Yes SDOH Interventions    Flowsheet Row Office Visit from 11/29/2023 in [facility] . [facility] Office Visit from 11/17/2023 in [facility] Patient Outreach Telephone from 09/20/2023 in [place] [facility]ATION [facility]ALTH DEPARTMENT Patient Outreach Telephone from 08/30/2023 in [place] POPULATION HEALTH DEPARTMENT Telephone from 08/23/2023 in [place] POPULATION HEALTH DEPARTMENT Office Visit from 05/30/2021 in [facility] at [facility]  SDOH Interventions        Food Insecurity Interventions AMB Referral -- -- -- [id number] Referral, Community Resources Provided --  Housing Interventions AMB Referral -- -- -- [employer] Provided, Other (Comment)  [has lot rent and power and cable .] --  Transportation Interventions Intervention Not Indicated -- -- Programmer, applications Provided, Emergency planning/management officer Provided, [employer] (Specialized Community Area Transporation)  Almeda Jacobs information provided] --  Utilities Interventions AMB Referral -- -- -- Intervention Not Indicated  [Told about Low income energy assitance program would not be helpful as utilities are not in his] --  Depression Interventions/Treatment  -- Currently on Treatment, Counseling, Medication -- Programmer, applications Provided -- Cytogeneticist Strain Interventions -- -- -- Programmer, applications Provided -- --  Stress Interventions -- -- Programmer, applications Provided, Education officer, environmental Provided -- --       SDOH Screenings   Food Insecurity: Food Insecurity Present (11/29/2023)  Housing: High Risk (11/29/2023)  Transportation Needs:  Unmet Transportation Needs (11/29/2023)  Utilities: At Risk (11/29/2023)  Alcohol Screen: Low Risk  (11/17/2023)  Depression (PHQ2-9): Low Risk  (11/29/2023)  Recent Concern: Depression (PHQ2-9) - Medium Risk (11/17/2023)  Financial Resource Strain: High Risk (08/30/2023)  Social Connections: Unknown (08/23/2023)  Stress: Stress Concern Present (09/20/2023)  Tobacco Use: High Risk (11/30/2023)  Health Literacy: Adequate Health Literacy (11/17/2023)     Distress Screen completed: No     No data to display            Family/Social Information:  Housing Arrangement: patient lives with his nephew. Family members/support persons in your life? 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 Income source: Employment Financial concerns: Yes, current concerns Type of concern: Engineer, civil (consulting) access concerns: yes Religious or spiritual practice: No Advanced directives: No Services Currently in place:  Medicaid and food stamps.  Coping/ Adjustment to diagnosis: Patient understands treatment plan and what happens next? yes Concerns about diagnosis and/or treatment: Quality of life Patient reported stressors: Therapist, art and/or priorities: To find gainful employment. 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  Clinical Social Work Clinical Goal(s):  Explore community resource options for unmet needs related to:  Financial Strain   Interventions: Discussed common feeling and emotions when being diagnosed with cancer, and the importance of support during treatment Informed patient of the support team roles and support services at  [facility] Provided CSW contact information and encouraged patient to call with  any questions or concerns Provided patient with information about food pantries.  He stated he has a list of local resources.  Patient is currently working with [facility]. He is not eligible for [employer] at this time.   Follow Up Plan: Patient will contact CSW with any support or resource needs Patient verbalizes understanding of plan: Yes    [name], LCSW Clinical Social Worker [facility]  Patient is participating in a Managed Medicaid Plan:  Yes

## 2023-12-06 NOTE — Telephone Encounter (Signed)
 Clinical Social Work was referred by medical provider for assessment of psychosocial needs.  CSW attempted to contact patient by phone a second time.  Left voicemail with contact information and request for return call.

## 2023-12-07 NOTE — Progress Notes (Signed)
 Complex Care Management Note  Care Guide Note 12/07/2023 Name: Karl Barrett MRN: [id number] DOB: 03/24/1971  Karl Barrett is a 53 y.o. year old male who sees Pardue, Asencion Blacksmith, DO for primary care. I reached out to Karl Barrett by phone today to offer complex care management services.  Karl Barrett was given information about Complex Care Management services today including:   The Complex Care Management services include support from the care team which includes your Nurse Care Manager, Clinical Social Worker, or Pharmacist.  The Complex Care Management team is here to help remove barriers to the health concerns and goals most important to you. Complex Care Management services are voluntary, and the patient may decline or stop services at any time by request to their care team member.   Complex Care Management Consent Status: Patient did not agree to participate in complex care management services at this time.  Follow up plan:  Patient would like to continue to follow up with [facility] and follow up with PCP.   [name]  [facility] Care Management Assistant Direct Dial: [phone]  Fax: [phone]

## 2023-12-08 ENCOUNTER — Other Ambulatory Visit: Payer: Self-pay | Admitting: *Deleted

## 2023-12-08 NOTE — Patient Outreach (Signed)
   Care Management/Care Coordination  RN Case Manager Case Closure Note  12/08/2023 Name: Karl Barrett MRN: [id number] DOB: [birth year]  Karl Barrett is a 53 y.o. year old male who is a primary care patient of Carlean Charter, DO. The care management/care coordination team was consulted for assistance with chronic disease management and/or care coordination needs.   There are no care plans that you recently modified to display for this patient.   Plan: The patient declines further follow up and engagement by the care management team and the case will be closed. Appropriate care team members and provider have been notified via electronic communication and the patient has been provided contact information for the care management team. The care management team is available to at any time in the future should needs arise.   Arna Better RN, BSN [place]  [facility] RN Care Manager [phone]

## 2023-12-15 ENCOUNTER — Encounter: Payer: Self-pay | Admitting: Oncology

## 2023-12-15 ENCOUNTER — Other Ambulatory Visit: Payer: Self-pay

## 2023-12-15 ENCOUNTER — Telehealth: Payer: Self-pay | Admitting: Pharmacist

## 2023-12-15 ENCOUNTER — Other Ambulatory Visit: Payer: Self-pay | Admitting: Pharmacist

## 2023-12-15 ENCOUNTER — Inpatient Hospital Stay: Admitting: Oncology

## 2023-12-15 DIAGNOSIS — Z122 Encounter for screening for malignant neoplasm of respiratory organs: Secondary | ICD-10-CM

## 2023-12-15 DIAGNOSIS — Z87891 Personal history of nicotine dependence: Secondary | ICD-10-CM

## 2023-12-15 DIAGNOSIS — F17210 Nicotine dependence, cigarettes, uncomplicated: Secondary | ICD-10-CM

## 2023-12-15 NOTE — Progress Notes (Signed)
   12/15/2023  Patient ID: Karl Barrett, male   DOB: 10-27-1970, 53 y.o.   MRN: [id number]  Attempted to contact patient for scheduled appointment for diabetes management. Left HIPAA compliant message for patient to return my call at their convenience.    Will try again in 1 week.   Delvin File, PharmD [facility]  Phone Number: [phone]

## 2023-12-20 ENCOUNTER — Telehealth: Payer: Self-pay | Admitting: Family Medicine

## 2023-12-20 NOTE — Telephone Encounter (Signed)
 [facility] Jardiance  25 mg. #90 with refills

## 2023-12-22 ENCOUNTER — Telehealth: Payer: Self-pay | Admitting: Pharmacist

## 2023-12-22 ENCOUNTER — Ambulatory Visit: Admitting: Psychiatry

## 2023-12-22 ENCOUNTER — Other Ambulatory Visit: Payer: Self-pay

## 2023-12-22 ENCOUNTER — Encounter: Payer: Self-pay | Admitting: Psychiatry

## 2023-12-22 VITALS — BP 136/68 | HR 82 | Temp 98.4°F | Ht 73.0 in | Wt 206.2 lb

## 2023-12-22 DIAGNOSIS — F330 Major depressive disorder, recurrent, mild: Secondary | ICD-10-CM | POA: Diagnosis not present

## 2023-12-22 DIAGNOSIS — F411 Generalized anxiety disorder: Secondary | ICD-10-CM | POA: Diagnosis not present

## 2023-12-22 DIAGNOSIS — E1159 Type 2 diabetes mellitus with other circulatory complications: Secondary | ICD-10-CM

## 2023-12-22 DIAGNOSIS — R41840 Attention and concentration deficit: Secondary | ICD-10-CM | POA: Diagnosis not present

## 2023-12-22 DIAGNOSIS — E1165 Type 2 diabetes mellitus with hyperglycemia: Secondary | ICD-10-CM

## 2023-12-22 MED ORDER — ACCU-CHEK SOFTCLIX LANCETS MISC
1.0000 | Freq: Every day | 0 refills | Status: AC
  Filled 2023-12-22: qty 100, 90d supply, fill #0

## 2023-12-22 MED ORDER — HYDROXYZINE HCL 10 MG PO TABS
10.0000 mg | ORAL_TABLET | Freq: Three times a day (TID) | ORAL | 1 refills | Status: AC | PRN
  Filled 2023-12-22: qty 90, 30d supply, fill #0

## 2023-12-22 MED ORDER — BUPROPION HCL 75 MG PO TABS
75.0000 mg | ORAL_TABLET | Freq: Every morning | ORAL | 1 refills | Status: AC
  Filled 2023-12-22: qty 30, 30d supply, fill #0

## 2023-12-22 MED FILL — Albuterol Sulfate Inhal Aero 108 MCG/ACT (90MCG Base Equiv): RESPIRATORY_TRACT | 30 days supply | Qty: 18 | Fill #0 | Status: AC

## 2023-12-22 NOTE — Patient Instructions (Addendum)
 [email or web address]  www.[email or web address]  [email or web address] [facility] [address], [place], [place] [address]   [phone]  [facility] [email or web address] [address], [place], [place] [address]  [phone]  [facility] [email or web address] [address], [place], [place] [address]  [phone]   [facility] - [phone]  [facility] - [phone]  [facility] - [phone] counseling [phone]  [facility] [phone]   [email or web address] this clinician can offer telehealth and has a sliding scale option  [email or web address] this group also offers sliding scale rates and is based out of    [employer] [facility] has interns who offer sliding scale rates and some of the full time clinicians do, as well. You complete their contact form on their website and the referrals coordinator will help to get connected to someone   [email or web address] [phone]  Medicaid below :  [facility] [address] [place], [place] [address]  [phone]    Estela Held [address] [place], [place] [address]  [phone]    [facility] [address] [place], [place] [address]  [phone]   Hydroxyzine Capsules or Tablets What is this medication? HYDROXYZINE (hye DROX i zeen) treats the symptoms of allergies and allergic reactions. It may also be used to treat anxiety or cause drowsiness before a procedure. It works by blocking histamine, a substance released by the body during an allergic reaction. It belongs to a group of medications called antihistamines. This medicine may be used for other purposes;  ask your health care provider or pharmacist if you have questions. COMMON BRAND NAME(S): ANX, Atarax, Rezine, Vistaril What should I tell my care team before I take this medication? They need to know if you have any of these conditions: Glaucoma Heart disease Irregular heartbeat or rhythm Kidney disease Liver disease Lung or breathing disease, such as asthma Stomach or intestine problems Thyroid disease Trouble passing urine An unusual or allergic reaction to hydroxyzine, other medications, foods, dyes or preservatives Pregnant or trying to get pregnant Breastfeeding How should I use this medication? Take this medication by mouth with a full glass of water. Take it as directed on the prescription label at the same time every day. You can take it with or without food. If it upsets your stomach, take it with food. Talk to your care team about the use of this medication in children. While it may be prescribed for children as young as 6 years for selected conditions, precautions do apply. People 65 years and older may have a stronger reaction and need a smaller dose. Overdosage: If you think you have taken too much of this medicine contact a poison control center or emergency room at once. NOTE: This medicine is only for you. Do not share this medicine with others. What if I miss a dose? If you miss a dose, take it as soon as you can. If it is almost time for your next dose, take only that dose. Do not take double or extra doses. What may interact with this medication? Do not take this medication with any of the following: Cisapride Dronedarone Pimozide Thioridazine This medication may also interact with the following: Alcohol Antihistamines for allergy, cough, and cold Atropine Barbiturate medications for sleep or seizures, such as phenobarbital Certain antibiotics, such as  erythromycin or clarithromycin Certain medications for anxiety or sleep Certain medications for bladder  problems, such as oxybutynin or tolterodine Certain medications for irregular heartbeat Certain medications for mental health conditions Certain medications for Parkinson disease, such as benztropine, trihexyphenidyl Certain medications for seizures, such as phenobarbital or primidone Certain medications for stomach problems, such as dicyclomine or hyoscyamine Certain medications for travel sickness, such as scopolamine Ipratropium Opioid medications for pain Other medications that cause heart rhythm changes, such as dofetilide This list may not describe all possible interactions. Give your health care provider a list of all the medicines, herbs, non-prescription drugs, or dietary supplements you use. Also tell them if you smoke, drink alcohol, or use illegal drugs. Some items may interact with your medicine. What should I watch for while using this medication? Visit your care team for regular checks on your progress. Tell your care team if your symptoms do not start to get better or if they get worse. This medication may affect your coordination, reaction time, or judgment. Do not drive or operate machinery until you know how this medication affects you. Sit up or stand slowly to reduce the risk of dizzy or fainting spells. Drinking alcohol with this medication can increase the risk of these side effects. Your mouth may get dry. Chewing sugarless gum or sucking hard candy and drinking plenty of water may help. Contact your care team if the problem does not go away or is severe. This medication may cause dry eyes and blurred vision. If you wear contact lenses, you may feel some discomfort. Lubricating eye drops may help. See your care team if the problem does not go away or is severe. If you are receiving skin tests for allergies, tell your care team you are taking this medication. What side effects may I notice from receiving this medication? Side effects that you should report to your care team  as soon as possible: Allergic reactions--skin rash, itching, hives, swelling of the face, lips, tongue, or throat Heart rhythm changes--fast or irregular heartbeat, dizziness, feeling faint or lightheaded, chest pain, trouble breathing Side effects that usually do not require medical attention (report to your care team if they continue or are bothersome): Confusion Drowsiness Dry mouth Hallucinations Headache This list may not describe all possible side effects. Call your doctor for medical advice about side effects. You may report side effects to FDA at 1-800-FDA-1088. Where should I keep my medication? Keep out of the reach of children and pets. Store at room temperature between 15 and 30 degrees C (59 and 86 degrees F). Keep container tightly closed. Throw away any unused medication after the expiration date. NOTE: This sheet is a summary. It may not cover all possible information. If you have questions about this medicine, talk to your doctor, pharmacist, or health care provider.  2024 Elsevier/Gold Standard (2022-03-20 00:00:00)

## 2023-12-22 NOTE — Progress Notes (Signed)
   12/22/2023  Patient ID: Karl Barrett, male   DOB: 06/10/1971, 53 y.o.   MRN: [id number]  Briefly called and spoke with the patient on the phone this morning. Arnetta Lank he was headed into his Psychiatrist's office as they called him this morning with a last-minute opening. Requested I call back tomorrow morning. Will try again then.

## 2023-12-22 NOTE — Progress Notes (Signed)
 [facility] MD OP Progress Note  12/22/2023 10:21 AM Karl Barrett  MRN:  [id number]  Chief Complaint:  Chief Complaint  Patient presents with   Follow-up   Depression   Anxiety   Medication Refill   Discussed the use of AI scribe software for clinical note transcription with the patient, who gave verbal consent to proceed.  History of Present Illness Karl Barrett is a 53 year old Caucasian male currently unemployed, separated, lives in [place], has a history of depression, generalized anxiety disorder, presented for medication management follow-up.  He has a history of major depression and generalized anxiety disorder. Recently, he has not been compliant with his medications, Wellbutrin  (bupropion ) and Lexapro  (escitalopram ), due to life changes and lack of access.  Although Wellbutrin  was added in February he never took the medication until 2 weeks ago.  He however stopped taking it a week ago due to situational stressors and changes in living situation.  He describes significant life stressors, including moving multiple times and currently living with his ex-wife and youngest daughter. Family issues, such as concerns about his nephew's behavior and the safety of his grandchildren, have been overwhelming. He is also dealing with the task of sorting through stored personal belongings, which has left him feeling exhausted and overwhelmed.  Denies suicidal ideation, but he feels 'alone' and 'overwhelmed' by his circumstances. Anxiety attacks have occurred, including a recent episode that led to a dizzy spell. He expresses a need for support and is interested in talking to a therapist.  He is currently unemployed and lacks a source of income. He is involved with vocational rehabilitation to gain work training.   He has neglected his health issues over the past five years and is now trying to catch up with medical appointments, including seeing an eye doctor and addressing abnormal test results  related to his hemoglobin.   Visit Diagnosis:    ICD-10-CM   1. MDD (major depressive disorder), recurrent episode, mild (HCC)  F33.0 buPROPion  (WELLBUTRIN ) 75 MG tablet    2. GAD (generalized anxiety disorder)  F41.1 hydrOXYzine  (ATARAX ) 10 MG tablet    3. Attention and concentration deficit  R41.840       Past Psychiatric History: I have reviewed past psychiatric history from progress note on 10/18/2023.  Past trials of Lexapro .  Past Medical History:  Past Medical History:  Diagnosis Date   Anxiety    Barrett esophagus    Depression    Diabetes mellitus, type II (HCC)    Hyperlipidemia    Prediabetes     Past Surgical History:  Procedure Laterality Date   COLONOSCOPY  2008   COLONOSCOPY  02/02/2018   Tubular adenoma   ESOPHAGOGASTRODUODENOSCOPY  2010   ESOPHAGOGASTRODUODENOSCOPY  02/02/2018   no dysplasia, no Barrett   LITHOTRIPSY      Family Psychiatric History: I have reviewed family psychiatric history from progress note on 10/18/2023.  Family History:  Family History  Problem Relation Age of Onset   Hypertension Mother    Heart disease Mother    Thyroid disease Mother    Pulmonary fibrosis Mother    Heart disease Father    Diabetes Father    Asperger's syndrome Paternal Uncle    Lung cancer Maternal Grandfather    Pancreatic cancer Paternal Grandfather    ADD / ADHD Daughter    ADD / ADHD Niece     Social History: I have reviewed social history from progress note on 10/18/2023. Social History   Socioeconomic  History   Marital status: Legally Separated    Spouse name: Not on file   Number of children: 2   Years of education: Not on file   Highest education level: GED or equivalent  Occupational History   Not on file  Tobacco Use   Smoking status: Every Day    Current packs/day: 1.25    Average packs/day: 1.3 packs/day for 28.0 years (35.0 ttl pk-yrs)    Types: Cigarettes    Passive exposure: Past   Smokeless tobacco: Never  Vaping Use    Vaping status: Former  Substance and Sexual Activity   Alcohol use: Not Currently   Drug use: No   Sexual activity: Not Currently  Other Topics Concern   Not on file  Social History Narrative   Not on file   Social Drivers of Health   Financial Resource Strain: High Risk (08/30/2023)   Overall Financial Resource Strain (CARDIA)    Difficulty of Paying Living Expenses: Very hard  Food Insecurity: Food Insecurity Present (11/29/2023)   Hunger Vital Sign    Worried About Running Out of Food in the Last Year: Often true    Ran Out of Food in the Last Year: Often true  Transportation Needs: Unmet Transportation Needs (11/29/2023)   PRAPARE - Administrator, Civil Service (Medical): Yes    Lack of Transportation (Non-Medical): Yes  Physical Activity: Not on file  Stress: Stress Concern Present (09/20/2023)   [employer] of Occupational Health - Occupational Stress Questionnaire    Feeling of Stress : Rather much  Social Connections: Unknown (08/23/2023)   Social Connection and Isolation Panel [NHANES]    Frequency of Communication with Friends and Family: Twice a week    Frequency of Social Gatherings with Friends and Family: Once a week    Attends Religious Services: Patient declined    Database administrator or Organizations: Not on file    Attends Banker Meetings: Not on file    Marital Status: Not on file    Allergies:  Allergies  Allergen Reactions   Nsaids     Barrett Esophagus     Metabolic Disorder Labs: Lab Results  Component Value Date   HGBA1C 8.4 (H) 11/26/2023   No results found for: "PROLACTIN" Lab Results  Component Value Date   CHOL 157 11/26/2023   TRIG 101 11/26/2023   HDL 48 11/26/2023   CHOLHDL 3.3 11/26/2023   LDLCALC 90 11/26/2023   LDLCALC 124 (H) 08/19/2023   Lab Results  Component Value Date   TSH 1.360 08/19/2023   TSH 2.910 12/08/2016    Therapeutic Level Labs: No results found for: "LITHIUM" No results  found for: "VALPROATE" No results found for: "CBMZ"  Current Medications: Current Outpatient Medications  Medication Sig Dispense Refill   hydrOXYzine  (ATARAX ) 10 MG tablet Take 1 tablet (10 mg total) by mouth 3 (three) times daily as needed for anxiety. 90 tablet 1   Accu-Chek Softclix Lancets lancets Use daily to monitor glucose before breakfast. 100 each 0   albuterol  (VENTOLIN  HFA) 108 (90 Base) MCG/ACT inhaler Inhale 1-2 puffs into the lungs every 6 (six) hours as needed for wheeze or shortness of breath. 54 g 0   Blood Glucose Monitoring Suppl (BLOOD GLUCOSE MONITOR SYSTEM) w/Device KIT Use daily before breakfast. May substitute to any manufacturer covered by patient's insurance. 1 kit 0   budesonide -formoterol  (SYMBICORT ) 160-4.5 MCG/ACT inhaler Inhale 2 puffs into the lungs 2 (two) times daily. (Patient not  taking: Reported on 12/22/2023) 10.2 g 11   buPROPion  (WELLBUTRIN ) 75 MG tablet Take 1 tablet (75 mg total) by mouth in the morning. 30 tablet 1   ciclopirox  (PENLAC ) 8 % solution Apply topically at bedtime. Apply over nail and surrounding skin. Apply daily over previous coat. After seven (7) days, may remove with alcohol and continue cycle. (Patient not taking: Reported on 12/22/2023) 6.6 mL 0   empagliflozin  (JARDIANCE ) 25 MG TABS tablet Take 1 tablet (25 mg total) by mouth daily. 90 tablet 0   escitalopram  (LEXAPRO ) 10 MG tablet Take 1 tablet (10 mg total) by mouth at bedtime. (Patient not taking: Reported on 12/22/2023) 90 tablet 1   glipiZIDE  (GLUCOTROL  XL) 10 MG 24 hr tablet Take 1 tablet (10 mg total) by mouth daily with breakfast. (Patient not taking: Reported on 12/22/2023) 90 tablet 0   Glucose Blood (BLOOD GLUCOSE TEST STRIPS) STRP Use daily before breakfast. May substitute to any manufacturer covered by patient's insurance. 100 strip 3   Lancet Device MISC 1 each by Does not apply route daily before breakfast. May substitute to any manufacturer covered by patient's insurance.  (Patient not taking: Reported on 12/22/2023) 1 each 0   lisinopril  (ZESTRIL ) 5 MG tablet Take 1 tablet (5 mg total) by mouth daily. (Patient not taking: Reported on 12/22/2023) 90 tablet 3   rosuvastatin  (CRESTOR ) 40 MG tablet Take 1 tablet (40 mg total) by mouth daily. (Patient not taking: Reported on 12/22/2023) 90 tablet 3   No current facility-administered medications for this visit.     Musculoskeletal: Strength & Muscle Tone: within normal limits Gait & Station: normal Patient leans: N/A  Psychiatric Specialty Exam: Review of Systems  Psychiatric/Behavioral:  Positive for decreased concentration, dysphoric mood and sleep disturbance. The patient is nervous/anxious.     Blood pressure 136/68, pulse 82, temperature 98.4 F (36.9 C), temperature source Temporal, height 6\' 1"  (1.854 m), weight 206 lb 3.2 oz (93.5 kg), SpO2 100%.Body mass index is 27.2 kg/m.  General Appearance: Casual  Eye Contact:  Fair  Speech:  Clear and Coherent  Volume:  Normal  Mood:  Anxious and Depressed  Affect:  Congruent  Thought Process:  Goal Directed and Descriptions of Associations: Intact  Orientation:  Full (Time, Place, and Person)  Thought Content: Logical   Suicidal Thoughts:  No  Homicidal Thoughts:  No  Memory:  Immediate;   Fair Recent;   Fair Remote;   Fair  Judgement:  Fair  Insight:  Fair  Psychomotor Activity:  Normal  Concentration:  Concentration: Fair and Attention Span: Fair  Recall:  [employer] of Knowledge: Fair  Language: Fair  Akathisia:  No  Handed:  Right  AIMS (if indicated): not done  Assets:  Desire for Improvement Housing Social Support Transportation  ADL's:  Intact  Cognition: WNL  Sleep:  Poor   Screenings: GAD-7    Loss adjuster, chartered Office Visit from 11/17/2023 in [facility] Office Visit from 10/18/2023 in [facility] Office Visit from 08/19/2023 in [facility]  Office Visit from 05/30/2021 in [facility] at [facility] Office Visit from 02/22/2019 in [facility] at [facility]  Total GAD-7 Score 7 12 11 6 14       PHQ2-9    Flowsheet Row Office Visit from 11/29/2023 in [facility] [place]  Office Visit from 11/17/2023 in [facility] Office Visit from 10/18/2023 in [facility] Patient Outreach Telephone from 08/30/2023 in [place] [facility] Office Visit from 08/19/2023 in [facility]  PHQ-2 Total Score 2 2 6 6 6   PHQ-9 Total Score -- 9 22 21 23       Flowsheet Row Office Visit from 12/22/2023 in [facility] Office Visit from 10/18/2023 in [facility] ED from 07/26/2022 in [facility]  C-SSRS RISK CATEGORY Low Risk No Risk No Risk        Assessment and Plan:Waris Shintaro Dietert is a 53 year old Caucasian male, has a history of depression, anxiety, multiple medical problems was evaluated in office today for a follow-up appointment, discussed assessment and plan as noted below.  Assessment & Plan Major Depression-unstable Generalized Anxiety Disorder-unstable Chronic major depression and generalized anxiety disorder exacerbated by significant life stressors, including housing instability and family issues. Non-compliance with bupropion  and escitalopram  due to logistical challenges and personal circumstances. Reports feeling overwhelmed and unsupported, but denies active suicidal ideation or intent. Experiences feelings of isolation and burden from caretaking responsibilities. Recent dizziness possibly related to anxiety. No current income and significant life changes contributing to stress and anxiety. Discussed  medication adherence and potential benefits of therapy. - Reinstate Bupropion  and Escitalopram , instructing to take Bupropion  in the morning and continue Escitalopram . - Continue Bupropion  75 mg daily - Continue Escitalopram  10 mg daily - Prescribe Hydroxyzine  10 mg 3 times a day as needed for acute anxiety episodes. - Provide a list of community therapy resources and instruct to contact providers who accept Medicaid for therapy. - Discuss the possibility of participating in a partial hospitalization program in the future, patient currently declines. - Schedule follow-up appointment in three weeks and place on a call list for the first available appointment. - Instruct to visit the nearest emergency department if experiencing a crisis.  Rule out ADHD-unstable Patient with ongoing focus, concentration problems as well as history of reading and comprehension problem.  Patient was referred for ADHD testing-patient has been noncompliant. - Referred to [facility] for ADHD testing-pending.   Follow-up Follow-up in clinic in 3 to 4 weeks or sooner if needed.  Collaboration of Care: Collaboration of Care: Referral or follow-up with counselor/therapist AEB encouraged to establish care with therapist and to complete ADHD testing.  Patient/Guardian was advised Release of Information must be obtained prior to any record release in order to collaborate their care with an outside provider. Patient/Guardian was advised if they have not already done so to contact the registration department to sign all necessary forms in order for us  to release information regarding their care.   Consent: Patient/Guardian gives verbal consent for treatment and assignment of benefits for services provided during this visit. Patient/Guardian expressed understanding and agreed to proceed.  This note was generated in part or whole with voice recognition software. Voice recognition is usually quite accurate but there are  transcription errors that can and very often do occur. I apologize for any typographical errors that were not detected and corrected.     Karl Hyser, MD 12/24/2023, 7:47 AM

## 2023-12-23 ENCOUNTER — Other Ambulatory Visit: Payer: Self-pay

## 2023-12-23 MED ORDER — EMPAGLIFLOZIN 25 MG PO TABS
25.0000 mg | ORAL_TABLET | Freq: Every day | ORAL | 0 refills | Status: DC
  Filled 2023-12-23: qty 90, 90d supply, fill #0

## 2023-12-23 NOTE — Progress Notes (Signed)
   12/23/2023  Patient ID: Karl Barrett, male   DOB: 1971-02-17, 53 y.o.   MRN: [id number]  Attempted to contact patient for scheduled appointment for medication management. Left HIPAA compliant message for patient to return my call at their convenience.    Went straight to voicemail each attempt.  Appears patient will be getting all of his prescriptions from the pharmacy either today or tomorrow. 3 medications are still in transit from [facility] for pick-up later. Refill for Jardiance  sent to pharmacy today- only medications missing besides the inhalers for pick-up. Received Bupropion  and Hydroxyzine  yesterday.   Also appears he will just now be getting his BG testing supplies, so has NOT been checking readings. All notes suggest non-compliance recently.   Delvin File, PharmD [facility]  Phone Number: [phone]

## 2023-12-30 ENCOUNTER — Telehealth: Payer: Self-pay | Admitting: Pharmacist

## 2023-12-30 NOTE — Progress Notes (Signed)
   12/30/2023  Patient ID: Karl Barrett, male   DOB: 1971/03/25, 53 y.o.   MRN: [id number]  Called and spoke with the patient on the phone today regarding medications. Was off track for awhile, but filled his pillbox and has been doing well over the past week.  Plans to go to the pharmacy today to get all of his prescriptions. Advised they are all for a 90DS and $4 copay (about $20 overall). Should be getting the testings supplies (meter, strips, and lancets) at no charge as well. Thanked for the update.  Also discussed he has 2 visits back-to-back on 5/19. Locations are 4 minutes apart according to map. Should be okay to keep.    Delvin File, PharmD [facility]  Phone Number: [phone]

## 2024-01-03 NOTE — Progress Notes (Deleted)
  Start: *** end: *** Patient is here today *** Patient would like to learn *** Patient lives with ***.  *** shopping and cooking.  History includes:  *** Medications include:  *** Labs noted:  ***  Lab Results  Component Value Date   HGBA1C 8.4 (H) 11/26/2023   Glip with breakfast Jardiance

## 2024-01-10 ENCOUNTER — Ambulatory Visit: Admitting: Dietician

## 2024-01-10 ENCOUNTER — Other Ambulatory Visit: Payer: Self-pay

## 2024-01-10 ENCOUNTER — Ambulatory Visit: Admitting: Psychiatry

## 2024-01-10 DIAGNOSIS — E1140 Type 2 diabetes mellitus with diabetic neuropathy, unspecified: Secondary | ICD-10-CM

## 2024-01-11 ENCOUNTER — Encounter (INDEPENDENT_AMBULATORY_CARE_PROVIDER_SITE_OTHER): Payer: Self-pay

## 2024-01-20 ENCOUNTER — Other Ambulatory Visit: Payer: Self-pay

## 2024-02-04 ENCOUNTER — Other Ambulatory Visit: Payer: Self-pay | Admitting: Pharmacist

## 2024-02-04 NOTE — Progress Notes (Signed)
   02/04/2024  Patient ID: Karl Barrett, male   DOB: 1971/03/24, 53 y.o.   MRN: [id number]  Called and spoke with the patient on the phone today. Reports when he takes all of his medications he gets diarrhea, so has backed off of them a little and uses them to keeps keep regular bowel movements. Unknown which medication in specific is causing this reaction. Tries to separate the timing of them when he does take everything.   Appears med fill history is up-to-date on everything except the hydroxyzine  + bupropion . Has used 3 tablets of hydroxyzine  and reports using up old supply of bupropion  before requesting a refill.   Has not checked his blood sugar when we last talked. Says he has been couch surfing and staying with friends right now. When asked about talking with a Child psychotherapist, he said that the program to assist has been out of funding for months and there was nothing more that could be done currently.   Reminded of upcoming appointments and advised to ensure he is taking the Jardiance  + Glipizide  each morning as prescribed for blood sugar. Confirmed understanding.   Will follow-up on 04/06/24 for med adherence and new A1c check.   Delvin File, PharmD [facility]  Phone Number: [phone]

## 2024-02-09 ENCOUNTER — Telehealth: Payer: Self-pay | Admitting: Psychiatry

## 2024-02-09 NOTE — Telephone Encounter (Signed)
 Received fax from [facility] that they will not be able to accept this patient due to high referral volume.

## 2024-02-09 NOTE — Telephone Encounter (Signed)
 left message requesting a call back.

## 2024-02-14 NOTE — Telephone Encounter (Signed)
 Did not hear back from pt. Left message. Went ahead and sent the referral

## 2024-02-16 NOTE — Progress Notes (Signed)
 Diabetes Self-Management Education  Visit Type: First/Initial  Appt. Start Time: 1050 Appt. End Time: 1157  02/21/2024  [name], identified by name and date of birth, is a 53 y.o. male with a diagnosis of Diabetes: Type 2.   ASSESSMENT  Patient is here today alone. Pt reports he has ongoing shoulder and knee pain and desires surgery.  Pt reports symptoms occurring stating diarrheaand feels this is related to side effects of medications and states he discontinues administration at times. Patient would like to learn what to eat for diabetes. Patient lives with family.  Pt reports he does his own shopping and cooking. Pt reports pain interferes with sleep and exercise. Pt reports he sees a therapist once every three months. Pt reports daily intake of sugary sweetened beverages.  All Pt's questions were answered during this encounter.   History includes:   Past Medical History:  Diagnosis Date   Anxiety    Barrett esophagus    Depression    Diabetes mellitus, type II (HCC)    Hyperlipidemia    Prediabetes     Medications include:   Current Outpatient Medications:    empagliflozin  (JARDIANCE ) 25 MG TABS tablet, Take 1 tablet (25 mg total) by mouth daily., Disp: 90 tablet, Rfl: 0   glipiZIDE  (GLUCOTROL  XL) 10 MG 24 hr tablet, Take 1 tablet (10 mg total) by mouth daily with breakfast., Disp: 90 tablet, Rfl: 0   Accu-Chek Softclix Lancets lancets, Use daily to monitor glucose before breakfast., Disp: 100 each, Rfl: 0   albuterol  (VENTOLIN  HFA) 108 (90 Base) MCG/ACT inhaler, Inhale 1-2 puffs into the lungs every 6 (six) hours as needed for wheeze or shortness of breath., Disp: 54 g, Rfl: 0   Blood Glucose Monitoring Suppl (BLOOD GLUCOSE MONITOR SYSTEM) w/Device KIT, Use daily before breakfast. May substitute to any manufacturer covered by patient's insurance., Disp: 1 kit, Rfl: 0   budesonide -formoterol  (SYMBICORT ) 160-4.5 MCG/ACT inhaler, Inhale 2 puffs into the lungs 2 (two) times  daily. (Patient not taking: Reported on 02/04/2024), Disp: 10.2 g, Rfl: 11   buPROPion  (WELLBUTRIN ) 75 MG tablet, Take 1 tablet (75 mg total) by mouth in the morning., Disp: 30 tablet, Rfl: 1   ciclopirox  (PENLAC ) 8 % solution, Apply topically at bedtime. Apply over nail and surrounding skin. Apply daily over previous coat. After seven (7) days, may remove with alcohol and continue cycle. (Patient not taking: Reported on 12/22/2023), Disp: 6.6 mL, Rfl: 0   escitalopram  (LEXAPRO ) 10 MG tablet, Take 1 tablet (10 mg total) by mouth at bedtime., Disp: 90 tablet, Rfl: 1   Glucose Blood (BLOOD GLUCOSE TEST STRIPS) STRP, Use daily before breakfast. May substitute to any manufacturer covered by patient's insurance., Disp: 100 strip, Rfl: 3   hydrOXYzine  (ATARAX ) 10 MG tablet, Take 1 tablet (10 mg total) by mouth 3 (three) times daily as needed for anxiety., Disp: 90 tablet, Rfl: 1   Lancet Device MISC, 1 each by Does not apply route daily before breakfast. May substitute to any manufacturer covered by patient's insurance., Disp: 1 each, Rfl: 0   lisinopril  (ZESTRIL ) 5 MG tablet, Take 1 tablet (5 mg total) by mouth daily., Disp: 90 tablet, Rfl: 3   rosuvastatin  (CRESTOR ) 40 MG tablet, Take 1 tablet (40 mg total) by mouth daily., Disp: 90 tablet, Rfl: 3   Labs noted:   Lab Results  Component Value Date   HGBA1C 8.4 (H) 11/26/2023   Lab Results  Component Value Date   CHOL 157 11/26/2023  HDL 48 11/26/2023   LDLCALC 90 11/26/2023   TRIG 101 11/26/2023   CHOLHDL 3.3 11/26/2023   Lab Results  Component Value Date   CHOL 157 11/26/2023   HDL 48 11/26/2023   LDLCALC 90 11/26/2023   TRIG 101 11/26/2023   CHOLHDL 3.3 11/26/2023   There were no vitals taken for this visit. There is no height or weight on file to calculate BMI. Wt Readings from Last 3 Encounters:  12/22/23 206 lb 3.2 oz (93.5 kg)  11/29/23 206 lb 3.2 oz (93.5 kg)  11/17/23 205 lb (93 kg)    Diabetes Self-Management Education -  02/21/24 1101       Visit Information   Visit Type First/Initial      Initial Visit   Diabetes Type Type 2    Date Diagnosed 10 years ago    Are you currently following a meal plan? No    Are you taking your medications as prescribed? No      Health Coping   How would you rate your overall health? Fair      Psychosocial Assessment   Patient Belief/Attitude about Diabetes Other (comment)   frustrated   What is the hardest part about your diabetes right now, causing you the most concern, or is the most worrisome to you about your diabetes?   Taking/obtaining medications    Self-care barriers Lack of material resources    Self-management support Doctor's office    Other persons present Patient    Patient Concerns Medication;Problem Solving    Special Needs None    Preferred Learning Style No preference indicated    Learning Readiness Contemplating    How often do you need to have someone help you when you read instructions, pamphlets, or other written materials from your doctor or pharmacy? 3 - Sometimes    What is the last grade level you completed in school? GED      Pre-Education Assessment   Patient understands the diabetes disease and treatment process. Needs Instruction    Patient understands incorporating nutritional management into lifestyle. Needs Instruction    Patient undertands incorporating physical activity into lifestyle. Needs Instruction    Patient understands using medications safely. Needs Instruction    Patient understands monitoring blood glucose, interpreting and using results Needs Instruction    Patient understands prevention, detection, and treatment of acute complications. Needs Instruction    Patient understands prevention, detection, and treatment of chronic complications. Needs Instruction    Patient understands how to develop strategies to address psychosocial issues. Needs Instruction    Patient understands how to develop strategies to promote  health/change behavior. Needs Instruction      Complications   Last HgB A1C per patient/outside source 8.1 %    How often do you check your blood sugar? 0 times/day (not testing)   randomly   Fasting Blood glucose range (mg/dL) 869-820    Postprandial Blood glucose range (mg/dL) 819-799   30 minutes ppg per Pt   Number of hypoglycemic episodes per month 0    Number of hyperglycemic episodes ( >200mg /dL): Rare    Can you tell when your blood sugar is high? No    Have you had a dilated eye exam in the past 12 months? Yes    Have you had a dental exam in the past 12 months? No    Are you checking your feet? No      Dietary Intake   Breakfast cheeseburger, mayo, dr pepper  Lunch mcdonalds: 1/2 double cheeseburger, 2 nuggets, 1/2 small french fry, dr pepper    Snack (afternoon) chips    Dinner ~ 1 cup of baked beans,2 hot dog with bun with mustar, chili, slaw, onions, dr pepper    Beverage(s) dr pepper, water      Activity / Exercise   Activity / Exercise Type ADL's    How many days per week do you exercise? 0    How many minutes per day do you exercise? 0    Total minutes per week of exercise 0      Patient Education   Previous Diabetes Education No    Disease Pathophysiology Definition of diabetes, type 1 and 2, and the diagnosis of diabetes    Healthy Eating Plate Method;Role of diet in the treatment of diabetes and the relationship between the three main macronutrients and blood glucose level;Food label reading, portion sizes and measuring food.;Reviewed blood glucose goals for pre and post meals and how to evaluate the patients' food intake on their blood glucose level.    Being Active Role of exercise on diabetes management, blood pressure control and cardiac health.    Medications Reviewed patients medication for diabetes, action, purpose, timing of dose and side effects.    Monitoring Daily foot exams;Identified appropriate SMBG and/or A1C goals.    Acute complications  Discussed and identified patients' prevention, symptoms, and treatment of hyperglycemia.    Chronic complications Relationship between chronic complications and blood glucose control    Diabetes Stress and Support Role of stress on diabetes;Worked with patient to identify barriers to care and solutions;Identified and addressed patients feelings and concerns about diabetes    Lifestyle and Health Coping Lifestyle issues that need to be addressed for better diabetes care      Individualized Goals (developed by patient)   Nutrition Follow meal plan discussed    Physical Activity Exercise 1-2 times per week    Medications Not Applicable    Monitoring  Test my blood glucose as discussed    Problem Solving Addressing barriers to behavior change;Sleep Pattern    Reducing Risk stop smoking;examine blood glucose patterns    Health Coping Ask for help with psychological, social, or emotional issues      Post-Education Assessment   Patient understands the diabetes disease and treatment process. Needs Review    Patient understands incorporating nutritional management into lifestyle. Needs Review    Patient undertands incorporating physical activity into lifestyle. Needs Review    Patient understands using medications safely. Needs Review    Patient understands monitoring blood glucose, interpreting and using results Needs Review    Patient understands prevention, detection, and treatment of acute complications. Needs Review    Patient understands prevention, detection, and treatment of chronic complications. Needs Review    Patient understands how to develop strategies to address psychosocial issues. Needs Review    Patient understands how to develop strategies to promote health/change behavior. Needs Review      Outcomes   Expected Outcomes Demonstrated interest in learning. Expect positive outcomes    Future DMSE 3-4 months    Program Status Not Completed          Individualized Plan for  Diabetes Self-Management Training:   Learning Objective:  Patient will have a greater understanding of diabetes self-management. Patient education plan is to attend individual and/or group sessions per assessed needs and concerns.   Plan:   Patient Instructions  1- decrease intake of sugary sweetened beverages, increase water intake  Expected Outcomes:  Demonstrated interest in learning. Expect positive outcomes  Education material provided: ADA - How to Thrive: A Guide for Your Journey with Diabetes, My Plate, Snack sheet, and Diabetes Resources  If problems or questions, patient to contact team via:  Phone  Future DSME appointment: 3-4 months

## 2024-02-21 ENCOUNTER — Encounter: Attending: Family Medicine | Admitting: Dietician

## 2024-02-21 DIAGNOSIS — E1140 Type 2 diabetes mellitus with diabetic neuropathy, unspecified: Secondary | ICD-10-CM | POA: Insufficient documentation

## 2024-02-21 DIAGNOSIS — Z713 Dietary counseling and surveillance: Secondary | ICD-10-CM | POA: Insufficient documentation

## 2024-02-21 NOTE — Patient Instructions (Addendum)
 1- decrease intake of sugary sweetened beverages, increase water intake

## 2024-02-23 ENCOUNTER — Other Ambulatory Visit: Payer: Self-pay

## 2024-02-23 ENCOUNTER — Ambulatory Visit: Admitting: Family Medicine

## 2024-02-23 ENCOUNTER — Encounter: Payer: Self-pay | Admitting: Family Medicine

## 2024-02-23 VITALS — BP 94/69 | HR 71 | Resp 16 | Wt 204.2 lb

## 2024-02-23 DIAGNOSIS — K22719 Barrett's esophagus with dysplasia, unspecified: Secondary | ICD-10-CM

## 2024-02-23 DIAGNOSIS — E1159 Type 2 diabetes mellitus with other circulatory complications: Secondary | ICD-10-CM | POA: Diagnosis not present

## 2024-02-23 DIAGNOSIS — B351 Tinea unguium: Secondary | ICD-10-CM

## 2024-02-23 DIAGNOSIS — E1165 Type 2 diabetes mellitus with hyperglycemia: Secondary | ICD-10-CM

## 2024-02-23 DIAGNOSIS — R0683 Snoring: Secondary | ICD-10-CM

## 2024-02-23 DIAGNOSIS — F330 Major depressive disorder, recurrent, mild: Secondary | ICD-10-CM

## 2024-02-23 DIAGNOSIS — R42 Dizziness and giddiness: Secondary | ICD-10-CM

## 2024-02-23 DIAGNOSIS — I152 Hypertension secondary to endocrine disorders: Secondary | ICD-10-CM

## 2024-02-23 DIAGNOSIS — G8929 Other chronic pain: Secondary | ICD-10-CM

## 2024-02-23 DIAGNOSIS — G479 Sleep disorder, unspecified: Secondary | ICD-10-CM

## 2024-02-23 DIAGNOSIS — M150 Primary generalized (osteo)arthritis: Secondary | ICD-10-CM

## 2024-02-23 DIAGNOSIS — Z860100 Personal history of colon polyps, unspecified: Secondary | ICD-10-CM

## 2024-02-23 DIAGNOSIS — D582 Other hemoglobinopathies: Secondary | ICD-10-CM

## 2024-02-23 MED ORDER — SITAGLIPTIN PHOSPHATE 100 MG PO TABS
100.0000 mg | ORAL_TABLET | Freq: Every day | ORAL | 2 refills | Status: DC
  Filled 2024-02-23 – 2024-02-29 (×2): qty 30, 30d supply, fill #0

## 2024-02-23 MED ORDER — TRAMADOL HCL 50 MG PO TABS
50.0000 mg | ORAL_TABLET | Freq: Two times a day (BID) | ORAL | 0 refills | Status: DC | PRN
  Filled 2024-02-23: qty 60, 30d supply, fill #0

## 2024-02-23 MED ORDER — METHYLPREDNISOLONE 4 MG PO TBPK
ORAL_TABLET | ORAL | 0 refills | Status: AC
  Filled 2024-02-23: qty 21, 6d supply, fill #0

## 2024-02-23 MED ORDER — CAPSAICIN 0.1 % EX CREA
1.0000 | TOPICAL_CREAM | Freq: Four times a day (QID) | CUTANEOUS | 1 refills | Status: DC | PRN
  Filled 2024-02-23: qty 56.6, 15d supply, fill #0

## 2024-02-23 NOTE — Progress Notes (Signed)
 Established patient visit   Patient: Karl Barrett   DOB: May 30, 1971   53 y.o. Male  MRN: [id number] Visit Date: 02/23/2024  Today's healthcare provider: LAURAINE LOISE BUOY, DO   Chief Complaint  Patient presents with   Diabetes   Anxiety   Depression   Follow-up    Would like colonoscopy,   Knee Pain    Would like to discuss pain meds for knee/shoulder pain   Subjective    Diabetes  Anxiety    Depression        Past medical history includes anxiety.   Knee Pain    Karl Barrett is a 53 year old male with diabetes and joint pain who presents for follow-up and medication management.  He is experiencing difficulty managing his medications due to a chaotic lifestyle and has not been taking his antidepressants as prescribed. Significant life stressors, including financial instability and lack of housing, impact his ability to manage his health.  He has bilateral knee and shoulder pain, exacerbated by physical activity, affecting his ability to work.  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  The pain is described as aching and sometimes sharp, particularly in the shoulders, worsening at the end of the day. He has tried physical therapy in the past. He is considering returning to work in [employer], which requires physical labor that may exacerbate his pain.  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  He experiences gastrointestinal issues, including diarrhea, which he associates with his medication regimen. He has previously taken metformin , which caused gastrointestinal upset and sensitivity in his feet. He is currently not taking any medications consistently due to these side effects as well as his reported CAD lifestyle. He has a history  of using a pill box to organize his medications but finds it challenging due to varying pill appearances and dosages.  He reports sleep disturbances, often due to pain in his knees and shoulders, and often experiences loud snoring. He also mentions occasional vertigo and headaches, which he attributes to sinus issues and possibly an ear problem.  His diabetes management includes checking blood sugars at home, which typically range between 140 and 160 mg/dL before meals. He has been working with a dietitian to better understand dietary choices but struggles with meal planning due to financial constraints.  He has a history of nail infection but has not filled a recent prescription for ciclopirox . He is also concerned about the potential side effects of his current medications, including lisinopril , which he takes for kidney protection due to diabetes, and Jardiance , which he associates with increased urination.      Medications: Outpatient Medications Prior to Visit  Medication Sig   Accu-Chek Softclix Lancets lancets Use daily to monitor glucose before breakfast.   albuterol  (VENTOLIN  HFA) 108 (90 Base) MCG/ACT inhaler Inhale 1-2 puffs into the lungs every 6 (six) hours as needed for wheeze or shortness of breath.   Blood Glucose Monitoring Suppl (BLOOD GLUCOSE MONITOR SYSTEM) w/Device KIT Use daily before breakfast. May substitute to any manufacturer covered by patient's insurance.   budesonide -formoterol  (SYMBICORT ) 160-4.5 MCG/ACT inhaler Inhale 2 puffs into the lungs 2 (two) times daily.   buPROPion  (WELLBUTRIN ) 75 MG tablet Take 1  tablet (75 mg total) by mouth in the morning.   empagliflozin  (JARDIANCE ) 25 MG TABS tablet Take 1 tablet (25 mg total) by mouth daily.   escitalopram  (LEXAPRO ) 10 MG tablet Take 1 tablet (10 mg total) by mouth at bedtime.   glipiZIDE  (GLUCOTROL  XL) 10 MG 24 hr tablet Take 1 tablet (10 mg total) by mouth daily with breakfast.   Glucose Blood (BLOOD GLUCOSE TEST  STRIPS) STRP Use daily before breakfast. May substitute to any manufacturer covered by patient's insurance.   hydrOXYzine  (ATARAX ) 10 MG tablet Take 1 tablet (10 mg total) by mouth 3 (three) times daily as needed for anxiety.   Lancet Device MISC 1 each by Does not apply route daily before breakfast. May substitute to any manufacturer covered by patient's insurance.   lisinopril  (ZESTRIL ) 5 MG tablet Take 1 tablet (5 mg total) by mouth daily.   rosuvastatin  (CRESTOR ) 40 MG tablet Take 1 tablet (40 mg total) by mouth daily.   ciclopirox  (PENLAC ) 8 % solution Apply topically at bedtime. Apply over nail and surrounding skin. Apply daily over previous coat. After seven (7) days, may remove with alcohol and continue cycle. (Patient not taking: Reported on 02/23/2024)   No facility-administered medications prior to visit.        Objective    BP 94/69 (BP Location: Right Arm, Patient Position: Sitting, Cuff Size: Large)   Pulse 71   Resp 16   Wt 204 lb 3.2 oz (92.6 kg)   SpO2 99%   BMI 26.94 kg/m     Physical Exam Vitals and nursing note reviewed.  Constitutional:      General: He is not in acute distress.    Appearance: Normal appearance.  HENT:     Head: Normocephalic and atraumatic.  Eyes:     General: No scleral icterus.    Conjunctiva/sclera: Conjunctivae normal.  Cardiovascular:     Rate and Rhythm: Normal rate.  Pulmonary:     Effort: Pulmonary effort is normal.  Musculoskeletal:        General: No tenderness.       Legs:     Comments: Pain in areas noted (not to palpation) and under left patella horizontally when patient stood.  Neurological:     Mental Status: He is alert and oriented to person, place, and time. Mental status is at baseline.  Psychiatric:        Mood and Affect: Mood normal.        Behavior: Behavior normal.      No results found for any visits on 02/23/24.  Assessment & Plan    Uncontrolled type 2 diabetes mellitus with hyperglycemia (HCC) -      SITagliptin Phosphate; Take 1 tablet (100 mg total) by mouth daily.  Dispense: 30 tablet; Refill: 2  Hypertension associated with diabetes (HCC)  Loud snoring -     Ambulatory referral to Sleep Studies  Restless sleeper -     Ambulatory referral to Sleep Studies  MDD (major depressive disorder), recurrent episode, mild (HCC)  Barrett's esophagus with dysplasia -     Ambulatory referral to Gastroenterology  History of colon polyps -     Ambulatory referral to Gastroenterology  Primary osteoarthritis involving multiple joints -     Capsaicin; Apply 1 Application topically 4 (four) times daily as needed.  Dispense: 56.6 g; Refill: 1 -     methylPREDNISolone; Take 6 tablets (24 mg total) by mouth daily for 1 day, THEN 5 tablets (20 mg total) daily  for 1 day, THEN 4 tablets (16 mg total) daily for 1 day, THEN 3 tablets (12 mg total) daily for 1 day, THEN 2 tablets (8 mg total) daily for 1 day, THEN 1 tablet (4 mg total) daily for 1 day.  Dispense: 21 tablet; Refill: 0 -     Ambulatory referral to Physical Therapy -     traMADol HCl; Take 1 tablet (50 mg total) by mouth every 12 (twelve) hours as needed for severe pain (pain score 7-10).  Dispense: 60 tablet; Refill: 0  Chronic pain of both knees -     Capsaicin; Apply 1 Application topically 4 (four) times daily as needed.  Dispense: 56.6 g; Refill: 1 -     methylPREDNISolone; Take 6 tablets (24 mg total) by mouth daily for 1 day, THEN 5 tablets (20 mg total) daily for 1 day, THEN 4 tablets (16 mg total) daily for 1 day, THEN 3 tablets (12 mg total) daily for 1 day, THEN 2 tablets (8 mg total) daily for 1 day, THEN 1 tablet (4 mg total) daily for 1 day.  Dispense: 21 tablet; Refill: 0 -     Ambulatory referral to Physical Therapy -     traMADol HCl; Take 1 tablet (50 mg total) by mouth every 12 (twelve) hours as needed for severe pain (pain score 7-10).  Dispense: 60 tablet; Refill: 0  Chronic pain of both shoulders -     Capsaicin; Apply  1 Application topically 4 (four) times daily as needed.  Dispense: 56.6 g; Refill: 1 -     methylPREDNISolone; Take 6 tablets (24 mg total) by mouth daily for 1 day, THEN 5 tablets (20 mg total) daily for 1 day, THEN 4 tablets (16 mg total) daily for 1 day, THEN 3 tablets (12 mg total) daily for 1 day, THEN 2 tablets (8 mg total) daily for 1 day, THEN 1 tablet (4 mg total) daily for 1 day.  Dispense: 21 tablet; Refill: 0 -     Ambulatory referral to Physical Therapy -     traMADol HCl; Take 1 tablet (50 mg total) by mouth every 12 (twelve) hours as needed for severe pain (pain score 7-10).  Dispense: 60 tablet; Refill: 0  Elevated hemoglobin (HCC) -     Ambulatory referral to Sleep Studies  Vertigo -     Ambulatory referral to ENT  Onychomycosis of great toe      Type II Diabetes Mellitus Suboptimal management with elevated A1c on last check. Glipizide  may cause diarrhea. Metformin  previously caused adverse effects. Inconsistent medication adherence due to organizational issues and side effects. - Discontinue glipizide  to assess diarrhea resolution. - Prescribe Januvia as alternative. - Encourage adherence to Jardiance , adding back one medication for consistent use at a time - Encourage consistent medication use with pill organizer. - Counseled patient on medication adherence; plan to recheck A1c on follow-up  Hypertension associated with diabetes Chronic, stable.  Blood pressure mildly low today.  Given patient's concerns regarding dizziness and headache, advised him to try stopping or cutting his lisinopril  5 mg pills in half and we can reassess at his next visit.  Chronic Joint Pain; bilateral knee pain; bilateral shoulder pain Chronic knee and shoulder pain worsened by activity. Previous cortisone injections ineffective. Avoids opioids due to addiction concerns. - Prescribe Medrol dose pack for inflammation. - Prescribe capsaicin cream for pain relief. - Refer to physical  therapy. - Prescribe tramadol as needed for severe pain.  Depression Depression due to  life circumstances. Under psychiatrist care, uses hydroxyzine  as needed. Inconsistent antidepressant use. - Continue hydroxyzine  as needed. - Encourage adherence to bupropion ; may consider stopping escitalopram .  Loud snoring; restless sleeper Reports snoring and poor sleep quality. Previous referral for evaluation not completed. - Send referral for sleep study.  Barrett's esophagus; history of colon polyps Will refer patient back to [facility] gastroenterology for follow-up endoscopy and colonoscopy, as patient reports he is overdue.  Onychomycosis Fungal nail infection. Ciclopirox  prescribed but not used. - Encourage use of ciclopirox  as prescribed.  Vertigo Patient endorses dizziness that feels like inner ear problems he is dealt with previously.  Per patient preference, will refer patient to ENT for further evaluation and recommendations.     Follow-up Requires follow-up for medication changes and chronic condition management. - Schedule follow-up in 6-12 weeks for joint pain and diabetes. - Schedule diabetes follow-up in 3 months.  Return in about 10 weeks (around 05/03/2024) for DM, chronic pain.      There are other unrelated non-urgent complaints, but due to the busy schedule and the amount of time I've already spent with him, time does not permit me to address these routine issues at today's visit. I've requested another appointment to review these additional issues.  I discussed the assessment and treatment plan with the patient  The patient was provided an opportunity to ask questions and all were answered. The patient agreed with the plan and demonstrated an understanding of the instructions.   The patient was advised to call back or seek an in-person evaluation if the symptoms worsen or if the condition fails to improve as anticipated.  Total time was 55 minutes. That  includes chart review before the visit, the actual patient visit, and time spent on documentation after the visit.     LAURAINE LOISE BUOY, DO  [facility] [phone] (phone) [phone] (fax)  [facility]

## 2024-02-23 NOTE — Patient Instructions (Signed)
 [email or web address] - you'll have to looks through to do these; they won't all be applicable.

## 2024-02-24 ENCOUNTER — Other Ambulatory Visit: Payer: Self-pay

## 2024-02-28 ENCOUNTER — Other Ambulatory Visit: Payer: Self-pay

## 2024-02-28 ENCOUNTER — Other Ambulatory Visit (HOSPITAL_COMMUNITY): Payer: Self-pay

## 2024-02-28 ENCOUNTER — Telehealth: Payer: Self-pay

## 2024-02-28 ENCOUNTER — Encounter: Payer: Self-pay | Admitting: Psychology

## 2024-02-28 NOTE — Telephone Encounter (Signed)
 Pharmacy Patient Advocate Encounter   Received notification from CoverMyMeds that prior authorization for Januvia  100MG  tablets is required/requested.   Insurance verification completed.   The patient is insured through [employer] .   Per test claim: PA required; PA submitted to above mentioned insurance via CoverMyMeds Key/confirmation #/EOC [id number] Status is pending

## 2024-02-28 NOTE — Telephone Encounter (Signed)
 Pharmacy Patient Advocate Encounter  Received notification from [employer] that Prior Authorization for Januvia  100MG  tablets  has been APPROVED from 02/28/2024 to 02/27/2025   PA #/Case ID/Reference #: [id number]

## 2024-02-29 ENCOUNTER — Other Ambulatory Visit: Payer: Self-pay

## 2024-03-06 ENCOUNTER — Other Ambulatory Visit: Payer: Self-pay

## 2024-03-06 ENCOUNTER — Encounter: Payer: Self-pay | Admitting: Sleep Medicine

## 2024-03-06 ENCOUNTER — Ambulatory Visit (INDEPENDENT_AMBULATORY_CARE_PROVIDER_SITE_OTHER): Admitting: Sleep Medicine

## 2024-03-06 VITALS — BP 120/70 | HR 62 | Temp 98.1°F | Ht 73.0 in | Wt 207.0 lb

## 2024-03-06 DIAGNOSIS — G4733 Obstructive sleep apnea (adult) (pediatric): Secondary | ICD-10-CM

## 2024-03-06 DIAGNOSIS — F17210 Nicotine dependence, cigarettes, uncomplicated: Secondary | ICD-10-CM | POA: Diagnosis not present

## 2024-03-06 DIAGNOSIS — G4700 Insomnia, unspecified: Secondary | ICD-10-CM

## 2024-03-06 DIAGNOSIS — I10 Essential (primary) hypertension: Secondary | ICD-10-CM

## 2024-03-06 DIAGNOSIS — F5104 Psychophysiologic insomnia: Secondary | ICD-10-CM

## 2024-03-06 MED ORDER — TRAZODONE HCL 50 MG PO TABS
50.0000 mg | ORAL_TABLET | Freq: Every day | ORAL | 3 refills | Status: AC
  Filled 2024-03-06: qty 30, 30d supply, fill #0

## 2024-03-06 NOTE — Patient Instructions (Signed)
 SABRA

## 2024-03-06 NOTE — Progress Notes (Signed)
 Name:Karl Barrett MRN: [id number] DOB: 1971/06/16   CHIEF COMPLAINT:  EXCESSIVE DAYTIME SLEEPINESS   HISTORY OF PRESENT ILLNESS:  Karl Barrett is a 53 y.o. w/ a h/o HTN, DMII, anxiety and depression who presents for c/o loud snoring, witnessed apnea and excessive daytime sleepiness which has been present for several years. Reports nocturnal awakenings due to unclear reasons, and has difficulty falling back to sleep. Reports a 40 lb weight loss over the last few years. Admits to dry mouth, night sweats and morning headaches. Denies RLS symptoms, dream enactment, cataplexy, hypnagogic or hypnapompic hallucinations. Reports a family history of sleep apnea. Reports occasional drowsy driving. Drinks 8 sodas daily, occasional alcohol use, smokes 1 ppd tobacco use, denies illicit drug use.   Bedtime 10 pm- 12 am Sleep onset several hours Rise time 7 am   EPWORTH SLEEP SCORE 12    03/06/2024    9:00 AM  Results of the Epworth flowsheet  Sitting and reading 2  Watching TV 2  Sitting, inactive in a public place (e.g. a theatre or a meeting) 1  As a passenger in a car for an hour without a break 2  Lying down to rest in the afternoon when circumstances permit 1  Sitting and talking to someone 1  Sitting quietly after a lunch without alcohol 2  In a car, while stopped for a few minutes in traffic 1  Total score 12     PAST MEDICAL HISTORY :   has a past medical history of Anxiety, Arthritis, Barrett esophagus, Depression, Diabetes mellitus, type II (HCC), Hyperlipidemia, and Prediabetes.  has a past surgical history that includes Lithotripsy; Esophagogastroduodenoscopy (2010); Colonoscopy (2008); Esophagogastroduodenoscopy (02/02/2018); and Colonoscopy (02/02/2018). Prior to Admission medications   Medication Sig Start Date End Date Taking? Authorizing Provider  Accu-Chek Softclix Lancets lancets Use daily to monitor glucose before breakfast. 09/30/23  Yes Pardue, Lauraine SAILOR, DO   albuterol  (VENTOLIN  HFA) 108 (90 Base) MCG/ACT inhaler Inhale 1-2 puffs into the lungs every 6 (six) hours as needed for wheeze or shortness of breath. 10/20/23  Yes Pardue, Lauraine SAILOR, DO  Blood Glucose Monitoring Suppl (BLOOD GLUCOSE MONITOR SYSTEM) w/Device KIT Use daily before breakfast. May substitute to any manufacturer covered by patient's insurance. 09/30/23  Yes Pardue, Lauraine SAILOR, DO  budesonide -formoterol  (SYMBICORT ) 160-4.5 MCG/ACT inhaler Inhale 2 puffs into the lungs 2 (two) times daily. 08/19/23  Yes Emilio Marseille T, FNP  buPROPion  (WELLBUTRIN ) 75 MG tablet Take 1 tablet (75 mg total) by mouth in the morning. 12/22/23  Yes Eappen, Saramma, MD  Capsaicin  0.1 % CREA Apply 1 Application topically 4 (four) times daily as needed. 02/23/24  Yes Pardue, Lauraine SAILOR, DO  empagliflozin  (JARDIANCE ) 25 MG TABS tablet Take 1 tablet (25 mg total) by mouth daily. 12/23/23  Yes Pardue, Lauraine SAILOR, DO  escitalopram  (LEXAPRO ) 10 MG tablet Take 1 tablet (10 mg total) by mouth at bedtime. 08/19/23  Yes Emilio Marseille DASEN, FNP  glipiZIDE  (GLUCOTROL  XL) 10 MG 24 hr tablet Take 1 tablet (10 mg total) by mouth daily with breakfast. 11/23/23  Yes Pardue, Lauraine SAILOR, DO  Glucose Blood (BLOOD GLUCOSE TEST STRIPS) STRP Use daily before breakfast. May substitute to any manufacturer covered by patient's insurance. 09/30/23  Yes Pardue, Lauraine SAILOR, DO  hydrOXYzine  (ATARAX ) 10 MG tablet Take 1 tablet (10 mg total) by mouth 3 (three) times daily as needed for anxiety. 12/22/23  Yes Eappen, Saramma, MD  Lancet Device MISC 1 each by  Does not apply route daily before breakfast. May substitute to any manufacturer covered by patient's insurance. 09/30/23  Yes Pardue, Lauraine SAILOR, DO  lisinopril  (ZESTRIL ) 5 MG tablet Take 1 tablet (5 mg total) by mouth daily. 08/19/23  Yes Emilio Kelly DASEN, FNP  rosuvastatin  (CRESTOR ) 40 MG tablet Take 1 tablet (40 mg total) by mouth daily. 08/19/23  Yes Emilio Kelly T, FNP  sitaGLIPtin  (JANUVIA ) 100 MG tablet Take 1 tablet (100 mg  total) by mouth daily. 02/23/24  Yes Pardue, Lauraine SAILOR, DO  traMADol  (ULTRAM ) 50 MG tablet Take 1 tablet (50 mg total) by mouth every 12 (twelve) hours as needed for severe pain (pain score 7-10). 02/23/24  Yes Pardue, Lauraine SAILOR, DO  ciclopirox  (PENLAC ) 8 % solution Apply topically at bedtime. Apply over nail and surrounding skin. Apply daily over previous coat. After seven (7) days, may remove with alcohol and continue cycle. Patient not taking: Reported on 03/06/2024 11/17/23   Donzella Lauraine SAILOR, DO   Allergies  Allergen Reactions   Nsaids     Barrett Esophagus     FAMILY HISTORY:  family history includes ADD / ADHD in his daughter and niece; Asperger's syndrome in his paternal uncle; Diabetes in his father; Heart disease in his father and mother; Hypertension in his mother; Lung cancer in his maternal grandfather; Pancreatic cancer in his paternal grandfather; Pulmonary fibrosis in his mother; Thyroid disease in his mother. SOCIAL HISTORY:  reports that he has been smoking cigarettes. He has a 35 pack-year smoking history. He has been exposed to tobacco smoke. He has never used smokeless tobacco. He reports that he does not currently use alcohol. He reports that he does not use drugs.   Review of Systems:  Gen:  Denies  fever, sweats, chills weight loss  HEENT: Denies blurred vision, double vision, ear pain, eye pain, hearing loss, nose bleeds, sore throat Cardiac:  No dizziness, chest pain or heaviness, chest tightness,edema, No JVD Resp:   No cough, -sputum production, -shortness of breath,-wheezing, -hemoptysis,  Gi: Denies swallowing difficulty, stomach pain, nausea or vomiting, diarrhea, constipation, bowel incontinence Gu:  Denies bladder incontinence, burning urine Ext:   Denies Joint pain, stiffness or swelling Skin: Denies  skin rash, easy bruising or bleeding or hives Endoc:  Denies polyuria, polydipsia , polyphagia or weight change Psych:   Denies depression, insomnia or hallucinations   Other:  All other systems negative  VITAL SIGNS: BP 120/70 (BP Location: Right Arm, Patient Position: Sitting, Cuff Size: Large)   Pulse 62   Temp 98.1 F (36.7 C) (Oral)   Ht 6' 1 (1.854 m)   Wt 207 lb (93.9 kg)   SpO2 95%   BMI 27.31 kg/m    Physical Examination:   General Appearance: No distress  EYES PERRLA, EOM intact.   NECK Supple, No JVD Pulmonary: normal breath sounds, No wheezing.  CardiovascularNormal S1,S2.  No m/r/g.   Abdomen: Benign, Soft, non-tender. Skin:   warm, no rashes, no ecchymosis  Extremities: normal, no cyanosis, clubbing. Neuro:without focal findings,  speech normal  PSYCHIATRIC: Mood, affect within normal limits.   ASSESSMENT AND PLAN  OSA I suspect that OSA is likely present due to clinical presentation. Discussed the consequences of untreated sleep apnea. Advised not to drive drowsy for safety of patient and others. Will complete further evaluation with a home sleep study and follow up to review results.    HTN Stable, on current management. Following with PCP.   Insomnia Counseled patient on stimulus control  and improving sleep hygiene practices. Will also try patient on Trazodone  50 mg nightly.    MEDICATION ADJUSTMENTS/LABS AND TESTS ORDERED: Recommend Sleep Study   Patient  satisfied with Plan of action and management. All questions answered  Follow up to review HST results and treatment plan.   I spent a total of 50 minutes reviewing chart data, face-to-face evaluation with the patient, counseling and coordination of care as detailed above.    Theodis Kinsel, M.D.  Sleep Medicine [facility]

## 2024-03-17 ENCOUNTER — Other Ambulatory Visit: Payer: Self-pay

## 2024-03-22 ENCOUNTER — Ambulatory Visit: Admitting: Psychiatry

## 2024-03-23 ENCOUNTER — Ambulatory Visit (INDEPENDENT_AMBULATORY_CARE_PROVIDER_SITE_OTHER): Admitting: Licensed Clinical Social Worker

## 2024-03-23 DIAGNOSIS — Z91199 Patient's noncompliance with other medical treatment and regimen due to unspecified reason: Secondary | ICD-10-CM

## 2024-03-23 NOTE — Progress Notes (Signed)
 Clinician attempted session via face-to-face, but Karl Barrett did not appear for his session.

## 2024-04-06 ENCOUNTER — Other Ambulatory Visit: Payer: Self-pay

## 2024-04-06 ENCOUNTER — Telehealth: Payer: Self-pay

## 2024-04-06 NOTE — Progress Notes (Signed)
   04/06/2024  Patient ID: Karl Barrett, male   DOB: 1970/12/09, 53 y.o.   MRN: [id number]  Attempted to contact patient for medication management/review. Left HIPAA compliant message for patient to return my call at their convenience.   First attempt for patient outreach. Will follow up with patient in 3-7 business days.  Thank you for allowing pharmacy to be a part of this patient's care.  Dorcas Solian, PharmD Clinical Pharmacist Cell: [phone]

## 2024-04-11 ENCOUNTER — Encounter: Attending: Psychology | Admitting: Psychology

## 2024-04-11 DIAGNOSIS — R41840 Attention and concentration deficit: Secondary | ICD-10-CM | POA: Insufficient documentation

## 2024-04-11 NOTE — Progress Notes (Signed)
 NEUROPSYCHOLOGICAL EVALUATION [facility]  Physical Medicine and Rehabilitation     Patient: Karl Barrett  MRN: [id number] DOB: 04/28/1971  Age: 53 y.o. Sex: male  Race/Ethnicity: White or Caucasian  Years of Education: 12 Handedness: Right  Referring Provider: Eappen, Saramma, MD  Provider/Clinical Neuropsychologist: Evalene DOROTHA Riff, PsyD  Date of Service: 04/11/2024 Start Time: 8 AM End Time: 10 AM  Location of Service:  [facility] Physical Medicine & Rehabilitation Department [place]. [facility] [address] [place] [place], [place] [address] Phone: [phone]  Billing Code/Service:            96116/96121  Individuals Present: Patient was seen unaccompanied, in-person, by the provider. 1 hour and 15 minutes spent in face-to-face clinical interview and remaining 45 minutes was spent in record review, documentation, and testing protocol construction.    PATIENT CONSENT AND CONFIDENTIALITY The patient's understanding of the reason for referral was intact. Discussed limits of confidentiality including, but not limited to, posting of final evaluation report in the patient's electronic medical record for both the patient and for the referring provider and appropriate medical professionals. Patient was given the opportunity to have their questions answered. The neuropsychological evaluation process was discussed with the patient and they consented to proceed with the evaluation.  Consent for Evaluation and Treatment: Signed: Yes Explanation of Privacy Policies: Signed: Yes Discussion of Confidentiality Limits: Yes  REASON FOR REFERRAL:The patient was referred for neuropsychological evaluation by his psychiatrist, Dr. [name], whom he met with for follow-up for medication management on 12/22/2023.  Records indicate a history of depression and generalized anxiety disorder.  Records indicate some notable psychosocial stressors (family,  employment). At the time of his follow-up appointment he was unemployed and without income but was working with vocational rehabilitation for training.  He was referred for neuropsychological evaluation due to concerns for indications of ADHD given patient history of problems with attention and concentration and difficulties with reading and comprehension school.   HISTORY OF PRESENTING CONCERNS:  Educational History: The patient indicated that he withdrew from school in the 10th grade but went on to earn the credits he needed to complete high school through [employer] and finished around the age of 80.  The patient indicated that he had difficulty with reading comprehension growing up.  He indicated that his grades tended to be very strong when he put forth effort.  He indicated that his teachers would express frustration with him as they said I was smart but did not apply myself.  These difficulties applied to both middle school and high school.  He indicated that he had no difficulties with auditory-verbal comprehension growing up but struggled with reading as he would analyze every word and break it down, think I tried to make it more complicated than it was.  He denied clear indications of dyslexia.  He did not receive any supportive services in school.  He reported significant of qualities with getting himself to complete homework and that this was reasons he would fail subjects.  He indicated that he performed very well on tests however.  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  He indicated that he worked for an [employer] for about 3  years but left due to lack of support of support staff who were cut from the payroll.  He also  worked within another [employer] but struggled due to problems with his knees and shoulders. He indicated was not pursuing disability, and has been working with vocational rehabilitation.  He indicated the biggest challenge has been going back to school, and getting computer experience.  He indicated he is trying to go back to school to be an Insurance underwriter although worries about his ability to focus and concentrate and the impact on his coursework.  He recently found a new job and is currently working about 40 hours/week.  ADHD Symptomology - Adult (current and throughout adulthood): (Sx present for at least six months, lack clear indications of late onset, are present across settings, shown relative stability/are trait-like. Symptoms have negative impact on functioning / quality of life. Symptom descriptions are consistent with neurodevelopmental/ADHD-based etiology rather clearly secondary to psychiatric symptoms/conditions.  Attention Deficit Sx Endorsed ( 9 ): Often, difficulties with... Attention to detail/careless mistakes: Yes (tendency to make careless mistakes due to rushing, needing to go back and fix later) Sustained attention: Yes (difficulty sustaining attention in lecture, easily distracted by own thoughts) Attending when addressed directly: Yes Follow through on instructions / tasks:  Yes  Organizational difficulty: Yes (prioritizing, completing things in orderly manner/sequentially, keeping track of obligations etc.) Aversion / avoidance of sustained mental effort: Yes  Losing things necessary for tasks/activities: Yes  Easily distracted by extraneous stimuli: Yes (easily distracted and struggles to start up where left off) Forgetful in daily activities: Yes (needs reminders frequently) Hyperactivity/Impulsivity Sx Endorsed ( 3 ): Often, difficulties with... Fidgeting/squirming in seat: Yes Remaining seated / needing to move around: No Restlessness/runs or climbs when  inappropriate: Yes Engaging in leisure activities quietly: No Excessive energy: No Talking excessively: No Blurting out answers / conversational impulsivity/impatience: No Waiting turn: Yes Interruption or socially intrusive (conversation/behavior): No  ADHD Symptomology - Childhood (prior to age 69 years): Attention Deficit Sx Endorsed: Often, difficulties with... Attention to detail/careless mistakes: Uncertain Sustained attention: Yes Attending when addressed directly: Yes Follow through on instructions / tasks: Yes Organizational difficulty: Yes Aversion / avoidance of sustained mental effort: Yes Losing things necessary for tasks/activities: No Easily distracted by extraneous stimuli: Yes Forgetful in daily activities: Yes Hyperactivity/Impulsivity Sx Endorsed: Often, difficulties with... Fidgeting/squirming in seat: No  Remaining seated / needing to move around: No Restlessness/runs or climbs when inappropriate: No Engaging in leisure activities quietly: No Excessive energy: No Talking excessively: No Blurting out answers / conversational impulsivity/impatience: No Waiting turn: No Interruption or socially intrusive (conversation/behavior): No    Possible secondary factors impacting current Sx: Anxiety and depression. Sleep difficulties. Possible OSA. Complicating factors regarding childhood Sx: Family history includes ADD / ADHD in his daughter and niece; Asperger's syndrome in his paternal uncle    Functional Impact: Negative impact on workplace performance and difficulties, difficulties completing tasks, staying organized, prioritizing things. Described struggling with feeling overwhelmed with these issues and shutting down.   Motor/Sensory Complaints:   Sensory changes: Utilizes bifocals.  Denied significant hearing loss or need for use of hearing aids. Balance/coordination difficulties: Denied. Frequent instances of dizziness/vertigo: Periodic dizziness. Other  motor difficulties: No significant history of tremor.  Emotional and Behavioral Functioning:  Depression: Endorsed nonrunning history of depression dating to childhood.  Currently endorses frequent feelings of sadness, guilt, and hopelessness.  He describes difficulties with low energy and sleep problems.  He describes some indications of mild anhedonia.  Anxiety: Describes some performance related anxiety in addition to generalized anxiety disorder related symptoms.  Denied any history of panic.  Endorses difficulty with relaxing, feeling tense, racing thoughts impacting his ability to fall asleep. Other: Denied any history of trauma.  No history of homicidal ideation.  He endorsed history of passive suicidal ideation but no plan or history of attempts.  No history of paranoia, hallucinations, OCD, or indications of mania.  No history of psychiatric admissions.  He is currently working with psychiatry and has been referred for individual therapy although, at the time of this report, has not yet followed through.  He indicated he is currently living with his ex-wife but has plans to move out with his oldest daughter and her husband in the near future.  He indicated improvements in his stress upon finding his current position. Sleep: Sleeps approximately 5 to 8 hours per night.  Typically does not feel rested in the morning.  Often has difficulty falling asleep and struggles to get up in the morning.  Has trouble returning to sleep upon waking during the night approximately once or twice per week.  He indicated he believes he has been referred for a sleep apnea test but was not aware of the status. Appetite: Decent Caffeine: ~6 12 oz of caffeinated sodas per day. Alcohol Use: Drinks once or twice per month.  Averages 3-4 drinks in a given setting but this can increase up to 8-10.  His most regular use was drinking approximately 2-3 times per week, three large beers in each instance for about 10  years. Tobacco Use: Smokes 1 pack of cigarettes per day.  Has smoked since teenager.  Quit smoking for approximately 3 years at one point. Recreational Substance Use: None.  Psychosocial: Marital Status: Legally separated. Children/Grandchildren: Two Living Situation: Temporarily living with ex-wife and daughter. Daily activities/hobbies: Indicated not a lot right now  Level of Functional Independence: The patient is intact with basic activities of daily living.  Describes some difficulties with keeping track of medical appointments.  He is otherwise intact with instrumental activities of daily living.  Medical History/Record Review: Per records and patient report; History of traumatic brain injury/concussion: No History of stroke: No History of heart attack: No History of cancer/chemotherapy: No History of seizure activity: No Symptoms of chronic pain: Chronic pain on average 4 out of 10 in severity.   Experience of frequent headaches/migraines: Headaches once per week on average over the last 2 years.  Past Medical History:  Diagnosis Date   Anxiety    Arthritis    in the knees, shoulders, and potentially the neck   Barrett esophagus    Depression    Diabetes mellitus, type II (HCC)    Hyperlipidemia    Prediabetes    Patient Active Problem List   Diagnosis Date Noted   MDD (major depressive disorder), recurrent episode, mild (HCC) 02/23/2024   Elevated hemoglobin (HCC) 02/23/2024   Onychomycosis of great toe 11/17/2023   GAD (generalized anxiety disorder) 10/18/2023   Attention and concentration deficit 10/18/2023   Primary osteoarthritis involving multiple joints 08/19/2023   Depression 08/19/2023   Hypertension associated with diabetes (HCC) 08/19/2023   Uncontrolled type 2 diabetes mellitus with hyperglycemia (HCC) 08/19/2023   Type 2 diabetes mellitus with diabetic neuropathy, without long-term current use of insulin (HCC) 08/19/2023   Tobacco use disorder,  severe, dependence 08/19/2023   Pulmonary emphysema (HCC) 08/19/2023   Encounter for screening prostate specific antigen (PSA) measurement 08/19/2023   Annual physical  exam 08/19/2023   History of colon polyps 08/19/2023   Encounter for screening for HIV 08/19/2023   Encounter for hepatitis C screening test for low risk patient 08/19/2023   Cutaneous abscess of abdominal wall 08/19/2023   Hyperlipidemia associated with type 2 diabetes mellitus (HCC) 09/16/2017   Barrett's esophagus with dysplasia 02/19/2015   Family Neurologic/Medical Hx:  Family History  Problem Relation Age of Onset   Hypertension Mother    Heart disease Mother    Thyroid disease Mother    Pulmonary fibrosis Mother    Heart disease Father    Diabetes Father    Asperger's syndrome Paternal Uncle    Lung cancer Maternal Grandfather    Pancreatic cancer Paternal Grandfather    ADD / ADHD Daughter    ADD / ADHD Niece    Medications: The patient indicated he has not been taking any medication for about 1 month.  Mental Status/Behavioral Observations: The patient was seen on an outpatient basis in the [facility] office for the clinical interview unaccompanied. Sensorium/Arousal: The patient was alert.  Hearing and vision appeared adequate for the purpose of the visit. Orientation: Full. Appearance: Appropriate dress and hygiene for the setting. Behavior: Patient was cooperative.  Some minor indications of difficulties articulating thoughts at times. Speech/Language: Conversational speech was prosodic, fluent, and well-articulated. Motor: Within normal limits. Social Comportment: Appropriate for the setting. Mood: Neutral to anxious. Affect: Congruent. Thought Process/Content: Largely coherent, linear, and goal directed.  No indications of thought disorder. Ability to Participate in Interview: The patient readily answered all questions posed during the interview.  Additional follow-up questions were frequently  needed but the patient appeared to answer the best of his ability. Insight: Within normal limits.   SUMMARY / CLINICAL IMPRESSIONS The patient was referred for neuropsychological evaluation by his psychiatrist, Dr. [name], whom he met with for follow-up for medication management on 12/22/2023.  Records indicate a history of depression and generalized anxiety disorder.  Records indicate some notable psychosocial stressors (family, employment). At the time of his follow-up appointment he was unemployed and without income but was working with vocational rehabilitation for training.  He was referred for neuropsychological evaluation due to concerns for indications of ADHD given patient history of problems with attention and concentration and difficulties with reading and comprehension school.   The patient describes lifelong difficulties with attention and concentration dating back to childhood along with lifelong difficulties with depression and anxiety and struggles with reading comprehension.  He was never formally assessed or diagnosed with a learning disorder.  Upon interview, the patient endorses numerous symptoms within the inattentive symptom cluster of ADHD.  He describes difficulties as long running and impactful on both his current and past academic performance and his work history.  He endorses indications of active symptoms of depression and generalized anxiety at present.  He is experiencing some significant psychosocial stressors although one has been recently resolved with his finding employment and his housing situation appears likely to be resolved in the near future.  Factors present which could impact cognitive functioning beyond psychiatric interference involve significant sleep difficulties and potential obstructive sleep apnea (evaluation pending).  Neurocognitive evaluation be beneficial to assess for indications of ADHD related cognitive profiles and aid in differential diagnosis given  the patient's psychiatric and academic history.   DISPOSITION / PLAN The patient has been set up for a formal neuropsychological assessment to objectively assess her cognitive functioning across domains to establish the patient's cognitive profile. This data, in conjunction  with information obtained via clinical interview and medical record review, will help clarify likely etiology and guide treatment recommendations. Once data collection and interpretation have been completed, the findings / diagnosis and recommendations will be reviewed and discussed with the patient during a feedback appointment with the neuropsychologist. Based on the collaborative dialogue with the patient during the feedback, recommendations may be adjusted / tailored as needed. A formal report will be produced and provided to the patient and the referring provider.   Diagnosis: Attention and concentration deficit Major depressive disorder, recurrent, unspecified Generalized anxiety disorder            Karl DOROTHA Riff, PsyD             Neuropsychologist    This report was generated using voice recognition software. While this document has been carefully reviewed, transcription errors may be present. I apologize in advance for any inconvenience. Please contact me if further clarification is needed.

## 2024-04-17 ENCOUNTER — Encounter: Payer: Self-pay | Admitting: Psychology

## 2024-04-18 ENCOUNTER — Encounter

## 2024-04-18 DIAGNOSIS — R41840 Attention and concentration deficit: Secondary | ICD-10-CM

## 2024-04-18 NOTE — Progress Notes (Signed)
 Mental Status/Behavioral Observations (04/18/2024):  Sensory/Arousal: Hearing and vision were adequate for testing. The patient was alert. Appearance: Dress and hygiene were appropriate for the setting.   Speech/Language: In conversation, the patient's speech was prosodic, fluent, and well-articulated. The patient displayed no indications of word finding and no paraphasic errors were observed. There were no indications of problems with auditory-verbal comprehension based on the patient's behavior during testing.  Motor: The patient ambulated independently and without issue. No tremor was observed during testing.  Social Comportment: Social behavior was appropriate to the setting. Mood/Affect: Mood was neutral. Affect was consistent with mood.  Attention/Concentration: The patient participated in testing as instructed. No frank attentional lapses were appreciated. Thought Process/Content: The patient's thought process was coherent, linear, goal directed. There were no indications of psychosis.  Ability to Participate in Testing / Additional Observations: No difficulties understanding task instructions.   Neuropsychology Note Karl Barrett completed 135 minutes of neuropsychological testing with technician, Josue Ned, BA, under the supervision of Evalene Riff, PsyD., Clinical Neuropsychologist. The patient did not appear overtly distressed by the testing session, per behavioral observation or via self-report to the technician. Rest breaks were offered.   Clinical Decision Making: In considering the patient's current level of functioning, level of presumed impairment, nature of symptoms, emotional and behavioral responses during clinical interview, level of literacy, and observed level of motivation/effort, a battery of tests was selected by Dr. Riff during initial consultation on 04/11/2024. This was communicated to the technician. Communication between the neuropsychologist and technician  was ongoing throughout the testing session and changes were made as deemed necessary based on patient performance on testing, technician observations and additional pertinent factors such as those listed above.  Tests Administered: Conners Continuous Performance Test 3rd Edition (CPT-3) Delis-Kaplan Executive Function System (D-KEFS), select subtests Rey Complex Figure Test (RCFT), select subtests Wechsler Adult Intelligence Scale-Fourth Edition (WAIS-IV), select subtests Wechsler Abbreviated Scale of Intelligence Civil Service fast streamer) Wechsler Memory Scale-Third Edition (WMS-III), select subtests  [place]  Card Sorting Test [phone]) The Beck Depression Inventory-II (BDI-II) Beck Anxiety Inventory (BAI) Stand-alone and embedded performance validity metrics  Results: Note: This summary of test scores accompanies the interpretive report and should not be interpreted by unqualified individuals or in isolation without reference to the report. Test scores are relative to age, gender, and educational history as available and appropriate. Measurement properties of test scores: IQ, Index, and Standard Scores (SS): Mean = 100; Standard Deviation = 15; Scaled Scores (ss): Mean = 10; Standard Deviation = 3; Z scores (Z): Mean = 0; Standard Deviation = 1; T scores (T); Mean = 50; Standard Deviation = 10  Intellectual/Premorbid Functioning Estimate   Norm Score Percentile  Range  WASI-II FSIQ-2  SS = 102 55 %ile Average   Vocabulary   t = 48 42 %ile Average   Matrix Reasoning   t = 55 68 %ile Average   ATTENTION AND WORKING MEMORY    Norm Score Percentile  Range  WAIS-IV          Digit Span  ss = 9 37 %ile Average   DSF  ss = 11 63 %ile Average   Span:    7      DSB  ss = 10 50 %ile Average   Span:    5      DSS  ss = 7 16 %ile Low Average   Span:    4     WMS-III          Spatial  Span  ss = 12 75 %ile High Average   SSF  ss = 12 75 %ile High Average   Span:    7      SSB  ss = 10 50 %ile Average    Span:    5      PROCESSING SPEED    Norm Score Percentile  Range  WAIS-IV          Coding  ss = 6 9 %ile Low Average   Symbol Search  ss = 7 16 %ile Low Average    COMPLEX ATTENTION    Norm Score Percentile  Descriptor  CPT-3          Response Style       Balanced   Detectability (reverse scored)  t = 61 85 %ile Elevated   Omission Errors  t = 57 91 %ile High Average   Commission Errors  t = 56 75 %ile High Average   Perseverations  t = 55 82 %ile High Average   HRT (reaction time)  t = 63 91 %ile Slow   HRT SD (reaction time consistency)  t = 52 71 %ile Avg consistency   Variability (in RT consistency)  t = 60 90 %ile Elevated Var of RT consistency   HRT Block Change (over test)  t = 52 59 %ile Average   HRT ISI Change (by stimuli interval)  t = 50 51 %ile Average    EXECUTIVE FUNCTIONING    Norm Score Percentile  Range  DKEFS - Color-Word Interference          Color Naming  ss = 7 16 %ile Low Average   Word Reading  ss = 4 2 %ile Below Average   Inhibition  ss = 6 9 %ile Low Average   Errors  ss = 12 75 %ile High Average   Inhibition Switching  ss = 5 5 %ile Below Average   Errors  ss = 10 50 %ile Average  DKEFS - Trails          Condition 1  ss = 6 9 %ile Low Average   Condition II  ss = 9 37 %ile Average   Condition III  ss = 11 63 %ile Average   Condition IV  ss = 9 37 %ile Average   Condition V  ss = 12 75 %ile High Average  [place]  Card Sorting Test (WCST)          Total Errors  t = 49 45 %ile Average   Perseverative Errors  t = 47 37 %ile Average   Non-Perseverative Errors  t = 49 47 %ile Average   % Conceptual Responses  t = 49 45 %ile Average   Categories Completed     > 16 %ile WNL   Trials to First Category Complete     2 to 5 %ile Below Average   Failure to Maintain Set     > 16 %ile WNL   VISUAL-SPATIAL    Norm Score Percentile  Range  WAIS-IV          Block Design  ss = 11 63 %ile Average  WASI-II          Matrix Reasoning  t = 55 68 %ile Average   Rey Complex Figure Copy       > 16 %ile WNL   PERSONALITY AND BEHAVIORAL FUNCTIONING      Score/Interpretation  BDI Raw       27  BDI Severity       Moderate.  BAI Raw       15  BAI Severity       Mild.    Feedback to Patient: Karl Barrett will return on 04/27/2024 for an interactive feedback session with Dr. Hayden at which time his test performances, clinical impressions and treatment recommendations will be reviewed in detail. The patient understands he can contact our office should he require our assistance before this time.  135 minutes spent face-to-face with patient administering standardized tests, 30 minutes spent scoring Radiographer, therapeutic). [CPT [id number], [id number]]  Full report to follow.

## 2024-04-20 ENCOUNTER — Encounter (HOSPITAL_BASED_OUTPATIENT_CLINIC_OR_DEPARTMENT_OTHER): Admitting: Psychology

## 2024-04-20 DIAGNOSIS — F411 Generalized anxiety disorder: Secondary | ICD-10-CM | POA: Diagnosis not present

## 2024-04-20 DIAGNOSIS — F339 Major depressive disorder, recurrent, unspecified: Secondary | ICD-10-CM | POA: Diagnosis not present

## 2024-04-20 DIAGNOSIS — R41840 Attention and concentration deficit: Secondary | ICD-10-CM | POA: Diagnosis not present

## 2024-04-20 DIAGNOSIS — F900 Attention-deficit hyperactivity disorder, predominantly inattentive type: Secondary | ICD-10-CM

## 2024-04-25 ENCOUNTER — Encounter: Admitting: Psychology

## 2024-04-27 ENCOUNTER — Encounter: Attending: Psychology | Admitting: Psychology

## 2024-04-27 ENCOUNTER — Other Ambulatory Visit: Payer: Self-pay

## 2024-04-27 ENCOUNTER — Encounter: Payer: Self-pay | Admitting: Family Medicine

## 2024-04-27 ENCOUNTER — Ambulatory Visit (INDEPENDENT_AMBULATORY_CARE_PROVIDER_SITE_OTHER): Admitting: Family Medicine

## 2024-04-27 VITALS — BP 122/72 | HR 71 | Ht 74.0 in | Wt 210.0 lb

## 2024-04-27 DIAGNOSIS — Z716 Tobacco abuse counseling: Secondary | ICD-10-CM

## 2024-04-27 DIAGNOSIS — E1159 Type 2 diabetes mellitus with other circulatory complications: Secondary | ICD-10-CM

## 2024-04-27 DIAGNOSIS — E1140 Type 2 diabetes mellitus with diabetic neuropathy, unspecified: Secondary | ICD-10-CM | POA: Diagnosis not present

## 2024-04-27 DIAGNOSIS — F900 Attention-deficit hyperactivity disorder, predominantly inattentive type: Secondary | ICD-10-CM | POA: Insufficient documentation

## 2024-04-27 DIAGNOSIS — F17200 Nicotine dependence, unspecified, uncomplicated: Secondary | ICD-10-CM

## 2024-04-27 DIAGNOSIS — J439 Emphysema, unspecified: Secondary | ICD-10-CM

## 2024-04-27 DIAGNOSIS — I152 Hypertension secondary to endocrine disorders: Secondary | ICD-10-CM

## 2024-04-27 DIAGNOSIS — L0291 Cutaneous abscess, unspecified: Secondary | ICD-10-CM

## 2024-04-27 MED ORDER — CHLORHEXIDINE GLUCONATE 0.12 % MT SOLN
15.0000 mL | Freq: Two times a day (BID) | OROMUCOSAL | 0 refills | Status: AC
  Filled 2024-04-27: qty 473, 16d supply, fill #0

## 2024-04-27 MED ORDER — BUDESONIDE-FORMOTEROL FUMARATE 160-4.5 MCG/ACT IN AERO
2.0000 | INHALATION_SPRAY | Freq: Two times a day (BID) | RESPIRATORY_TRACT | 5 refills | Status: AC
  Filled 2024-04-27 (×2): qty 10.2, 30d supply, fill #0

## 2024-04-27 MED ORDER — SULFAMETHOXAZOLE-TRIMETHOPRIM 800-160 MG PO TABS
1.0000 | ORAL_TABLET | Freq: Two times a day (BID) | ORAL | 0 refills | Status: AC
  Filled 2024-04-27: qty 14, 7d supply, fill #0

## 2024-04-27 MED ORDER — SITAGLIPTIN PHOSPHATE 100 MG PO TABS
100.0000 mg | ORAL_TABLET | Freq: Every day | ORAL | 1 refills | Status: AC
  Filled 2024-04-27: qty 90, 90d supply, fill #0

## 2024-04-27 MED ORDER — EMPAGLIFLOZIN 25 MG PO TABS
25.0000 mg | ORAL_TABLET | Freq: Every day | ORAL | 1 refills | Status: AC
  Filled 2024-04-27: qty 90, 90d supply, fill #0

## 2024-04-27 MED ORDER — TRELEGY ELLIPTA 100-62.5-25 MCG/ACT IN AEPB: 1.0000 | INHALATION_SPRAY | Freq: Every day | RESPIRATORY_TRACT | Status: AC

## 2024-04-27 NOTE — Progress Notes (Signed)
 Established patient visit   Patient: Karl Barrett   DOB: [birth year]   53 y.o. Male  MRN: [id number] Visit Date: 04/27/2024  Today's healthcare provider: LAURAINE LOISE BUOY, DO   Chief Complaint  Patient presents with   Diabetes    Patient has not been checking his glucose at home.  He denies any hypoglycemic symptoms but does report an increase in fatigue.     Hypertension   Subjective    Diabetes  Hypertension    Karl Barrett is a 53 year old male with diabetes and hypertension who presents for follow-up.  He is not monitoring his blood glucose levels at home and denies hypoglycemic symptoms, but experiences increased fatigue. He is also not regularly checking his blood pressure but believes it has been generally stable. He is on lisinopril  5 mg daily.  He faces challenges with medication adherence due to his living situation but is optimistic about improvement as he has secured employment and plans to return to school. He experiences unilateral knee and shoulder pain, which he suspects may be related to a lateral meniscus issue. He has not attended physical therapy due to phone issues but has updated his contact information.  He experiences vertigo and unilateral puffiness and irritation in the right neck and tonsil area. He has a history of non-cancerous nasal polyps and reports nasal congestion and occasional discharge. He does not take daily allergy medication.  He has a history of smoking and has reduced his consumption to less than a pack a day (from 2+ ppd). He occasionally uses an inhaler for shortness of breath and fatigue. He has not used his Symbicort  inhaler regularly but reports relief when he does. He has a history of elevated hemoglobin, possibly related to smoking and potential sleep apnea.  He reports recurrent cysts in the groin and buttock area, which have increased over the past year. These are painful. He also two small, persistent areas on his right  thigh that look similar to bruises but have remain unchanged. He has a history of hemorrhoids and suspects a possible connection.  He has a history of shingles in childhood and has declined the shingles vaccine. He also declined the flu and pneumonia vaccines during this visit.      Medications: Outpatient Medications Prior to Visit  Medication Sig   Accu-Chek Softclix Lancets lancets Use daily to monitor glucose before breakfast.   albuterol  (VENTOLIN  HFA) 108 (90 Base) MCG/ACT inhaler Inhale 1-2 puffs into the lungs every 6 (six) hours as needed for wheeze or shortness of breath.   Blood Glucose Monitoring Suppl (BLOOD GLUCOSE MONITOR SYSTEM) w/Device KIT Use daily before breakfast. May substitute to any manufacturer covered by patient's insurance.   buPROPion  (WELLBUTRIN ) 75 MG tablet Take 1 tablet (75 mg total) by mouth in the morning.   ciclopirox  (PENLAC ) 8 % solution Apply topically at bedtime. Apply over nail and surrounding skin. Apply daily over previous coat. After seven (7) days, may remove with alcohol and continue cycle.   escitalopram  (LEXAPRO ) 10 MG tablet Take 1 tablet (10 mg total) by mouth at bedtime.   Glucose Blood (BLOOD GLUCOSE TEST STRIPS) STRP Use daily before breakfast. May substitute to any manufacturer covered by patient's insurance.   hydrOXYzine  (ATARAX ) 10 MG tablet Take 1 tablet (10 mg total) by mouth 3 (three) times daily as needed for anxiety.   Lancet Device MISC 1 each by Does not apply route daily before breakfast. May substitute to any  manufacturer covered by [employer].   lisinopril  (ZESTRIL ) 5 MG tablet Take 1 tablet (5 mg total) by mouth daily.   rosuvastatin  (CRESTOR ) 40 MG tablet Take 1 tablet (40 mg total) by mouth daily.   traZODone  (DESYREL ) 50 MG tablet Take 1 tablet (50 mg total) by mouth at bedtime.   [DISCONTINUED] budesonide -formoterol  (SYMBICORT ) 160-4.5 MCG/ACT inhaler Inhale 2 puffs into the lungs 2 (two) times daily.    [DISCONTINUED] empagliflozin  (JARDIANCE ) 25 MG TABS tablet Take 1 tablet (25 mg total) by mouth daily.   [DISCONTINUED] glipiZIDE  (GLUCOTROL  XL) 10 MG 24 hr tablet Take 1 tablet (10 mg total) by mouth daily with breakfast.   [DISCONTINUED] sitaGLIPtin  (JANUVIA ) 100 MG tablet Take 1 tablet (100 mg total) by mouth daily.   [DISCONTINUED] Capsaicin  0.1 % CREA Apply 1 Application topically 4 (four) times daily as needed. (Patient not taking: Reported on 04/27/2024)   No facility-administered medications prior to visit.        Objective    BP 122/72 (BP Location: Right Arm, Patient Position: Sitting, Cuff Size: Normal)   Pulse 71   Ht 6' 2 (1.88 m)   Wt 210 lb (95.3 kg)   SpO2 100%   BMI 26.96 kg/m     Physical Exam Vitals and nursing note reviewed.  Constitutional:      General: He is not in acute distress.    Appearance: Normal appearance.  HENT:     Head: Normocephalic and atraumatic.  Eyes:     General: No scleral icterus.    Conjunctiva/sclera: Conjunctivae normal.  Cardiovascular:     Rate and Rhythm: Normal rate.  Pulmonary:     Effort: Pulmonary effort is normal.  Skin:        Comments: Two small bruises (to inner right thigh) Two small small red irritated lumps  Neurological:     Mental Status: He is alert and oriented to person, place, and time. Mental status is at baseline.  Psychiatric:        Mood and Affect: Mood normal.        Behavior: Behavior normal.      No results found for any visits on 04/27/24.  Assessment & Plan    Type 2 diabetes mellitus with diabetic neuropathy, without long-term current use of insulin (HCC) -     Hemoglobin A1c -     Comprehensive metabolic panel with GFR -     Empagliflozin ; Take 1 tablet (25 mg total) by mouth daily.  Dispense: 90 tablet; Refill: 1 -     SITagliptin  Phosphate; Take 1 tablet (100 mg total) by mouth daily.  Dispense: 90 tablet; Refill: 1  Hypertension associated with diabetes (HCC) -     Empagliflozin ;  Take 1 tablet (25 mg total) by mouth daily.  Dispense: 90 tablet; Refill: 1  Pulmonary emphysema, unspecified emphysema type (HCC) -     Trelegy Ellipta ; Inhale 1 puff into the lungs daily. -     Budesonide -Formoterol  Fumarate; Inhale 2 puffs into the lungs 2 (two) times daily.  Dispense: 10.2 g; Refill: 5  Tobacco use disorder, severe, dependence -     Budesonide -Formoterol  Fumarate; Inhale 2 puffs into the lungs 2 (two) times daily.  Dispense: 10.2 g; Refill: 5  Encounter for smoking cessation counseling  Abscess -     Sulfamethoxazole -Trimethoprim ; Take 1 tablet by mouth 2 (two) times daily for 7 days.  Dispense: 14 tablet; Refill: 0 -     Chlorhexidine  Gluconate; Use as directed 15 mLs in  the mouth or throat 2 (two) times daily.  Dispense: 473 mL; Refill: 0      Type 2 diabetes mellitus with hyperglycemia and diabetic neuropathy Non-adherence to medication regimen due to living situation. Previous issues with glipizide  causing diarrhea. - Order hemoglobin A1c and metabolic panel. - Discontinue glipizide  due to diarrhea. - Encourage adherence to medication regimen. - Discuss setting alarms for medication reminders.  Essential hypertension Chronic, mild (stage 1), stable.  Blood pressure well-controlled today. Hypertension management important due to diabetes-related kidney risk.  Chronic obstructive pulmonary disease (COPD)/emphysema Not using Symbicort  regularly. Continues to smoke but has reduced to less than a pack a day. Previous relief with Symbicort . Discussed that regular use of inhalers can reduce fatigue and improve breathing, potentially lowering red blood cell count and reducing risk of blood clots. - Provide sample of Trelegy inhaler for trial. - Encourage regular use of Symbicort  otherwise. - Encourage smoking cessation. - Order sleep study to evaluate for sleep apnea.  Tobacco use disorder; smoking cessation counseling Continues to smoke but has reduced to less  than a pack a day. Expressed interest in quitting smoking in the future. Discussed benefits of quitting smoking, including improved breathing and reduced risk of blood clots. - Encourage smoking cessation. - Discuss benefits of quitting smoking.  Cutaneous abscesses of buttocks and groin Reports multiple abscesses in the groin and buttocks area, causing irritation and discomfort. Possible infection or abscess formation. - Prescribe Bactrim  for infection. - Recommend Hibiclens  wash for skin hygiene.  Polycythemia secondary to smoking and possible sleep apnea Persistently elevated hemoglobin, likely secondary to smoking and possible sleep apnea. Previous hematology evaluation negative for other causes. - Order sleep study to evaluate for sleep apnea.  Vertigo Continues to experience vertigo. Possible relation to chronic nasal/sinus symptoms. - Printed referral information for patient to contact ENT for further evaluation.  Chronic knee and shoulder pain Reports chronic pain in knees and shoulder, possibly related to lateral meniscus issues. No physical therapy completed due to phone issues. - Printed referral information for patient to contact physical therapy for further evaluation.  Chronic nasal/sinus symptoms with possible polyps Reports chronic nasal congestion and possible polyps. Previous ENT evaluation noted non-cancerous polyps. - Printed referral information for patient to contact ENT for further evaluation.   Chronic diarrhea Ongoing diarrhea, possibly related to glipizide  use.  Patient unsure if he has stopped his glipizide  or not. - Check if glipizide  remains among current medications and discontinue glipizide  if not already stopped.    Return in about 3 months (around 07/27/2024), or if symptoms worsen or fail to improve, for DM, Chronic f/u.      I discussed the assessment and treatment plan with the patient  The patient was provided an opportunity to ask questions and  all were answered. The patient agreed with the plan and demonstrated an understanding of the instructions.   The patient was advised to call back or seek an in-person evaluation if the symptoms worsen or if the condition fails to improve as anticipated.    LAURAINE LOISE BUOY, DO  [facility] [phone] (phone) [phone] (fax)  [facility]

## 2024-04-27 NOTE — Progress Notes (Signed)
 NEUROPSYCHOLOGICAL EVALUATION [place]. [facility]  Physical Medicine and Rehabilitation     Patient: Karl Barrett  MRN: [id number] DOB: [birth year]  Age: 53 y.o. Sex: male  Race/Ethnicity: White or Caucasian  Years of Education: 12 Handedness: Right  Referring Provider:  Eappen, Saramma, MD  Provider/Clinical Neuropsychologist: Evalene DOROTHA Riff, PsyD  Date of Service: 04/20/24 Start Time: 9 AM End Time: 10 AM  Location of Service:  [facility] Physical Medicine & Rehabilitation Department [place]. [facility] [address] [place]. [address] [place], [place] [address] Phone: [phone]  Billing Code/Service: [id number]  Individuals Present: Evalene Riff, PsyD 1 hour was spent on interpretation of patient data, interpretation of standardized test results and clinical data, clinical decision making, initial treatment planning/recommendations, and report writing. The report will be amended as needed based on any additional information collected during interactive feedback session.   REASON FOR REFERRAL:The patient was referred for neuropsychological evaluation by his psychiatrist, Dr. [name], whom he met with for follow-up for medication management on 12/22/2023.  Records indicate a history of depression and generalized anxiety disorder.  Records indicate some notable psychosocial stressors (family, employment). He was referred for neuropsychological evaluation due to concerns for indications of ADHD involving lifelong history of difficulties with attention and concentration and a family history of ADHD.   HISTORY OF PRESENTING CONCERNS:  Educational History: The patient indicated that he withdrew from school in the 10th grade but went on to earn the credits he needed to complete high school through [employer] and finished around the age of 1.  The patient indicated that he had difficulty with reading comprehension growing up.  He indicated that his grades tended to be very  strong when he put forth effort.  He indicated that his teachers would express frustration with him as they said I was smart but did not apply myself.  These difficulties applied to both middle school and high school.  He indicated that he had no difficulties with auditory-verbal comprehension growing up but struggled with reading as he would analyze every word and break it down, think I tried to make it more complicated than it was.  He denied clear indications of dyslexia but indicated that reading has always been challenging.  He did not receive any supportive services in school.  He reported significant difficulties with getting himself to complete homework and that this was reasons he would fail subjects.   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 Throughout his life, he tended to work multiple jobs, particularly when a young adult. In one of the last two jobs he had, he indicated that he worked for an Haematologist business for about 3 years but eventually left after the corporation reduced [employer] and had the mechanics take over those responsibilities in addition to existing ones, which was unsustainable. He indicated that difficulties with his back and knees led him to leave a body work position in the next instance. At the time of the initial referral, he was unemployed and working with vocational rehabilitation to find new work and retrain as needed. He indicated was not pursuing disability. 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 He indicated that wear  and tear on his body from labor work over the years is taking its toll and indicated that he feels he needs to  change professions. He indication his goal is to change careers and become an Public librarian, but he expressed concerns about his ability to retrain (e.g., develop competency with computers / office software, and learn the other things necessary for him to do well in that field). He described struggles with learning that persisted throughout adulthood, linked to symptoms of inattention consistent with ADHD, that he recognizes will be challenges in retraining.    ADHD Symptomology - Adult (current and throughout adulthood):  Note: Current symptoms lack clear indications of late onset, are present across settings, appear trait-like and present throughout adulthood based on patient descriptions. Endorsed symptoms have negative impact on functioning / quality of life. Endorsed symptom descriptions are not reliably accounted for by secondary factors and show overall alignment with difficulties associated with ADHD based on examples provided by the patient during follow-up questioning.  Attention Deficit Sx Endorsed ( 9 ): Often, difficulties with... Attention to detail/careless mistakes: Yes Sustained attention: Yes  Attending when addressed directly: Yes Follow through on instructions / tasks:  Yes  Aversion / avoidance of sustained mental effort: Yes  Losing things necessary for tasks/activities: Yes  Easily distracted by extraneous stimuli: Yes  Forgetful in daily activities: Yes  Hyperactivity/Impulsivity Sx Endorsed ( 3 ): Often, difficulties with... Fidgeting/squirming in seat: Yes Remaining seated / needing to move around: No Restlessness/runs or climbs when inappropriate: Yes Engaging in leisure activities quietly: No Excessive energy: No Talking excessively: No Blurting out answers / conversational impulsivity/impatience: No Waiting turn: Yes Interruption or socially intrusive (conversation/behavior): No ADHD Symptomology - Childhood (prior to age 72 years): Attention Deficit Sx  Endorsed: Often, difficulties with... Attention to detail/careless mistakes: Uncertain Sustained attention: Yes Attending when addressed directly: Yes Follow through on instructions / tasks: Yes Organizational difficulty: Yes Aversion / avoidance of sustained mental effort: Yes Losing things necessary for tasks/activities: No Easily distracted by extraneous stimuli: Yes Forgetful in daily activities: Yes Hyperactivity/Impulsivity Sx Endorsed: Often, difficulties with... Fidgeting/squirming in seat: No  Remaining seated / needing to move around: No Restlessness/runs or climbs when inappropriate: No Engaging in leisure activities quietly: No Excessive energy: No Talking excessively: No Blurting out answers / conversational impulsivity/impatience: No Waiting turn: No Interruption or socially intrusive (conversation/behavior): No   Possible secondary factors impacting current Sx: Anxiety and depression. Sleep difficulties. Possible OSA. Premorbid difficulties in reading. Environmental stressors.  Complicating factors regarding childhood Sx: Performance anxiety. Long running depression and anxiety.   Functional Impact: Negative impact on workplace performance requiring use of compensatory strategies that reduces efficiency. Difficulty learning new information in standard settings/formats (needs to learn via hands-on work) and difficulties with attention and concentration impact his ability to self-instruct when attempted. Difficulties completing tasks, staying organized, prioritizing things.   Motor/Sensory Complaints:   Sensory changes: Utilizes bifocals.  Denied significant hearing loss or need for use of hearing aids. Balance/coordination difficulties: Denied. Frequent instances of dizziness/vertigo: Periodic dizziness. Other motor difficulties: No significant history of tremor.  Emotional and Behavioral Functioning:  Depression: Endorsed long-running history of depression dating to  childhood.  Currently endorses frequent feelings of sadness, guilt, and hopelessness.  He describes difficulties with low energy and sleep problems.  He describes some indications of mild anhedonia. Anxiety: Describes some performance related anxiety in addition to generalized anxiety disorder related symptoms.  Denied any history of panic.  Endorses difficulty with relaxing, feeling  tense, racing thoughts impacting his ability to fall asleep. Other: Denied any history of trauma, although described significant interpersonal challenges in his prior marriage.  No history of homicidal ideation.  He endorsed history of passive suicidal ideation but no intent, plan, or history of attempts. He shows clear future orientation in his thinking.  No history of paranoia, hallucinations, OCD, or indications of mania.  No history of psychiatric admissions.  He is currently working with psychiatry and has been referred for individual therapy although. He has not yet met with a psychotherapist. He indicated he is currently living with his ex-wife but has plans to move out with his oldest daughter and her husband in the near future.  He indicated improvements in his stress upon finding his current position, and looking forward to increased stability in his housing situation.  Sleep: Sleeps approximately 5 to 8 hours per night.  Typically does not feel rested in the morning.  Often has difficulty falling asleep and struggles to get up in the morning.  Has trouble returning to sleep upon waking during the night approximately once or twice per week.  He indicated he believes he has been referred for a sleep apnea test but was not aware of the status. Appetite: Decent Caffeine: ~6 12 oz of caffeinated sodas per day. Alcohol Use: Drinks once or twice per month.  Averages 3-4 drinks in a given setting but this can increase up to 8-10.  His most regular use was drinking approximately 2-3 times per week, three large beers in each  instance for about 10 years. Tobacco Use: Smokes 1 pack of cigarettes per day.  Has smoked since teenager.  Quit smoking for approximately 3 years at one point.  He indicated he is interested in quitting smoking although recognizes that may be difficult not current circumstances (housing instability, stressors, etc.) Recreational Substance Use: None.  Psychosocial: Marital Status: Legally separated. Children/Grandchildren: Two Living Situation: Temporarily living with ex-wife and daughter. Daily activities/hobbies: Indicated not a lot right now  Level of Functional Independence: The patient is intact with basic activities of daily living.  Describes some difficulties with keeping track of medical appointments.  He is otherwise intact with instrumental activities of daily living.  Medical History/Record Review: Per records and patient report; History of traumatic brain injury/concussion: No History of stroke: No History of heart attack: No History of cancer/chemotherapy: No History of seizure activity: No Symptoms of chronic pain: Chronic pain on average 4 out of 10 in severity.   Experience of frequent headaches/migraines: Headaches once per week on average over the last 2 years.  Past Medical History:  Diagnosis Date   Anxiety    Arthritis    in the knees, shoulders, and potentially the neck   Barrett esophagus    Depression    Diabetes mellitus, type II (HCC)    Hyperlipidemia    Prediabetes    Patient Active Problem List   Diagnosis Date Noted   MDD (major depressive disorder), recurrent episode, mild (HCC) 02/23/2024   Elevated hemoglobin (HCC) 02/23/2024   Onychomycosis of great toe 11/17/2023   GAD (generalized anxiety disorder) 10/18/2023   Attention and concentration deficit 10/18/2023   Primary osteoarthritis involving multiple joints 08/19/2023   Depression 08/19/2023   Hypertension associated with diabetes (HCC) 08/19/2023   Uncontrolled type 2 diabetes mellitus  with hyperglycemia (HCC) 08/19/2023   Type 2 diabetes mellitus with diabetic neuropathy, without long-term current use of insulin (HCC) 08/19/2023   Tobacco use disorder, severe, dependence 08/19/2023  Pulmonary emphysema (HCC) 08/19/2023   Encounter for screening prostate specific antigen (PSA) measurement 08/19/2023   Annual physical exam 08/19/2023   History of colon polyps 08/19/2023   Encounter for screening for HIV 08/19/2023   Encounter for hepatitis C screening test for low risk patient 08/19/2023   Cutaneous abscess of abdominal wall 08/19/2023   Hyperlipidemia associated with type 2 diabetes mellitus (HCC) 09/16/2017   Barrett's esophagus with dysplasia 02/19/2015   Family Neurologic/Medical Hx:  Family History  Problem Relation Age of Onset   Hypertension Mother    Heart disease Mother    Thyroid disease Mother    Pulmonary fibrosis Mother    Heart disease Father    Diabetes Father    Asperger's syndrome Paternal Uncle    Lung cancer Maternal Grandfather    Pancreatic cancer Paternal Grandfather    ADD / ADHD Daughter    ADD / ADHD Niece    Medications: The patient indicated he has not been taking any medication for about 1 month.   NEUROPSYCHODIAGNOSTIC FINDINGS:  Mental Status/Behavioral Observations (04/18/2024):  Sensory/Arousal: Hearing and vision were adequate for testing. The patient was alert. Appearance: Dress and hygiene were appropriate for the setting.   Speech/Language: In conversation, the patient's speech was prosodic, fluent, and well-articulated. The patient displayed no indications of word finding and no paraphasic errors were observed. There were no indications of problems with auditory-verbal comprehension based on the patient's behavior during testing.  Motor: The patient ambulated independently and without issue. No tremor was observed during testing.  Social Comportment: Social behavior was appropriate to the setting. Mood/Affect: Mood was  neutral. Affect was consistent with mood.  Attention/Concentration: The patient participated in testing as instructed. No frank attentional lapses were appreciated. Thought Process/Content: The patient's thought process was coherent, linear, goal directed. There were no indications of psychosis.  Ability to Participate in Testing / Additional Observations: No difficulties understanding task instructions.  Tests Administered: Conners Continuous Performance Test 3rd Edition (CPT-3) Delis-Kaplan Executive Function System (D-KEFS), select subtests Rey Complex Figure Test (RCFT), select subtests Wechsler Adult Intelligence Scale-Fourth Edition (WAIS-IV), select subtests Wechsler Abbreviated Scale of Intelligence (WASI-II), select subtests Wechsler Memory Scale-Third Edition (WMS-III), select subtests  [place]  Card Sorting Test [phone]) The Beck Depression Inventory-II (BDI-II) Beck Anxiety Inventory (BAI) Stand-alone and embedded performance validity metrics  Results: Test scores are relative to age, gender, and educational history as available and appropriate. Measurement properties of test scores: IQ, Index, and Standard Scores (SS): Mean = 100; Standard Deviation = 15; Scaled Scores (ss): Mean = 10; Standard Deviation = 3; Z scores (Z): Mean = 0; Standard Deviation = 1; T scores (T); Mean = 50; Standard Deviation = 10  Performance Validity: The patient completed one stand-alone performance validity test and two measures with embedded performance with today metrics.  His primary score on the stand-alone performance validity test (DCT) was marginal, but secondary metrics adequate and his cognitive profile showed mild deficits that can account for the marginal score. His scores on embedded performance validity metrics are within normal limits.  The patient showed no behavioral indications of suboptimal engagement during testing.  The patient's overall performance validity and behavioral  observations support the interpretation of test data is reliable estimate of the patient's current cognitive abilities.  Intellectual/Premorbid Functioning Estimate   Norm Score Percentile  Range  WASI-II FSIQ-2  SS = 102 55 %ile Average   Vocabulary   t = 48 42 %ile Average   Matrix Reasoning   t =  55 68 %ile Average  Overall intellectual abilities are estimated to be within the average range on an abbreviated measure. He scored average on a measure of crystallized word knowledge (vocabulary) and on an abstract nonverbal reasoning task (matrix reasoning).    ATTENTION AND WORKING MEMORY    Norm Score Percentile  Range  WAIS-IV          Digit Span  ss = 9 37 %ile Average   DSF  ss = 11 63 %ile Average   Span:    7      DSB  ss = 10 50 %ile Average   Span:    5      DSS  ss = 7 16 %ile Low Average   Span:    4     WMS-III          Spatial Span  ss = 12 75 %ile High Average   SSF  ss = 12 75 %ile High Average   Span:    7      SSB  ss = 10 50 %ile Average   Span:    5     Performance on a measure of auditory-verbal attention and working memory scored average overall (digit span).  He scored average on the subtest of basic span (DSF).  His performance on subtests of the greater domains and working memory/mental manipulation was variable, ranging from the average (DSB) to low average range (DSS), with a weaker performance being in the task that added a sequencing element.   The patient performed slightly better on the visually based attention task (Spatial Span).  His overall performance was high average and subtest performances were high average to average.  There was no statistically significant score difference between subtest performances (i.e. over 1 SD difference  PROCESSING SPEED    Norm Score Percentile  Range  WAIS-IV          Coding  ss = 6 9 %ile Low Average   Symbol Search  ss = 7 16 %ile Low Average  The patient scored within the low average range on both measures of  processing speed.   COMPLEX ATTENTION    Norm Score Percentile  Descriptor  CPT-3          Response Style       Balanced   Detectability (reverse scored)  t = 61 85 %ile Elevated   Omission Errors  t = 57 91 %ile High Average   Commission Errors  t = 56 75 %ile High Average   Perseverations  t = 55 82 %ile High Average   HRT (reaction time)  t = 63 91 %ile Slow   HRT SD (reaction time consistency)  t = 52 71 %ile Avg consistency   Variability (in RT consistency)  t = 60 90 %ile Elevated Var of RT consistency   HRT Block Change (over test)  t = 52 59 %ile Average   HRT ISI Change (by stimuli interval)  t = 50 51 %ile Average  Patient completed a continuous performance test assessing for indications of impulsivity in multiple aspects of attention.  He engaged with the task as instructed showing a response style balancing speed and accuracy.  Metrics of impulsivity and vigilance were all within normal limits.  His score profile showed indications of difficulty with inattention (5 of 6 metrics elevated). He also showed variability in error rate over the course of the task suggestive of struggles with sustained attention.  EXECUTIVE FUNCTIONING    Norm Score Percentile  Range  DKEFS - Color-Word Interference          Color Naming  ss = 7 16 %ile Low Average   Word Reading  ss = 4 2 %ile Below Average   Inhibition  ss = 6 9 %ile Low Average   Errors  ss = 12 75 %ile High Average   Inhibition Switching  ss = 5 5 %ile Below Average   Errors  ss = 10 50 %ile Average  At patient's scores and baseline metrics showed low average color naming speed and below average word reading speed by age.  His speed based performance on the basic response inhibition trial was low average by age, but average relative to his baseline (Inhibition vs. Color Naming: ss=9, 37%ile) and he was high average in accuracy. On the trial combining response inhibition with set-shifting, the patient scored below average by age but  average relative to his baseline (Inhibition-Switching vs. Inhibition: ss=9, 37%ile). His accuracy was average on this trial.   DKEFS - Trails          Condition 1  ss = 6 9 %ile Low Average   Condition II  ss = 9 37 %ile Average   Condition III  ss = 11 63 %ile Average   Condition IV  ss = 9 37 %ile Average   Condition V  ss = 12 75 %ile High Average  [place]  Card Sorting Test (WCST)          Total Errors  t = 49 45 %ile Average   Perseverative Errors  t = 47 37 %ile Average   Non-Perseverative Errors  t = 49 47 %ile Average   % Conceptual Responses  t = 49 45 %ile Average   Categories Completed     > 16 %ile WNL   Trials to First Category Complete     2 to 5 %ile Below Average   Failure to Maintain Set     > 16 %ile WNL  On baseline metrics, the patient's rapid visual scanning speed was low average (Condition I) but gross motor speed was high average (Condition V). Number and letter sequencing speeds were both average by age (Conditions II and III). He had no difficulty on the combination sequencing + Set-shifting trial (Condition IV), scoring average by age.   The patient completed an applied reasoning test assessing problem solving, abstract concept formation, and cognitive flexibility. The patient had slight difficulty identifying the initial conceptually based rule-set (Below Average Trials to 1st Category Complete), but showed no difficulties with cognitive flexibility, abstract concept formation, or reasoning/problem solving as his scores on all other metrics were in line with expectations.   VISUAL-SPATIAL    Norm Score Percentile  Range  WAIS-IV          Block Design  ss = 11 63 %ile Average  WASI-II          Matrix Reasoning  t = 55 68 %ile Average  Rey Complex Figure Copy       > 16 %ile WNL  The patient's performance on visual-spatial tasks were all in line with expectations. He scored average on a visual construction task requiring him to replicate a two-dimensional figure  using three-dimensional blocks under timed conditions. His score on an abstract nonverbal reasoning task was average. He had no difficulty and scored within normal limits when required to copy a complex geometric figure.   PERSONALITY AND BEHAVIORAL FUNCTIONING  Score/Interpretation  BDI Raw       27  BDI Severity       Moderate.  BAI Raw       15  BAI Severity       Mild.  Self-report measures of depression and anxiety generated total score is within the moderate range for depression and mild severity range for anxiety.  SUMMARY / CLINICAL IMPRESSIONS The patient was referred for neuropsychological evaluation by his psychiatrist, Dr. [name], whom he met with for follow-up for medication management on 12/22/2023.  Records indicate a history of depression and generalized anxiety disorder.  Records indicate some notable psychosocial stressors (family, employment). He was referred for neuropsychological evaluation due to concerns for indications of ADHD involving lifelong history of difficulties with attention and concentration and a family history of ADHD.   The patient participated in a lengthy diagnostic clinical interview which assessed current and past behavioral and psychological functioning. The patient's descriptions of experiences during his youth were suggestive of ADHD symptomology, primarily attention-related, present prior to the age of 4. The patient's descriptions showed compelling evidence of current ADHD symptomology which appears to be trait-like / life-long. The patient does have a history of, and current difficulties with, depression and anxiety. Similarly, the patient may have OSA, and hopes to undergo a sleep study. Based on data from the in-depth clinical interview, and follow-up interviewing at the feedback appointment, difficulties with mood and anxiety appear to be playing an exacerbatory role in his cognitive difficulties, but do not appear to be the primary cause of his  symptoms. Similarly, OSA (if present) would exacerbate difficulties but would not adequately account for several ADHD related symptoms nor consistency over time or presence during young adulthood when OSA would be unlikely.   On testing, areas of difficulty primarily involved speed and attention. He was low average on measures focused on processing speed. His score profile on the CPT3 showed indications of improved accuracy under longer inter-stimulus intervals. Similarly, his reaction time on the CPT3 was slower than the norm overall.  In contrast, his speed based performances were all average by age on sequencing (Trails) with and without set-shifting, and this task has notable visual scanning demands. He also performed well on a speeded visual construction task (block design). He showed some difficulty with attention and concentration. There were subtle indications of difficulty with basic auditory-verbal attention (score variability, some early item errors) and on measures of inattention and sustained attention on the visually-based continuous performance test. However, he did well on a basic visual attention task.   Test scores showed average range abilities in other areas. Overall premorbid and intellectual ability is estimated to be within the average range. He had no difficulty with visual-spatial skills. His performances in executive functioning showed no compelling indications of impairment when assessed relative to his baseline performance metrics. He showed minor difficulty with initial concept formation on an applied reasoning task, but was intact on all other metrics of that test.   Based on information obtained from an atypically in-depth clinical interview, and judged in reference to the patient's specific constellation of ADHD related symptomology, I believe the patient's symptoms reflect ongoing persistence of ADHD. The specific functional difficulties and symptoms he describes, indications  of stability in symptom course, specific reactions to certain cognitive/physical environments, current and past descriptions of behavioral alterations consistent with efforts to adapt, and even psychosocial/life difficulties known to be more prevalent in adults with ADHD all point to this etiology. Cognitive test data  shows indications of difficulty with attention and concentration, and processing speed. These difficulties align with a deficit pattern seen in some individuals with ADHD. This may be the case with the patient as well. However, based on behavioral observations and (and recognition of limitations inherent in cognitive performance measures) I suspect there may be vigilance based deficits, ones that could potentially mimic his particular score profile. In either case, a formal diagnosis of ADHD is appropriate.   He has active symptoms of depression, anxiety, and possible untreated OSA. I do not feel these can adequately account for his symptoms. I do believe they are exacerbating underlying difficulties and must be addressed. I communicated as such to the patient during the feedback appointment, along with discussions about the patient's goals, and importance of behavioral/environmental adaptation to facilitate his functioning. While the anxiety, depression, and possible OSA require attention, I believe stimulant medication options would be appropriate for ADHD, although such decisions are ultimately deferred to the referring physician.   Diagnosis: Attention deficit hyperactivity disorder (ADHD), predominantly inattentive type  Major depressive disorder, recurrent, unspecified Generalized anxiety disorder  Recommendations:  Follow-up with the referring provider (Dr. Eappen) as planned. I believe stimulant medication would be appropriate to trial. The patient showed, without prompting, awareness of risks of exacerbation of anxiety from stimulant medication. Beyond suspicion of vigilance  deficits, which benefits from stimulant medication, assessment determination of appropriate medication options and specific medication recommendations and decisions are beyond the scope of my practice, and I defer to the judgment of treating / prescribing physicians.  Proceeding with the sleep study recommended by your physicians is strongly recommended. As discussed, continue with the plan for follow-up. If OSA is present and untreated, it is likely to have negative impacts on your sleep quality. This impacts your cognitive abilities (attention/concentration, working memory, processing speed, and memory in general), as does poor sleep quality for any individual. Poor quality sleep can also impact mood, anxiety, and make it more difficult to cope with stress. Also, as discussed in the feedback, untreated sleep apnea has a negative impact on cerebrovascular health, and can increase risk for cerebrovascular disease, which is a risk factor for dementia. Evaluation (and possibly treatment) for OSA is therefore important for both short term (cognition and mental health) and long term (neurological health).  Working on improving struggles with depression and anxiety is something I strongly recommend. While some stressors are situation, and appear to be time limited, working with mental health providers (psychiatry, psychotherapy) is recommended. It can be helpful for navigating this challenging time, but also because anxiety and depression have been long running challenges.  As discussed, communicate with your doctor about medications, including any concerns you have about side effects.  Individual therapy would likely be beneficial based on data from the evaluation but also based on additional information I learned during the feedback session. I believe you would do quite well in therapy given your insight, motivation, communication skills, and details from our discussion that showed sophisticated understanding of  the minds of others. I believe therapy would provide you valuable information and possible grounding and structure which would be beneficial. In some ways, simply having a space to process may be helpful itself. Per discussion, topics / targets for therapy; interpersonal relationships/boundaries, self-care, strategies for managing rumination/worry, general intervention for depression and anxiety, behavioral strategies for managing ADHD, supports and coping skills during life transitions and associated stressors.   As discussed with the patient during the feedback, the patient describes having difficulties  with reading comprehension. Difficulties do not clearly align with common difficulties associated with reading disorders (Difficulties sounded almost hyperlexia related, rather than problems related to dyslexia, or language based impairments impacting comprehension). This was not assessed directly in this evaluation. I suspect reading comprehension difficulties is attention based, but cannot say for certain without comprehensive reading and language assessment. The option for additional testing with the current evaluation / episode was offered. In discussion with the patient, the plan is to hold off on further assessment of reading difficulties at this time, and the patient will reach out if this is something he wishes to pursue in the future.  In terms of learning new computer skills for future employment, I encourage the patient to tinker and play around with things (e.g., Microsoft Office [Word, Excel, etc.]) given his tendencies to do well when working hands on and less so trying to learn in traditional classroom settings.  He described thinking of writing a story on the computer, which would be an excellent way to learn how to use word.   I (very briefly) showed him how an excel sheet can be used to make a list, where you can move items around, and which can even serve as a way of organizing a task  list.  While chat GPT and other AI are unreliable for many things, it can be very useful for answering questions about using software. A google search involving a direct question often provides concrete and clear answers, simple or complex, with software (e.g., What program can I use to write a letter?, How do I print?, How do I make text bigger in Microsoft Word?, What formula can I use to average cells in Excel?) Management of ADHD symptoms in day to day life can be broken down into two broad categories; environment, and approach. I'm providing a number of handouts with cognitive strategies / symptom management strategies that I believe may be helpful for this (Attached to copy sent to patient in mail). Continue working with your health providers for management of any chronic medical conditions. Managing cardiovascular health and diabetes is important for your long term health (including neurological health).  Do not neglect managing your diabetes. Poor management can indirectly impact your daily functioning (fatigue, mental fog, etc.) Please don't hesitate to reach out with any questions about anything in this report, or any new questions about the results or recommendations after receiving this copy.               Evalene DOROTHA Riff, PsyD             Neuropsychologist    This report was generated using voice recognition software. While this document has been carefully reviewed, transcription errors may be present. I apologize in advance for any inconvenience. Please contact me if further clarification is needed.

## 2024-04-28 LAB — HEMOGLOBIN A1C
Est. average glucose Bld gHb Est-mCnc: 194 mg/dL
Hgb A1c MFr Bld: 8.4 % — ABNORMAL HIGH (ref 4.8–5.6)

## 2024-04-28 LAB — COMPREHENSIVE METABOLIC PANEL WITH GFR
ALT: 19 IU/L (ref 0–44)
AST: 12 IU/L (ref 0–40)
Albumin: 4.1 g/dL (ref 3.8–4.9)
Alkaline Phosphatase: 71 IU/L (ref 44–121)
BUN/Creatinine Ratio: 12 (ref 9–20)
BUN: 9 mg/dL (ref 6–24)
Bilirubin Total: 0.8 mg/dL (ref 0.0–1.2)
CO2: 21 mmol/L (ref 20–29)
Calcium: 9.3 mg/dL (ref 8.7–10.2)
Chloride: 97 mmol/L (ref 96–106)
Creatinine, Ser: 0.76 mg/dL (ref 0.76–1.27)
Globulin, Total: 2 g/dL (ref 1.5–4.5)
Glucose: 412 mg/dL — ABNORMAL HIGH (ref 70–99)
Potassium: 4.2 mmol/L (ref 3.5–5.2)
Sodium: 135 mmol/L (ref 134–144)
Total Protein: 6.1 g/dL (ref 6.0–8.5)
eGFR: 106 mL/min/1.73 (ref >59)

## 2024-04-29 NOTE — Progress Notes (Signed)
   NEUROPSYCHOLOGICAL EVALUATION [place]  Physical Medicine and Rehabilitation     Patient: Karl Barrett  MRN: [id number] DOB: 30-Jun-1971   Service Provider/Clinical Neuropsychologist: Evalene DOROTHA Riff, PsyD  Date of Service: 04/27/24 Start Time: 4 PM End Time: 5 40 PM  Location of Service:  [facility] Physical Medicine & Rehabilitation Department [facility] [address], [place]. [address] [place], [place] [address] Phone: [phone]   Billing Code/Service: [id number]    Individuals present: Patient, Provider Laurier DOROTHA Riff, PsyD)  Provider conducted the 100-minute interactive feedback appointment in-person with the patient. The extended session was required for the additional time in discussion, psychoeducation, and psychotherapeutic supports provided while tailoring treatment recommendations. The provider reviewed and discussed the results of neuropsychological evaluation. Follow-up interviewing was conducted as needed to refine interpretation of findings as needed. Review of results included overall findings, diagnosis, and treatment planning/recommendations that were derived from integration of patient data, interpretation of standardized rest results and clinical data, and clinical decision making, which are documented in the patient's electronic medical record with the full report (date listed below). A copy of the full report will also be mailed to the patient.   The patient expressed understanding of the information reviewed. The patient was provided opportunity to ask questions which were then answered by the provider. The provider worked collaboratively to tailor treatment recommendations to the patient when possible. The patient was informed they could reach out to the provider should additional questions related to the evaluation arise.    The final neuropsychological evaluation report, documented in the patient's chart on 04/20/24, was  amended to reflect any additional information obtained during the feedback appointment including treatment planning collaboration.               Evalene DOROTHA Riff, PsyD             Neuropsychologist    This report was generated using voice recognition software. While this document has been carefully reviewed, transcription errors may be present. I apologize in advance for any inconvenience. Please contact me if further clarification is needed.

## 2024-05-08 ENCOUNTER — Ambulatory Visit: Payer: Self-pay | Admitting: Family Medicine

## 2024-05-22 ENCOUNTER — Ambulatory Visit: Admitting: Dietician

## 2024-05-24 NOTE — Progress Notes (Deleted)
 Diabetes Self-Management Education  Visit Type:    Appt. Start Time: *** Appt. End Time: ***  05/24/2024  [name], identified by name and date of birth, is a 53 y.o. male with a diagnosis of Diabetes:  .   ASSESSMENT *** Patient is here today alone. Pt reports he has ongoing shoulder and knee pain and desires surgery.  Pt reports symptoms occurring stating diarrheaand feels this is related to side effects of medications and states he discontinues administration at times. Patient would like to learn what to eat for diabetes. Patient lives with family.  Pt reports he does his own shopping and cooking. Pt reports pain interferes with sleep and exercise. Pt reports he sees a therapist once every three months. Pt reports daily intake of sugary sweetened beverages.  All Pt's questions were answered during this encounter.   History includes:   Past Medical History:  Diagnosis Date   Anxiety    Arthritis    in the knees, shoulders, and potentially the neck   Barrett esophagus    Depression    Diabetes mellitus, type II (HCC)    Hyperlipidemia    Prediabetes     Medications include:   Current Outpatient Medications:    Accu-Chek Softclix Lancets lancets, Use daily to monitor glucose before breakfast., Disp: 100 each, Rfl: 0   albuterol  (VENTOLIN  HFA) 108 (90 Base) MCG/ACT inhaler, Inhale 1-2 puffs into the lungs every 6 (six) hours as needed for wheeze or shortness of breath., Disp: 54 g, Rfl: 0   Blood Glucose Monitoring Suppl (BLOOD GLUCOSE MONITOR SYSTEM) w/Device KIT, Use daily before breakfast. May substitute to any manufacturer covered by patient's insurance., Disp: 1 kit, Rfl: 0   budesonide -formoterol  (SYMBICORT ) 160-4.5 MCG/ACT inhaler, Inhale 2 puffs into the lungs 2 (two) times daily., Disp: 10.2 g, Rfl: 5   buPROPion  (WELLBUTRIN ) 75 MG tablet, Take 1 tablet (75 mg total) by mouth in the morning., Disp: 30 tablet, Rfl: 1   chlorhexidine  (PERIDEX ) 0.12 % solution, Use as  directed 15 mLs in the mouth or throat 2 (two) times daily., Disp: 473 mL, Rfl: 0   ciclopirox  (PENLAC ) 8 % solution, Apply topically at bedtime. Apply over nail and surrounding skin. Apply daily over previous coat. After seven (7) days, may remove with alcohol and continue cycle., Disp: 6.6 mL, Rfl: 0   empagliflozin  (JARDIANCE ) 25 MG TABS tablet, Take 1 tablet (25 mg total) by mouth daily., Disp: 90 tablet, Rfl: 1   escitalopram  (LEXAPRO ) 10 MG tablet, Take 1 tablet (10 mg total) by mouth at bedtime., Disp: 90 tablet, Rfl: 1   Fluticasone-Umeclidin-Vilant (TRELEGY ELLIPTA ) 100-62.5-25 MCG/ACT AEPB, Inhale 1 puff into the lungs daily., Disp: , Rfl:    Glucose Blood (BLOOD GLUCOSE TEST STRIPS) STRP, Use daily before breakfast. May substitute to any manufacturer covered by patient's insurance., Disp: 100 strip, Rfl: 3   hydrOXYzine  (ATARAX ) 10 MG tablet, Take 1 tablet (10 mg total) by mouth 3 (three) times daily as needed for anxiety., Disp: 90 tablet, Rfl: 1   Lancet Device MISC, 1 each by Does not apply route daily before breakfast. May substitute to any manufacturer covered by patient's insurance., Disp: 1 each, Rfl: 0   lisinopril  (ZESTRIL ) 5 MG tablet, Take 1 tablet (5 mg total) by mouth daily., Disp: 90 tablet, Rfl: 3   rosuvastatin  (CRESTOR ) 40 MG tablet, Take 1 tablet (40 mg total) by mouth daily., Disp: 90 tablet, Rfl: 3   sitaGLIPtin  (JANUVIA ) 100 MG tablet, Take 1  tablet (100 mg total) by mouth daily., Disp: 90 tablet, Rfl: 1   traZODone  (DESYREL ) 50 MG tablet, Take 1 tablet (50 mg total) by mouth at bedtime., Disp: 30 tablet, Rfl: 3   Labs noted:   Lab Results  Component Value Date   HGBA1C 8.4 (H) 04/27/2024   Lab Results  Component Value Date   CHOL 157 11/26/2023   HDL 48 11/26/2023   LDLCALC 90 11/26/2023   TRIG 101 11/26/2023   CHOLHDL 3.3 11/26/2023   Lab Results  Component Value Date   CHOL 157 11/26/2023   HDL 48 11/26/2023   LDLCALC 90 11/26/2023   TRIG 101  11/26/2023   CHOLHDL 3.3 11/26/2023   There were no vitals taken for this visit. There is no height or weight on file to calculate BMI. Wt Readings from Last 3 Encounters:  04/27/24 210 lb (95.3 kg)  03/06/24 207 lb (93.9 kg)  02/23/24 204 lb 3.2 oz (92.6 kg)      Individualized Plan for Diabetes Self-Management Training:   Learning Objective:  Patient will have a greater understanding of diabetes self-management. Patient education plan is to attend individual and/or group sessions per assessed needs and concerns.   Plan:   There are no Patient Instructions on file for this visit.  Expected Outcomes:     Education material provided: ADA - How to Thrive: A Guide for Your Journey with Diabetes, My Plate, Snack sheet, and Diabetes Resources  If problems or questions, patient to contact team via:  Phone  Future DSME appointment:

## 2024-05-25 ENCOUNTER — Telehealth: Payer: Self-pay

## 2024-05-25 NOTE — Progress Notes (Signed)
 Complex Care Management Note  Care Guide Note 05/25/2024 Name: Sarim Rothman MRN: [id number] DOB: [birth year]  Blade Scheff is a 53 y.o. year old male who sees Pardue, Lauraine SAILOR, DO for primary care. I reached out to [name] by phone today to offer complex care management services.  Mr. Trimpe was given information about Complex Care Management services today including:   The Complex Care Management services include support from the care team which includes your Nurse Care Manager, Clinical Social Worker, or Pharmacist.  The Complex Care Management team is here to help remove barriers to the health concerns and goals most important to you. Complex Care Management services are voluntary, and the patient may decline or stop services at any time by request to their care team member.   Complex Care Management Consent Status: Patient did not agree to participate in complex care management services at this time.  Follow up plan:  Patient will follow up with PCP.   Encounter Outcome:  Patient Refused  [place]ream[facility] Agent [facility] VBCI Assistant Direct Dial: [phone]  Fax: [phone]

## 2024-05-29 ENCOUNTER — Ambulatory Visit: Admitting: Dietician

## 2024-05-29 DIAGNOSIS — E1140 Type 2 diabetes mellitus with diabetic neuropathy, unspecified: Secondary | ICD-10-CM

## 2024-06-14 ENCOUNTER — Ambulatory Visit

## 2024-06-14 ENCOUNTER — Telehealth: Payer: Self-pay | Admitting: *Deleted

## 2024-06-14 DIAGNOSIS — G4733 Obstructive sleep apnea (adult) (pediatric): Secondary | ICD-10-CM

## 2024-06-14 NOTE — Telephone Encounter (Signed)
 Copied from CRM [id number]. Topic: Clinical - Order For Equipment >> Jun 13, 2024  3:42 PM Karl Barrett wrote: Reason for CRM: Patient called regarding order for sleep study, pt stated he received a phone call and was returning it. Contacted [facility] spoke with [facility] stated no documentation regarding call. Pt disconnected call while on hold/ Please f/u with pt regarding this.  He was called about him picking up his sleep study kit in [place].  He wanted to know where the office was located.  Advised it was at [facility] in the [facility] building on the 1st floor.  He verbalized understanding.  Nothing further needed.

## 2024-07-03 DIAGNOSIS — G4733 Obstructive sleep apnea (adult) (pediatric): Secondary | ICD-10-CM | POA: Diagnosis not present

## 2024-07-04 ENCOUNTER — Ambulatory Visit: Payer: Self-pay

## 2024-07-11 ENCOUNTER — Encounter: Payer: Self-pay | Admitting: Sleep Medicine

## 2024-07-11 ENCOUNTER — Ambulatory Visit: Admitting: Sleep Medicine

## 2024-07-11 VITALS — BP 122/72 | HR 68 | Temp 97.8°F | Ht 74.0 in | Wt 219.6 lb

## 2024-07-11 DIAGNOSIS — I10 Essential (primary) hypertension: Secondary | ICD-10-CM

## 2024-07-11 DIAGNOSIS — G4733 Obstructive sleep apnea (adult) (pediatric): Secondary | ICD-10-CM | POA: Diagnosis not present

## 2024-07-11 NOTE — Patient Instructions (Signed)

## 2024-07-11 NOTE — Progress Notes (Signed)
 Name:Karl Barrett MRN: [id number] DOB: 1971-08-07   CHIEF COMPLAINT:  HST F/U   HISTORY OF PRESENT ILLNESS:  Karl Barrett is a 53 y.o. w/ a h/o HTN, DMII, anxiety and depression who presents to follow up on HST results. The patient underwent HST which revealed mild OSA (AHI 6, O2 nadir 81%).    EPWORTH SLEEP SCORE 12    03/06/2024    9:00 AM  Results of the Epworth flowsheet  Sitting and reading 2  Watching TV 2  Sitting, inactive in a public place (e.g. a theatre or a meeting) 1  As a passenger in a car for an hour without a break 2  Lying down to rest in the afternoon when circumstances permit 1  Sitting and talking to someone 1  Sitting quietly after a lunch without alcohol 2  In a car, while stopped for a few minutes in traffic 1  Total score 12    PAST MEDICAL HISTORY :   has a past medical history of Anxiety, Arthritis, Barrett esophagus, Depression, Diabetes mellitus, type II (HCC), Hyperlipidemia, and Prediabetes.  has a past surgical history that includes Lithotripsy; Esophagogastroduodenoscopy (2010); Colonoscopy (2008); Esophagogastroduodenoscopy (02/02/2018); and Colonoscopy (02/02/2018). Prior to Admission medications   Medication Sig Start Date End Date Taking? Authorizing Provider  Accu-Chek Softclix Lancets lancets Use daily to monitor glucose before breakfast. 09/30/23  Yes Karl Barrett SAILOR, Barrett  albuterol  (VENTOLIN  HFA) 108 (90 Base) MCG/ACT inhaler Inhale 1-2 puffs into the lungs every 6 (six) hours as needed for wheeze or shortness of breath. 10/20/23  Yes Karl Barrett SAILOR, Barrett  Blood Glucose Monitoring Suppl (BLOOD GLUCOSE MONITOR SYSTEM) w/Device KIT Use daily before breakfast. May substitute to any manufacturer covered by patient's insurance. 09/30/23  Yes Karl Barrett SAILOR, Barrett  budesonide -formoterol  (SYMBICORT ) 160-4.5 MCG/ACT inhaler Inhale 2 puffs into the lungs 2 (two) times daily. 08/19/23  Yes Karl Barrett  buPROPion  (WELLBUTRIN ) 75 MG tablet  Take 1 tablet (75 mg total) by mouth in the morning. 12/22/23  Yes Karl Barrett  Capsaicin  0.1 % CREA Apply 1 Application topically 4 (four) times daily as needed. 02/23/24  Yes Karl Barrett SAILOR, Barrett  empagliflozin  (JARDIANCE ) 25 MG TABS tablet Take 1 tablet (25 mg total) by mouth daily. 12/23/23  Yes Karl Barrett SAILOR, Barrett  escitalopram  (LEXAPRO ) 10 MG tablet Take 1 tablet (10 mg total) by mouth at bedtime. 08/19/23  Yes Karl Barrett  glipiZIDE  (GLUCOTROL  XL) 10 MG 24 hr tablet Take 1 tablet (10 mg total) by mouth daily with breakfast. 11/23/23  Yes Karl Barrett SAILOR, Barrett  Glucose Blood (BLOOD GLUCOSE TEST STRIPS) STRP Use daily before breakfast. May substitute to any manufacturer covered by patient's insurance. 09/30/23  Yes Karl Barrett SAILOR, Barrett  hydrOXYzine  (ATARAX ) 10 MG tablet Take 1 tablet (10 mg total) by mouth 3 (three) times daily as needed for anxiety. 12/22/23  Yes Karl Barrett  Lancet Device MISC 1 each by Does not apply route daily before breakfast. May substitute to any manufacturer covered by patient's insurance. 09/30/23  Yes Karl Barrett SAILOR, Barrett  lisinopril  (ZESTRIL ) 5 MG tablet Take 1 tablet (5 mg total) by mouth daily. 08/19/23  Yes Karl Barrett  rosuvastatin  (CRESTOR ) 40 MG tablet Take 1 tablet (40 mg total) by mouth daily. 08/19/23  Yes Karl Barrett  sitaGLIPtin  (JANUVIA ) 100 MG tablet Take 1 tablet (100 mg total) by mouth daily. 02/23/24  Yes  Karl Barrett  traMADol  (ULTRAM ) 50 MG tablet Take 1 tablet (50 mg total) by mouth every 12 (twelve) hours as needed for severe pain (pain score 7-10). 02/23/24  Yes Karl Barrett SAILOR, Barrett  ciclopirox  (PENLAC ) 8 % solution Apply topically at bedtime. Apply over nail and surrounding skin. Apply daily over previous coat. After seven (7) days, may remove with alcohol and continue cycle. Patient not taking: Reported on 03/06/2024 11/17/23   Karl Barrett   Allergies  Allergen Reactions   Nsaids     Barrett Esophagus      FAMILY HISTORY:  family history includes ADD / ADHD in his daughter and niece; Asperger's syndrome in his paternal uncle; Diabetes in his father; Heart disease in his father and mother; Hypertension in his mother; Lung cancer in his maternal grandfather; Pancreatic cancer in his paternal grandfather; Pulmonary fibrosis in his mother; Thyroid disease in his mother. SOCIAL HISTORY:  reports that he has been smoking cigarettes. He has a 35 pack-year smoking history. He has been exposed to tobacco smoke. He has never used smokeless tobacco. He reports that he does not currently use alcohol. He reports that he does not use drugs.   Review of Systems:  Gen:  Denies  fever, sweats, chills weight loss  HEENT: Denies blurred vision, double vision, ear pain, eye pain, hearing loss, nose bleeds, sore throat Cardiac:  No dizziness, chest pain or heaviness, chest tightness,edema, No JVD Resp:   No cough, -sputum production, -shortness of breath,-wheezing, -hemoptysis,  Gi: Denies swallowing difficulty, stomach pain, nausea or vomiting, diarrhea, constipation, bowel incontinence Gu:  Denies bladder incontinence, burning urine Ext:   Denies Joint pain, stiffness or swelling Skin: Denies  skin rash, easy bruising or bleeding or hives Endoc:  Denies polyuria, polydipsia , polyphagia or weight change Psych:   Denies depression, insomnia or hallucinations  Other:  All other systems negative  VITAL SIGNS: BP 122/72   Pulse 68   Temp 97.8 F (36.6 C)   Ht 6' 2 (1.88 m)   Wt 219 lb 9.6 oz (99.6 kg)   SpO2 96%   BMI 28.19 kg/m    Physical Examination:   General Appearance: No distress  EYES PERRLA, EOM intact.   NECK Supple, No JVD Pulmonary: normal breath sounds, No wheezing.  CardiovascularNormal S1,S2.  No m/r/g.   Abdomen: Benign, Soft, non-tender. Skin:   warm, no rashes, no ecchymosis  Extremities: normal, no cyanosis, clubbing. Neuro:without focal findings,  speech normal   PSYCHIATRIC: Mood, affect within normal limits.   ASSESSMENT AND PLAN  OSA Reviewed HST results with patient. Starting on APAP therapy set to 4-16 cm H2O. Discussed the consequences of untreated sleep apnea. Advised not to drive drowsy for safety of patient and others. Will follow up in 3 months.     HTN Stable, on current management. Following with PCP.    Patient  satisfied with Plan of action and management. All questions answered  I spent a total of 20 minutes reviewing chart data, face-to-face evaluation with the patient, counseling and coordination of care as detailed above.    Geneieve Duell, M.D.  Sleep Medicine [facility]

## 2024-07-12 ENCOUNTER — Ambulatory Visit: Admitting: Sleep Medicine

## 2024-10-11 ENCOUNTER — Ambulatory Visit: Admitting: Sleep Medicine
